# Patient Record
Sex: Female | Born: 1940 | Race: White | Hispanic: No | Marital: Married | Smoking: Never smoker
Health system: Southern US, Community
[De-identification: ages and names within clinical notes are randomized; demographics above are authoritative.]

## PROBLEM LIST (undated history)

## (undated) DIAGNOSIS — E079 Disorder of thyroid, unspecified: Secondary | ICD-10-CM

## (undated) DIAGNOSIS — R112 Nausea with vomiting, unspecified: Secondary | ICD-10-CM

## (undated) DIAGNOSIS — IMO0002 Reserved for concepts with insufficient information to code with codable children: Secondary | ICD-10-CM

## (undated) DIAGNOSIS — I10 Essential (primary) hypertension: Secondary | ICD-10-CM

## (undated) DIAGNOSIS — T884XXA Failed or difficult intubation, initial encounter: Secondary | ICD-10-CM

## (undated) DIAGNOSIS — I2510 Atherosclerotic heart disease of native coronary artery without angina pectoris: Secondary | ICD-10-CM

## (undated) DIAGNOSIS — M1990 Unspecified osteoarthritis, unspecified site: Secondary | ICD-10-CM

## (undated) DIAGNOSIS — N393 Stress incontinence (female) (male): Secondary | ICD-10-CM

## (undated) DIAGNOSIS — R7303 Prediabetes: Secondary | ICD-10-CM

## (undated) DIAGNOSIS — E039 Hypothyroidism, unspecified: Secondary | ICD-10-CM

## (undated) DIAGNOSIS — J449 Chronic obstructive pulmonary disease, unspecified: Secondary | ICD-10-CM

## (undated) DIAGNOSIS — K219 Gastro-esophageal reflux disease without esophagitis: Secondary | ICD-10-CM

## (undated) DIAGNOSIS — T8859XA Other complications of anesthesia, initial encounter: Secondary | ICD-10-CM

## (undated) HISTORY — PX: TUBAL LIGATION: SHX77

## (undated) HISTORY — PX: CHOLECYSTECTOMY: SHX55

## (undated) HISTORY — PX: ABDOMINAL HYSTERECTOMY: SHX81

## (undated) HISTORY — DX: Reserved for concepts with insufficient information to code with codable children: IMO0002

## (undated) HISTORY — PX: WRIST SURGERY: SHX841

## (undated) HISTORY — PX: HAND SURGERY: SHX662

## (undated) HISTORY — DX: Hypothyroidism, unspecified: E03.9

## (undated) HISTORY — PX: DILATION AND CURETTAGE OF UTERUS: SHX78

## (undated) HISTORY — PX: ARTHROSCOPY KNEE W/ DRILLING: SUR92

## (undated) HISTORY — DX: Stress incontinence (female) (male): N39.3

## (undated) HISTORY — PX: CATARACT EXTRACTION: SUR2

## (undated) HISTORY — DX: Prediabetes: R73.03

---

## 2001-10-27 ENCOUNTER — Encounter: Payer: Self-pay | Admitting: Family Medicine

## 2001-10-27 ENCOUNTER — Ambulatory Visit (HOSPITAL_COMMUNITY): Admission: RE | Admit: 2001-10-27 | Discharge: 2001-10-27 | Payer: Self-pay | Admitting: Family Medicine

## 2001-11-14 ENCOUNTER — Other Ambulatory Visit: Admission: RE | Admit: 2001-11-14 | Discharge: 2001-11-14 | Payer: Self-pay | Admitting: Obstetrics and Gynecology

## 2002-10-30 ENCOUNTER — Ambulatory Visit (HOSPITAL_COMMUNITY): Admission: RE | Admit: 2002-10-30 | Discharge: 2002-10-30 | Payer: Self-pay | Admitting: Obstetrics and Gynecology

## 2002-10-30 ENCOUNTER — Encounter: Payer: Self-pay | Admitting: Obstetrics and Gynecology

## 2003-06-06 ENCOUNTER — Encounter: Payer: Self-pay | Admitting: Family Medicine

## 2003-06-06 ENCOUNTER — Ambulatory Visit (HOSPITAL_COMMUNITY): Admission: RE | Admit: 2003-06-06 | Discharge: 2003-06-06 | Payer: Self-pay | Admitting: Family Medicine

## 2003-06-20 ENCOUNTER — Ambulatory Visit (HOSPITAL_COMMUNITY): Admission: RE | Admit: 2003-06-20 | Discharge: 2003-06-20 | Payer: Self-pay | Admitting: General Surgery

## 2003-09-14 ENCOUNTER — Ambulatory Visit (HOSPITAL_COMMUNITY): Admission: RE | Admit: 2003-09-14 | Discharge: 2003-09-14 | Payer: Self-pay | Admitting: Family Medicine

## 2003-11-02 ENCOUNTER — Ambulatory Visit (HOSPITAL_COMMUNITY): Admission: RE | Admit: 2003-11-02 | Discharge: 2003-11-02 | Payer: Self-pay | Admitting: Family Medicine

## 2004-12-17 ENCOUNTER — Ambulatory Visit (HOSPITAL_COMMUNITY): Admission: RE | Admit: 2004-12-17 | Discharge: 2004-12-17 | Payer: Self-pay | Admitting: Obstetrics and Gynecology

## 2005-07-06 ENCOUNTER — Ambulatory Visit (HOSPITAL_COMMUNITY): Admission: RE | Admit: 2005-07-06 | Discharge: 2005-07-06 | Payer: Self-pay | Admitting: Family Medicine

## 2005-12-22 ENCOUNTER — Ambulatory Visit (HOSPITAL_COMMUNITY): Admission: RE | Admit: 2005-12-22 | Discharge: 2005-12-22 | Payer: Self-pay | Admitting: Family Medicine

## 2006-05-03 ENCOUNTER — Ambulatory Visit (HOSPITAL_COMMUNITY): Admission: RE | Admit: 2006-05-03 | Discharge: 2006-05-03 | Payer: Self-pay | Admitting: Family Medicine

## 2006-12-27 ENCOUNTER — Ambulatory Visit (HOSPITAL_COMMUNITY): Admission: RE | Admit: 2006-12-27 | Discharge: 2006-12-27 | Payer: Self-pay | Admitting: Obstetrics and Gynecology

## 2007-11-15 ENCOUNTER — Ambulatory Visit (HOSPITAL_COMMUNITY): Admission: RE | Admit: 2007-11-15 | Discharge: 2007-11-15 | Payer: Self-pay | Admitting: Family Medicine

## 2007-12-28 ENCOUNTER — Ambulatory Visit (HOSPITAL_COMMUNITY): Admission: RE | Admit: 2007-12-28 | Discharge: 2007-12-28 | Payer: Self-pay | Admitting: Obstetrics and Gynecology

## 2008-12-28 ENCOUNTER — Ambulatory Visit (HOSPITAL_COMMUNITY): Admission: RE | Admit: 2008-12-28 | Discharge: 2008-12-28 | Payer: Self-pay | Admitting: Family Medicine

## 2009-12-30 ENCOUNTER — Ambulatory Visit (HOSPITAL_COMMUNITY): Admission: RE | Admit: 2009-12-30 | Discharge: 2009-12-30 | Payer: Self-pay | Admitting: Family Medicine

## 2010-04-04 ENCOUNTER — Ambulatory Visit (HOSPITAL_COMMUNITY): Admission: RE | Admit: 2010-04-04 | Discharge: 2010-04-04 | Payer: Self-pay | Admitting: Family Medicine

## 2010-09-29 ENCOUNTER — Ambulatory Visit (HOSPITAL_COMMUNITY)
Admission: RE | Admit: 2010-09-29 | Discharge: 2010-09-29 | Payer: Self-pay | Source: Home / Self Care | Attending: Family Medicine | Admitting: Family Medicine

## 2010-11-01 ENCOUNTER — Ambulatory Visit (HOSPITAL_COMMUNITY)
Admission: EM | Admit: 2010-11-01 | Discharge: 2010-11-01 | Disposition: A | Discharge status: Home / Self Care | Payer: Medicare Other | Source: Ambulatory Visit | Attending: Family Medicine | Admitting: Family Medicine

## 2010-11-01 ENCOUNTER — Other Ambulatory Visit (HOSPITAL_COMMUNITY): Payer: Self-pay | Admitting: Family Medicine

## 2010-11-01 ENCOUNTER — Ambulatory Visit (HOSPITAL_COMMUNITY)
Admission: RE | Admit: 2010-11-01 | Discharge: 2010-11-01 | Disposition: A | Discharge status: Home / Self Care | Payer: Medicare Other | Source: Ambulatory Visit | Admitting: Family Medicine

## 2010-11-01 DIAGNOSIS — W108XXA Fall (on) (from) other stairs and steps, initial encounter: Secondary | ICD-10-CM | POA: Insufficient documentation

## 2010-11-01 DIAGNOSIS — M8580 Other specified disorders of bone density and structure, unspecified site: Secondary | ICD-10-CM

## 2010-11-01 DIAGNOSIS — W19XXXA Unspecified fall, initial encounter: Secondary | ICD-10-CM

## 2010-11-01 DIAGNOSIS — S8990XA Unspecified injury of unspecified lower leg, initial encounter: Secondary | ICD-10-CM | POA: Insufficient documentation

## 2010-11-01 DIAGNOSIS — M25569 Pain in unspecified knee: Secondary | ICD-10-CM | POA: Insufficient documentation

## 2010-11-01 DIAGNOSIS — Z139 Encounter for screening, unspecified: Secondary | ICD-10-CM

## 2010-12-26 ENCOUNTER — Ambulatory Visit (HOSPITAL_COMMUNITY)
Admit: 2010-12-26 | Discharge: 2010-12-26 | Disposition: A | Discharge status: Home / Self Care | Payer: Medicare Other | Source: Ambulatory Visit | Attending: Obstetrics & Gynecology | Admitting: Obstetrics & Gynecology

## 2010-12-26 ENCOUNTER — Encounter (HOSPITAL_COMMUNITY): Payer: Self-pay

## 2010-12-26 DIAGNOSIS — M818 Other osteoporosis without current pathological fracture: Secondary | ICD-10-CM | POA: Insufficient documentation

## 2010-12-26 HISTORY — DX: Essential (primary) hypertension: I10

## 2011-01-02 ENCOUNTER — Ambulatory Visit (HOSPITAL_COMMUNITY)
Admit: 2011-01-02 | Discharge: 2011-01-02 | Disposition: A | Discharge status: Home / Self Care | Payer: Medicare Other | Source: Ambulatory Visit | Attending: Obstetrics and Gynecology | Admitting: Obstetrics and Gynecology

## 2011-01-02 DIAGNOSIS — Z1231 Encounter for screening mammogram for malignant neoplasm of breast: Secondary | ICD-10-CM | POA: Insufficient documentation

## 2011-01-23 NOTE — Procedures (Signed)
NAME:  April Lang, April Lang NO.:  [id number]   MEDICAL RECORD NO.:  [id number]          PATIENT TYPE:  OUT   LOCATION:  RAD                           FACILITY:  [facility]   PHYSICIAN:  Nicki Guadalajara, M.D.     DATE OF BIRTH:  June 03, 1941   DATE OF PROCEDURE:  05/03/2006  DATE OF DISCHARGE:  05/03/2006                                  ECHOCARDIOGRAM   INDICATION:  This study is performed in this 70 year old female to evaluate  systolic murmur.   1. Technically this was an adequate M-mode two-dimensional comprehensive      Doppler echocardiogram.  2. Left ventricular wall thickness was upper normal in size, measuring 1.1      cm.  Left ventricular end-diastolic and end-systolic dimensions were      normal at 4.6 and 2.9 cm, respectively.  Systolic and diastolic      function were normal.  Ejection fraction estimated is 55-60%.  There      were no segmental wall motion abnormalities.  3. Left atrium was upper normal at 3.9 cm.  Right atrium was normal.      Right ventricle had normal size and function.  4. Aortic root dimension was normal at 2.8 cm.  5. There was mild aortic valve thickening in a trileaflet valve with      minimal nodular sclerosis.  Peak instantaneous gradient was 8 mm.  Mean      gradient was 4 mm.  Estimated aortic valve are 2.3 sqcm.  This was felt      more compatible with aortic sclerosis rather than stenosis.  There was      no AR.  6. There was suggestion of minimal mitral annular calcification.  Mitral      valve closure was flat without frank prolapse.  There was no      significant MR.  7. Tricuspid valve appeared structurally normal.  8. Pulmonic valve appeared structurally normal.  9. There were no intramyocardial masses, thrombi or effusions seen.   IMPRESSION:  Technically this is an adequate echo Doppler study  demonstrating upper normal/borderline concentric left ventricular  hypertrophy with normal systolic and diastolic function.  There was  mild  focal nodular thickening of a trileaflet aortic valve without significant  stenosis or insufficiency.  There was minimal mitral annular calcification  with flat mitral valve closure without frank mitral valve prolapse or mitral  insufficiency.           ______________________________  Nicki Guadalajara, M.D.     TK/MEDQ  D:  05/04/2006  T:  05/05/2006  Job:  [id number]

## 2011-01-23 NOTE — Op Note (Signed)
NAME:  April Lang, April Lang                         ACCOUNT NO.:  [id number]   MEDICAL RECORD NO.:  [id number]                   PATIENT TYPE:  AMB   LOCATION:  DAY                                  FACILITY:  [facility]   PHYSICIAN:  Dalia Heading, M.D.               DATE OF BIRTH:  01/22/1941   DATE OF PROCEDURE:  06/20/2003  DATE OF DISCHARGE:                                 OPERATIVE REPORT   PREOPERATIVE DIAGNOSIS:  Cholecystitis and cholelithiasis.   POSTOPERATIVE DIAGNOSIS:  Cholecystitis and cholelithiasis.   PROCEDURE:  Laparoscopic cholecystectomy.   SURGEON:  Dalia Heading, M.D.   ASSISTANT:  Bernerd Limbo. Leona Carry, M.D.   ANESTHESIA:  General endotracheal.   INDICATIONS FOR PROCEDURE:  The patient is a 70 year old white female who  was referred for evaluation and treatment of biliary colic secondary to  cholelithiasis.  The risks and benefits of the procedure, including  bleeding, infection, hepatobiliary injury, and the possibility of an open  procedure were fully explained to the patient who gave informed consent.   DESCRIPTION OF PROCEDURE:  The patient was placed in the supine position.  After induction of general endotracheal anesthesia, the abdomen was prepped  and draped using the usual sterile technique with Betadine.  Surgical site  confirmation was performed.   A supraumbilical incision was made down to the fascia.  A Veress needle was  introduced into the abdominal cavity, and confirmation of placement was done  using the saline drop test.  The abdomen was then insufflated to 16 mmHg  pressure.  An 11-mm trocar was introduced into the abdominal cavity under  direct visualization without difficulty.  The patient was placed in the  reverse Trendelenburg position, and an additional 11-mm trocar was placed in  the epigastric region and 5-mm trocars were placed in the right upper  quadrant and right flank regions.  The liver was inspected and noted to be  within  normal limits.  The gallbladder was retracted superiorly and  laterally.  The dissection was begun around the infundibulum of the  gallbladder.  The cystic duct was first identified.  Its juncture to the  infundibulum was fully identified.  Endoclips were placed proximally and  distally on the cystic duct, and the cystic duct was divided.  This was  likewise done on the cystic artery.  The gallbladder was then freed away  from the gallbladder fossa using Bovie electrocautery.  The gallbladder was  delivered through the epigastric trocar site using an EndoCatch bag.  The  gallbladder fossa was inspected, and no abnormal bleeding or bile leakage  was noted.  Surgicel was placed in the gallbladder fossa.  The subhepatic  space as well as right hepatic gutter were evacuated of all fluid and air  prior to removal of the trocars.   All wounds were irrigated with normal saline.  All wounds were injected with  0.5% Sensorcaine.  The supraumbilical fascia was reapproximated using an 0  Vicryl interrupted suture.  All skin incisions were closed using staples.  Betadine ointment and dry sterile dressings were applied.   All tape and needle counts were correct at the end of the procedure.  The  patient was extubated in the operating room and went back to the recovery  room awake and in stable condition.   COMPLICATIONS:  None.   SPECIMENS:  Gallbladder.   ESTIMATED BLOOD LOSS:  Minimal.      ___________________________________________                                            Dalia Heading, M.D.   MAJ/MEDQ  D:  06/20/2003  T:  06/20/2003  Job:  [id number]   cc:   Patrica Duel, M.D.  [address]  [place]  [place] [address]  Fax: [phone]

## 2011-01-23 NOTE — H&P (Signed)
NAME:  April Lang, April Lang                         ACCOUNT NO.:  [id number]   MEDICAL RECORD NO.:  [id number]                   PATIENT TYPE:  OUT   LOCATION:  RAD                                  FACILITY:  [facility]   PHYSICIAN:  Dalia Heading, M.D.               DATE OF BIRTH:  [birth year]   DATE OF ADMISSION:  DATE OF DISCHARGE:                                HISTORY & PHYSICAL   CHIEF COMPLAINT:  Biliary colic secondary to cholelithiasis.   HISTORY OF PRESENT ILLNESS:  The patient is a 70 year old white female who  is referred for evaluation and treatment of biliary colic secondary to  cholecystitis.  She is known to have intermittent episodes of right flank  pain, postprandial discomfort after a fatty meal, and bloating for the past  two weeks.  She has had two episodes.  Some indigestion has been noted.  No  fever, chills, or jaundice have been noted.   PAST MEDICAL HISTORY:  1. Hypothyroidism.  2. Extrinsic allergies.   PAST SURGICAL HISTORY:  1. Hysterectomy.  2. Torn ligament in left arm.   CURRENT MEDICATIONS:  1. Premarin.  2. Synthroid.  3. Advair p.r.n.   ALLERGIES:  CODEINE.   REVIEW OF SYSTEMS:  The patient denies drinking or smoking.  She denies ever  experiencing chest pain, MI, leg swelling, CVA, diabetes mellitus.  She  denies any bleeding disorders.   PHYSICAL EXAMINATION:  GENERAL:  The patient is a well-developed, well-  nourished white female in no acute distress.  VITAL SIGNS:  She is afebrile, vital signs are stable.  HEENT:  No scleral icterus.  LUNGS:  Clear to auscultation with equal breath sounds bilaterally.  HEART:  Regular rate and rhythm without S3, S4, murmurs.  ABDOMEN:  Soft, nontender, nondistended, no hepatosplenomegaly, masses, or  hernias are identified.   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  The  common bile duct is in the upper limits of normal.  Liver enzyme tests are  within normal limits.   IMPRESSION:  1. Biliary colic.  2. Cholelithiasis.   PLAN:  The patient is scheduled for a laparoscopic cholecystectomy on  June 20, 2003.  The risks and benefits of the procedure including  bleeding, infection, hepatobiliary injury, the possible need for an open  procedure were fully explained to the patient, who gave informed consent.  She was placed on a Zithromax Dosepak due to a sore throat and minimal  cough.     ___________________________________________                                         Dalia Heading, M.D.   MAJ/MEDQ  D:  06/12/2003  T:  06/12/2003  Job:  [id number]   cc:   Patrica Duel, M.D.  [address]  [place]  [place] [address]  Fax: [phone]

## 2011-05-19 ENCOUNTER — Encounter (HOSPITAL_COMMUNITY): Payer: Self-pay | Admitting: Emergency Medicine

## 2011-05-19 ENCOUNTER — Emergency Department (HOSPITAL_COMMUNITY)
Admission: EM | Admit: 2011-05-19 | Discharge: 2011-05-19 | Disposition: A | Discharge status: Home / Self Care | Payer: Medicare Other | Attending: Emergency Medicine | Admitting: Emergency Medicine

## 2011-05-19 DIAGNOSIS — T6391XA Toxic effect of contact with unspecified venomous animal, accidental (unintentional), initial encounter: Secondary | ICD-10-CM | POA: Insufficient documentation

## 2011-05-19 DIAGNOSIS — I10 Essential (primary) hypertension: Secondary | ICD-10-CM | POA: Insufficient documentation

## 2011-05-19 DIAGNOSIS — J4489 Other specified chronic obstructive pulmonary disease: Secondary | ICD-10-CM | POA: Insufficient documentation

## 2011-05-19 DIAGNOSIS — T63441A Toxic effect of venom of bees, accidental (unintentional), initial encounter: Secondary | ICD-10-CM

## 2011-05-19 DIAGNOSIS — T63461A Toxic effect of venom of wasps, accidental (unintentional), initial encounter: Secondary | ICD-10-CM | POA: Insufficient documentation

## 2011-05-19 DIAGNOSIS — J449 Chronic obstructive pulmonary disease, unspecified: Secondary | ICD-10-CM | POA: Insufficient documentation

## 2011-05-19 HISTORY — DX: Unspecified osteoarthritis, unspecified site: M19.90

## 2011-05-19 HISTORY — DX: Chronic obstructive pulmonary disease, unspecified: J44.9

## 2011-05-19 HISTORY — DX: Disorder of thyroid, unspecified: E07.9

## 2011-05-19 MED ORDER — PREDNISONE 10 MG PO TABS: 20.0000 mg | ORAL_TABLET | Freq: Every day | ORAL | Status: AC

## 2011-05-19 MED ORDER — METHYLPREDNISOLONE SODIUM SUCC 125 MG IJ SOLR
125.0000 mg | Freq: Once | INTRAMUSCULAR | Status: AC
  Administered 2011-05-19: 125 mg via INTRAVENOUS

## 2011-05-19 MED ORDER — SODIUM CHLORIDE 0.9 % IV SOLN
Freq: Once | INTRAVENOUS | Status: AC
  Administered 2011-05-19: 20 mL via INTRAVENOUS

## 2011-05-19 NOTE — ED Provider Notes (Addendum)
History   Chart scribed for Hilario Quarry, MD by Enos Fling; the patient was seen in room [facility]; this patient's care was started at 2:10 PM.    CSN: [id number] Arrival date & time: 05/19/2011  1:54 PM  Chief Complaint  Patient presents with  . Insect Bite   HPI April Lang is a 70 y.o. female who presents to the Emergency Department complaining of bee sting. Pt reports bee sting to left forearm just pta; c/o redness and swelling to area. She took benadryl immediately with improvement of symptoms. Thought possible sob so took a dose of her advair (has for asthma) with complete relief. No sob currently. No throat, lip, or tongue swelling. No chest tightness. Pt reports h/o allergic reactions to insect bites resulting in large welt to bite area but no other reaction.    Past Medical History  Diagnosis Date  . Hypertension   . Arthritis   . Asthma   . COPD (chronic obstructive pulmonary disease)   . Thyroid disease     Past Surgical History  Procedure Date  . Cholecystectomy   . Abdominal hysterectomy     History reviewed. No pertinent family history.  History  Substance Use Topics  . Smoking status: Never Smoker   . Smokeless tobacco: Not on file  . Alcohol Use: No    OB History    Grav Para Term Preterm Abortions TAB SAB Ect Mult Living                 Previous Medications   No medications on file     Allergies as of 05/19/2011 - Review Complete 12/26/2010  Allergen Reaction Noted  . Codeine  12/26/2010      Review of Systems  Constitutional: Negative for diaphoresis.  HENT: Negative for facial swelling and trouble swallowing.   Eyes: Negative for itching.  Respiratory: Negative for chest tightness, shortness of breath and wheezing.   Cardiovascular: Negative for palpitations.  Skin: Positive for rash.    Physical Exam  BP 174/67  Pulse 66  Temp(Src) 97.7 F (36.5 C) (Oral)  Resp 20  Ht 5\' 5"  (1.651 m)  Wt 165 lb (74.844 kg)  BMI  27.46 kg/m2  SpO2 100%  Physical Exam  Constitutional: She is oriented to person, place, and time. She appears well-developed and well-nourished. No distress.  HENT:  Head: Normocephalic and atraumatic.  Mouth/Throat: Oropharynx is clear and moist.  Eyes: Conjunctivae are normal.  Neck: Normal range of motion. Neck supple.  Cardiovascular: Normal rate.   Pulmonary/Chest: Effort normal and breath sounds normal. No respiratory distress. She has no wheezes.  Musculoskeletal: Normal range of motion.  Neurological: She is alert and oriented to person, place, and time.  Skin: Skin is warm and dry. Rash (area of induration to left anterior forearm; ice pack resting on area) noted.  Psychiatric: She has a normal mood and affect.    ED Course  Procedures  OTHER DATA REVIEWED: Nursing notes and vital signs reviewed.   ED COURSE: 2:39 PM - swelling and redness improved after meds; pt feels much better   MDM: Patient has not had prior anaphylactic reaction. She has had large localized reactions in the past. She's not having any difficulty swallowing or breathing. She feels improved here after having Solu-Medrol and Benadryl. She will be discharged to home with instructions return or she has any difficulty swallowing or breathing.   IMPRESSION: No diagnosis found.   PLAN: Discharge All results reviewed and discussed  with pt, questions answered, pt agreeable with plan.   CONDITION ON DISCHARGE: Improved   MEDS GIVEN IN ED: Medications - No data to display    SCRIBE ATTESTATION: I personally performed the services described in this documentation, which was scribed in my presence. The recorded information has been reviewed and considered.   Hilario Quarry, MD 05/19/11 1459  Hilario Quarry, MD 05/20/11 [id number]

## 2011-05-19 NOTE — ED Notes (Signed)
Reddened edematous area along mid left forearm where pt. States she was stung by a yellow jacket in the past hour--also a second raised area (smaller) along left a/c where she states she sustained a 2nd sting--History for reaction to stings in the past requiring medical attention and injections.  C/o feeling SOA--Lungs CTA---no cyanosis--no air hunger, no nasal flaring

## 2011-05-19 NOTE — ED Notes (Signed)
Reports feeling much improved and no SOA.

## 2011-05-19 NOTE — ED Notes (Signed)
I.V. Removed right a/c---intact and no redness or swelling along site.  Sterile band-aid to site.

## 2011-05-19 NOTE — ED Notes (Signed)
ERROR---IV of N.S. And Solu Medrol 125 MG IVP were given at 13:55 as an emergency order

## 2011-05-19 NOTE — ED Notes (Signed)
C/o Yellow Jacket sting X's to left forearm about one hour prior to arrival.  C/o feeling SOA

## 2011-11-17 DIAGNOSIS — E039 Hypothyroidism, unspecified: Secondary | ICD-10-CM | POA: Diagnosis not present

## 2011-11-17 DIAGNOSIS — I10 Essential (primary) hypertension: Secondary | ICD-10-CM | POA: Diagnosis not present

## 2011-11-17 DIAGNOSIS — Z79899 Other long term (current) drug therapy: Secondary | ICD-10-CM | POA: Diagnosis not present

## 2011-11-17 DIAGNOSIS — Z6827 Body mass index (BMI) 27.0-27.9, adult: Secondary | ICD-10-CM | POA: Diagnosis not present

## 2011-11-17 DIAGNOSIS — R5383 Other fatigue: Secondary | ICD-10-CM | POA: Diagnosis not present

## 2011-11-17 DIAGNOSIS — M159 Polyosteoarthritis, unspecified: Secondary | ICD-10-CM | POA: Diagnosis not present

## 2011-11-23 ENCOUNTER — Other Ambulatory Visit (HOSPITAL_COMMUNITY): Payer: Self-pay | Admitting: Family Medicine

## 2011-11-23 DIAGNOSIS — Z139 Encounter for screening, unspecified: Secondary | ICD-10-CM

## 2011-12-30 DIAGNOSIS — M545 Low back pain: Secondary | ICD-10-CM | POA: Diagnosis not present

## 2012-01-04 ENCOUNTER — Ambulatory Visit (HOSPITAL_COMMUNITY)
Admission: RE | Admit: 2012-01-04 | Discharge: 2012-01-04 | Disposition: A | Discharge status: Home / Self Care | Payer: Medicare Other | Source: Ambulatory Visit | Attending: Family Medicine | Admitting: Family Medicine

## 2012-01-04 DIAGNOSIS — Z1231 Encounter for screening mammogram for malignant neoplasm of breast: Secondary | ICD-10-CM | POA: Diagnosis not present

## 2012-01-04 DIAGNOSIS — Z139 Encounter for screening, unspecified: Secondary | ICD-10-CM

## 2012-02-19 DIAGNOSIS — M7710 Lateral epicondylitis, unspecified elbow: Secondary | ICD-10-CM | POA: Diagnosis not present

## 2012-04-18 DIAGNOSIS — E039 Hypothyroidism, unspecified: Secondary | ICD-10-CM | POA: Diagnosis not present

## 2012-04-18 DIAGNOSIS — I10 Essential (primary) hypertension: Secondary | ICD-10-CM | POA: Diagnosis not present

## 2012-04-18 DIAGNOSIS — R5381 Other malaise: Secondary | ICD-10-CM | POA: Diagnosis not present

## 2012-04-18 DIAGNOSIS — Z6828 Body mass index (BMI) 28.0-28.9, adult: Secondary | ICD-10-CM | POA: Diagnosis not present

## 2012-04-21 DIAGNOSIS — R5381 Other malaise: Secondary | ICD-10-CM | POA: Diagnosis not present

## 2012-05-05 DIAGNOSIS — E782 Mixed hyperlipidemia: Secondary | ICD-10-CM | POA: Diagnosis not present

## 2012-05-05 DIAGNOSIS — R079 Chest pain, unspecified: Secondary | ICD-10-CM | POA: Diagnosis not present

## 2012-05-05 DIAGNOSIS — I10 Essential (primary) hypertension: Secondary | ICD-10-CM | POA: Diagnosis not present

## 2012-05-05 DIAGNOSIS — R0989 Other specified symptoms and signs involving the circulatory and respiratory systems: Secondary | ICD-10-CM | POA: Diagnosis not present

## 2012-05-20 DIAGNOSIS — L259 Unspecified contact dermatitis, unspecified cause: Secondary | ICD-10-CM | POA: Diagnosis not present

## 2012-05-20 DIAGNOSIS — Z6827 Body mass index (BMI) 27.0-27.9, adult: Secondary | ICD-10-CM | POA: Diagnosis not present

## 2012-05-23 DIAGNOSIS — R0602 Shortness of breath: Secondary | ICD-10-CM | POA: Diagnosis not present

## 2012-05-23 DIAGNOSIS — E782 Mixed hyperlipidemia: Secondary | ICD-10-CM | POA: Diagnosis not present

## 2012-05-23 DIAGNOSIS — Z79899 Other long term (current) drug therapy: Secondary | ICD-10-CM | POA: Diagnosis not present

## 2012-05-27 DIAGNOSIS — R0602 Shortness of breath: Secondary | ICD-10-CM | POA: Diagnosis not present

## 2012-05-27 DIAGNOSIS — I10 Essential (primary) hypertension: Secondary | ICD-10-CM | POA: Diagnosis not present

## 2012-05-27 DIAGNOSIS — E782 Mixed hyperlipidemia: Secondary | ICD-10-CM | POA: Diagnosis not present

## 2012-05-30 DIAGNOSIS — Z6827 Body mass index (BMI) 27.0-27.9, adult: Secondary | ICD-10-CM | POA: Diagnosis not present

## 2012-05-30 DIAGNOSIS — I10 Essential (primary) hypertension: Secondary | ICD-10-CM | POA: Diagnosis not present

## 2012-05-30 DIAGNOSIS — L259 Unspecified contact dermatitis, unspecified cause: Secondary | ICD-10-CM | POA: Diagnosis not present

## 2012-05-30 DIAGNOSIS — E785 Hyperlipidemia, unspecified: Secondary | ICD-10-CM | POA: Diagnosis not present

## 2012-05-30 DIAGNOSIS — Z23 Encounter for immunization: Secondary | ICD-10-CM | POA: Diagnosis not present

## 2012-06-27 DIAGNOSIS — J069 Acute upper respiratory infection, unspecified: Secondary | ICD-10-CM | POA: Diagnosis not present

## 2012-06-27 DIAGNOSIS — J209 Acute bronchitis, unspecified: Secondary | ICD-10-CM | POA: Diagnosis not present

## 2012-06-27 DIAGNOSIS — J45909 Unspecified asthma, uncomplicated: Secondary | ICD-10-CM | POA: Diagnosis not present

## 2012-07-06 DIAGNOSIS — E782 Mixed hyperlipidemia: Secondary | ICD-10-CM | POA: Diagnosis not present

## 2012-07-06 DIAGNOSIS — Z79899 Other long term (current) drug therapy: Secondary | ICD-10-CM | POA: Diagnosis not present

## 2012-09-16 DIAGNOSIS — E785 Hyperlipidemia, unspecified: Secondary | ICD-10-CM | POA: Diagnosis not present

## 2012-09-16 DIAGNOSIS — E039 Hypothyroidism, unspecified: Secondary | ICD-10-CM | POA: Diagnosis not present

## 2012-09-16 DIAGNOSIS — J0100 Acute maxillary sinusitis, unspecified: Secondary | ICD-10-CM | POA: Diagnosis not present

## 2012-09-16 DIAGNOSIS — J209 Acute bronchitis, unspecified: Secondary | ICD-10-CM | POA: Diagnosis not present

## 2012-09-19 DIAGNOSIS — E038 Other specified hypothyroidism: Secondary | ICD-10-CM | POA: Diagnosis not present

## 2012-09-30 DIAGNOSIS — I10 Essential (primary) hypertension: Secondary | ICD-10-CM | POA: Diagnosis not present

## 2012-09-30 DIAGNOSIS — J209 Acute bronchitis, unspecified: Secondary | ICD-10-CM | POA: Diagnosis not present

## 2012-09-30 DIAGNOSIS — Z6828 Body mass index (BMI) 28.0-28.9, adult: Secondary | ICD-10-CM | POA: Diagnosis not present

## 2012-10-17 DIAGNOSIS — R0602 Shortness of breath: Secondary | ICD-10-CM | POA: Diagnosis not present

## 2012-10-17 DIAGNOSIS — I10 Essential (primary) hypertension: Secondary | ICD-10-CM | POA: Diagnosis not present

## 2012-10-17 DIAGNOSIS — E782 Mixed hyperlipidemia: Secondary | ICD-10-CM | POA: Diagnosis not present

## 2012-11-30 ENCOUNTER — Other Ambulatory Visit (HOSPITAL_COMMUNITY): Payer: Self-pay | Admitting: Internal Medicine

## 2012-11-30 ENCOUNTER — Ambulatory Visit (HOSPITAL_COMMUNITY)
Admission: RE | Admit: 2012-11-30 | Discharge: 2012-11-30 | Disposition: A | Discharge status: Home / Self Care | Payer: Medicare Other | Source: Ambulatory Visit | Attending: Internal Medicine | Admitting: Internal Medicine

## 2012-11-30 DIAGNOSIS — R059 Cough, unspecified: Secondary | ICD-10-CM | POA: Insufficient documentation

## 2012-11-30 DIAGNOSIS — R05 Cough: Secondary | ICD-10-CM

## 2012-11-30 DIAGNOSIS — E039 Hypothyroidism, unspecified: Secondary | ICD-10-CM | POA: Diagnosis not present

## 2012-11-30 DIAGNOSIS — J209 Acute bronchitis, unspecified: Secondary | ICD-10-CM | POA: Diagnosis not present

## 2012-11-30 DIAGNOSIS — Z6829 Body mass index (BMI) 29.0-29.9, adult: Secondary | ICD-10-CM | POA: Diagnosis not present

## 2012-11-30 DIAGNOSIS — I10 Essential (primary) hypertension: Secondary | ICD-10-CM | POA: Diagnosis not present

## 2012-12-19 ENCOUNTER — Other Ambulatory Visit (HOSPITAL_COMMUNITY): Payer: Self-pay | Admitting: Internal Medicine

## 2012-12-19 DIAGNOSIS — Z139 Encounter for screening, unspecified: Secondary | ICD-10-CM

## 2013-01-05 ENCOUNTER — Ambulatory Visit (HOSPITAL_COMMUNITY)
Admission: RE | Admit: 2013-01-05 | Discharge: 2013-01-05 | Disposition: A | Discharge status: Home / Self Care | Payer: Medicare Other | Source: Ambulatory Visit | Attending: Internal Medicine | Admitting: Internal Medicine

## 2013-01-05 DIAGNOSIS — Z1231 Encounter for screening mammogram for malignant neoplasm of breast: Secondary | ICD-10-CM | POA: Insufficient documentation

## 2013-01-05 DIAGNOSIS — Z139 Encounter for screening, unspecified: Secondary | ICD-10-CM

## 2013-01-06 DIAGNOSIS — H04129 Dry eye syndrome of unspecified lacrimal gland: Secondary | ICD-10-CM | POA: Diagnosis not present

## 2013-01-06 DIAGNOSIS — Z961 Presence of intraocular lens: Secondary | ICD-10-CM | POA: Diagnosis not present

## 2013-01-06 DIAGNOSIS — H18419 Arcus senilis, unspecified eye: Secondary | ICD-10-CM | POA: Diagnosis not present

## 2013-01-18 ENCOUNTER — Encounter: Payer: Self-pay | Admitting: *Deleted

## 2013-01-18 DIAGNOSIS — I10 Essential (primary) hypertension: Secondary | ICD-10-CM | POA: Insufficient documentation

## 2013-01-18 DIAGNOSIS — J45909 Unspecified asthma, uncomplicated: Secondary | ICD-10-CM | POA: Insufficient documentation

## 2013-01-19 ENCOUNTER — Ambulatory Visit (INDEPENDENT_AMBULATORY_CARE_PROVIDER_SITE_OTHER): Payer: Medicare Other | Admitting: Adult Health

## 2013-01-19 ENCOUNTER — Encounter: Payer: Self-pay | Admitting: Adult Health

## 2013-01-19 ENCOUNTER — Other Ambulatory Visit: Payer: Self-pay | Admitting: *Deleted

## 2013-01-19 VITALS — BP 140/82 | HR 78 | Ht 64.0 in | Wt 164.0 lb

## 2013-01-19 DIAGNOSIS — I10 Essential (primary) hypertension: Secondary | ICD-10-CM

## 2013-01-19 DIAGNOSIS — Z01419 Encounter for gynecological examination (general) (routine) without abnormal findings: Secondary | ICD-10-CM | POA: Diagnosis not present

## 2013-01-19 DIAGNOSIS — IMO0002 Reserved for concepts with insufficient information to code with codable children: Secondary | ICD-10-CM

## 2013-01-19 DIAGNOSIS — E039 Hypothyroidism, unspecified: Secondary | ICD-10-CM | POA: Diagnosis not present

## 2013-01-19 DIAGNOSIS — N393 Stress incontinence (female) (male): Secondary | ICD-10-CM

## 2013-01-19 DIAGNOSIS — Z1212 Encounter for screening for malignant neoplasm of rectum: Secondary | ICD-10-CM

## 2013-01-19 HISTORY — DX: Reserved for concepts with insufficient information to code with codable children: IMO0002

## 2013-01-19 HISTORY — DX: Stress incontinence (female) (male): N39.3

## 2013-01-19 LAB — POCT URINALYSIS DIPSTICK
Blood, UA: NEGATIVE
Leukocytes, UA: NEGATIVE
Nitrite, UA: NEGATIVE
Protein, UA: NEGATIVE

## 2013-01-19 LAB — T3 UPTAKE: T3 Uptake: 37.5 % — ABNORMAL HIGH (ref 22.5–37.0)

## 2013-01-19 LAB — HEMOCCULT GUIAC POC 1CARD (OFFICE)

## 2013-01-19 LAB — T4, FREE: Free T4: 1.89 ng/dL — ABNORMAL HIGH (ref 0.80–1.80)

## 2013-01-19 MED ORDER — HYDROCHLOROTHIAZIDE 25 MG PO TABS: 25.0000 mg | ORAL_TABLET | Freq: Every day | ORAL | Status: DC

## 2013-01-19 NOTE — Progress Notes (Signed)
Patient ID: April Lang, female   DOB: 1940/11/04, 72 y.o.   MRN: [id number] History of Present Illness: April Lang is a 72 year old white female in for her physical. She is having SUI.  Current Medications, Allergies, Past Medical History, Past Surgical History, Family History and Social History were reviewed in [name] record.    Review of Systems: Patient denies any headaches, blurred vision, chest pain, abdominal pain, problems with bowel movements.She has had shortness of breath and saw Dr. Allyson Lang and has increased left atrial filling pressures. She is complaining of SUI and pressure, and feels bloated. Has some stress, looking after uncle in nursing home.  Physical Exam:Blood pressure 140/82, pulse 78, height 5\' 4"  (1.626 m), weight 164 lb (74.39 kg). Urine was negative. General:  Well developed, well nourished, no acute distress Skin:  Warm and dry Neck:  Midline trachea, normal thyroid, no carotid bruits heard Lungs; Clear to auscultation bilaterally Breast:  No dominant palpable mass, retraction, or nipple discharge Cardiovascular: Regular rate and rhythm Abdomen:  Soft, non tender, no hepatosplenomegaly Pelvic:  External genitalia is normal in appearance for age.  The vagina is atrophic. The cervix and uterus are absent, and she has a cystocele.  No adnexal masses or tenderness noted. Rectal: Good sphincter tone, no polyps, or hemorrhoids felt.  Hemoccult negative. Has some redness and a raw spot on left cheek from her pad rubbing and moisture changes Extremities:  No swelling or varicosities noted Psych: Alert and cooperative, seems in good mood  Impression: Yearly exam, no pap Cystocele SUI Hypothyroidism Hypertension Increased left atrial filling pressures  Plan: Check TSH and thyroid profile at her request Review handouts on cystocele and SUI Return in 1-2 weeks to see April Lang regarding SUI and cystocele Physical in 1 -2 years Use zinc oxide  for moisture changes on bottom

## 2013-01-19 NOTE — Patient Instructions (Addendum)
Review handouts  Physical in 1 -2 years Sign up for my chart Return in 1-2 weeks to see Dr Despina Hidden

## 2013-01-23 ENCOUNTER — Telehealth: Payer: Self-pay | Admitting: Adult Health

## 2013-01-23 NOTE — Telephone Encounter (Signed)
Wants labs sent to Dr. Sherwood Gambler and not Dr. Phillips Odor.

## 2013-01-31 ENCOUNTER — Ambulatory Visit (INDEPENDENT_AMBULATORY_CARE_PROVIDER_SITE_OTHER): Payer: Medicare Other | Admitting: Obstetrics & Gynecology

## 2013-01-31 ENCOUNTER — Encounter: Payer: Self-pay | Admitting: Obstetrics & Gynecology

## 2013-01-31 VITALS — BP 130/80 | Temp 98.1°F | Ht 64.0 in | Wt 165.0 lb

## 2013-01-31 DIAGNOSIS — N3946 Mixed incontinence: Secondary | ICD-10-CM | POA: Diagnosis not present

## 2013-01-31 DIAGNOSIS — R319 Hematuria, unspecified: Secondary | ICD-10-CM | POA: Diagnosis not present

## 2013-01-31 DIAGNOSIS — N898 Other specified noninflammatory disorders of vagina: Secondary | ICD-10-CM

## 2013-01-31 MED ORDER — MIRABEGRON ER 25 MG PO TB24: 25.0000 mg | ORAL_TABLET | Freq: Every day | ORAL | Status: DC

## 2013-01-31 NOTE — Addendum Note (Signed)
Addended by: Richardson Chiquito on: 01/31/2013 03:41 PM   Modules accepted: Orders

## 2013-01-31 NOTE — Addendum Note (Signed)
Addended by: Richardson Chiquito on: 01/31/2013 03:46 PM   Modules accepted: Orders

## 2013-01-31 NOTE — Progress Notes (Signed)
Patient ID: April Lang, female   DOB: August 24, 1941, 72 y.o.   MRN: [id number] Previous visit noted.  Patient has mixed incontinence much worse in last 3-4 months. 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

## 2013-02-02 ENCOUNTER — Telehealth: Payer: Self-pay | Admitting: Obstetrics & Gynecology

## 2013-02-02 LAB — URINE CULTURE: Colony Count: >=100000

## 2013-02-02 MED ORDER — CIPROFLOXACIN HCL 500 MG PO TABS: 500.0000 mg | ORAL_TABLET | Freq: Two times a day (BID) | ORAL | Status: DC

## 2013-02-02 NOTE — Telephone Encounter (Signed)
+  E coli   Cipro e prescribed

## 2013-02-28 ENCOUNTER — Ambulatory Visit (INDEPENDENT_AMBULATORY_CARE_PROVIDER_SITE_OTHER): Payer: Medicare Other | Admitting: Obstetrics & Gynecology

## 2013-02-28 ENCOUNTER — Encounter: Payer: Self-pay | Admitting: Obstetrics & Gynecology

## 2013-02-28 VITALS — BP 150/70 | Wt 166.0 lb

## 2013-02-28 DIAGNOSIS — N390 Urinary tract infection, site not specified: Secondary | ICD-10-CM | POA: Diagnosis not present

## 2013-02-28 DIAGNOSIS — N3946 Mixed incontinence: Secondary | ICD-10-CM | POA: Insufficient documentation

## 2013-02-28 DIAGNOSIS — N3941 Urge incontinence: Secondary | ICD-10-CM | POA: Diagnosis not present

## 2013-02-28 LAB — POCT URINALYSIS DIPSTICK
Ketones, UA: NEGATIVE
Protein, UA: NEGATIVE

## 2013-02-28 NOTE — Progress Notes (Signed)
Patient ID: April Lang, female   DOB: 14-Nov-1940, 72 y.o.   MRN: [id number] That is in for a followup for her appointment last month I treated her with Cipro for an Escherichia coli urinary tract infection Also placed her on myrbetriq 25 mg each bedtime for her urge incontinence She has mixed incontinence with significant  stress urinary component as well  She is responded favorably to the myrbetriq and will continue that medication She is not having any complications or untoward effects  We will see her back in 6 months for followup or prior to that should she have any problems

## 2013-02-28 NOTE — Patient Instructions (Addendum)
Urinary Incontinence Your doctor wants you to have this information about urinary incontinence. This is the inability to keep urine in your body until you decide to release it. CAUSES  Prostate gland enlargement is a common cause of urinary incontinence. But there are many different causes for losing urinary control. They include:  Medicines.  Infections.  Prostate problems.  Surgery.  Neurological diseases.  Emotional factors. DIAGNOSIS  Evaluating the cause of incontinence is important in choosing the best treatment. This may require:  An ultrasound exam.  Kidney and bladder X-rays.  Cystoscopy. This is an exam of the bladder using a narrow scope. TREATMENT  For incontinent patients, normal daily hygiene and using changing pads or adult diapers regularly will prevent offensive odors and skin damage from the moisture. Changing your medicines may help control incontinence. Your caregiver may prescribe some medicines to help you regain control. Avoid caffeine. It can over-stimulate the bladder. Use the bathroom regularly. Try about every 2 to 3 hours even if you do not feel the need. Take time to empty your bladder completely. After urinating, wait a minute. Then try to urinate again. External devices used to catch urine or an indwelling urine catheter (Foley catheter) may be needed as well. Some prostate gland problems require surgery to correct. Call your caregiver for more information. Document Released: 10/01/2004 Document Revised: 11/16/2011 Document Reviewed: 09/26/2008 [employer] Patient Information 2014 [employer].  

## 2013-03-03 ENCOUNTER — Encounter: Payer: Self-pay | Admitting: Adult Health

## 2013-03-28 ENCOUNTER — Telehealth: Payer: Self-pay

## 2013-03-28 NOTE — Telephone Encounter (Signed)
CALL PT TO MAKE SURE ,PICK UP MEDICATION. PT DID PICK UP MEDICATION.

## 2013-04-12 ENCOUNTER — Other Ambulatory Visit: Payer: Self-pay

## 2013-04-21 ENCOUNTER — Encounter: Payer: Self-pay | Admitting: Cardiovascular Disease

## 2013-04-24 ENCOUNTER — Ambulatory Visit (INDEPENDENT_AMBULATORY_CARE_PROVIDER_SITE_OTHER): Payer: Medicare Other | Admitting: Cardiovascular Disease

## 2013-04-24 ENCOUNTER — Encounter: Payer: Self-pay | Admitting: Cardiovascular Disease

## 2013-04-24 VITALS — BP 146/60 | HR 63 | Ht 64.0 in | Wt 163.1 lb

## 2013-04-24 DIAGNOSIS — I10 Essential (primary) hypertension: Secondary | ICD-10-CM | POA: Diagnosis not present

## 2013-04-24 MED ORDER — LOSARTAN POTASSIUM 50 MG PO TABS: 50.0000 mg | ORAL_TABLET | Freq: Every day | ORAL | Status: DC

## 2013-04-24 NOTE — Assessment & Plan Note (Signed)
Well-controlled on current medications 

## 2013-04-24 NOTE — Progress Notes (Signed)
   04/24/2013 April Lang   [birth year]  [id number]  Primary Physician Cassell Smiles., MD Primary Cardiologist: Runell Gess MD April Lang   HPI:  The patient returns today for followup. I last saw her 6 months ago for dyspnea. She had a MET test that was negative for ischemia, an echo that showed increased filling pressures. I began her on a diuretic to which she has had a variable response. She does have an ACE cough. A recent lipid profile showed a total cholesterol of 210 with LDL 111, HDL of 71.since I saw her back in the office 10/17/12 she denies chest pain or shortness of breath.      Current Outpatient Prescriptions  Medication Sig Dispense Refill  . acetaminophen (TYLENOL) 500 MG tablet Take 500 mg by mouth as needed for pain.      . hydrochlorothiazide (HYDRODIURIL) 25 MG tablet Take 1 tablet (25 mg total) by mouth daily.  90 tablet  1  . levothyroxine (SYNTHROID, LEVOTHROID) 100 MCG tablet Take 100 mcg by mouth daily before breakfast.      . losartan (COZAAR) 50 MG tablet Take 25-50 mg by mouth daily.       No current facility-administered medications for this visit.    Allergies  Allergen Reactions  . Codeine   . Lisinopril     Cough    History   Social History  . Marital Status: Married    Spouse Name: N/A    Number of Children: N/A  . Years of Education: N/A   Occupational History  . Not on file.   Social History Main Topics  . Smoking status: Never Smoker   . Smokeless tobacco: Never Used  . Alcohol Use: No  . Drug Use: No  . Sexual Activity: Not Currently    Birth Control/ Protection: Surgical   Other Topics Concern  . Not on file   Social History Narrative  . No narrative on file     Review of Systems: General: negative for chills, fever, night sweats or weight changes.  Cardiovascular: negative for chest pain, dyspnea on exertion, edema, orthopnea, palpitations, paroxysmal nocturnal dyspnea or shortness of  breath Dermatological: negative for rash Respiratory: negative for cough or wheezing Urologic: negative for hematuria Abdominal: negative for nausea, vomiting, diarrhea, bright red blood per rectum, melena, or hematemesis Neurologic: negative for visual changes, syncope, or dizziness All other systems reviewed and are otherwise negative except as noted above.    Blood pressure 146/60, pulse 63, height 5\' 4"  (1.626 m), weight 163 lb 1.6 oz (73.982 kg).  General appearance: alert and no distress Neck: no adenopathy, no carotid bruit, no JVD, supple, symmetrical, trachea midline and thyroid not enlarged, symmetric, no tenderness/mass/nodules Lungs: clear to auscultation bilaterally Heart: regular rate and rhythm, S1, S2 normal, no murmur, click, rub or gallop Extremities: extremities normal, atraumatic, no cyanosis or edema  EKG normal sinus rhythm at 63 without ST or T wave changes  ASSESSMENT AND PLAN:   HTN (hypertension) Well-controlled on current medications      Runell Gess MD [facility], [facility] 04/24/2013 12:57 PM

## 2013-04-24 NOTE — Patient Instructions (Addendum)
Your physician wants you to follow-up in: 6 months with an extender and 12 months with Dr Berry. You will receive a reminder letter in the mail two months in advance. If you don't receive a letter, please call our office to schedule the follow-up appointment.  

## 2013-05-13 DIAGNOSIS — Z23 Encounter for immunization: Secondary | ICD-10-CM | POA: Diagnosis not present

## 2013-05-19 DIAGNOSIS — L259 Unspecified contact dermatitis, unspecified cause: Secondary | ICD-10-CM | POA: Diagnosis not present

## 2013-06-13 DIAGNOSIS — J984 Other disorders of lung: Secondary | ICD-10-CM | POA: Diagnosis not present

## 2013-06-13 DIAGNOSIS — Z6827 Body mass index (BMI) 27.0-27.9, adult: Secondary | ICD-10-CM | POA: Diagnosis not present

## 2013-06-13 DIAGNOSIS — J45909 Unspecified asthma, uncomplicated: Secondary | ICD-10-CM | POA: Diagnosis not present

## 2013-07-13 ENCOUNTER — Other Ambulatory Visit: Payer: Self-pay

## 2013-07-17 ENCOUNTER — Other Ambulatory Visit: Payer: Self-pay | Admitting: *Deleted

## 2013-07-17 MED ORDER — HYDROCHLOROTHIAZIDE 25 MG PO TABS: 25.0000 mg | ORAL_TABLET | Freq: Every day | ORAL | Status: DC

## 2013-07-17 NOTE — Telephone Encounter (Signed)
#  90 w/1 refill on HCTZ electronically.

## 2013-07-18 ENCOUNTER — Ambulatory Visit (HOSPITAL_COMMUNITY)
Admission: RE | Admit: 2013-07-18 | Discharge: 2013-07-18 | Disposition: A | Discharge status: Home / Self Care | Payer: Medicare Other | Source: Ambulatory Visit | Attending: Internal Medicine | Admitting: Internal Medicine

## 2013-07-18 ENCOUNTER — Other Ambulatory Visit (HOSPITAL_COMMUNITY): Payer: Self-pay | Admitting: Internal Medicine

## 2013-07-18 DIAGNOSIS — R0602 Shortness of breath: Secondary | ICD-10-CM | POA: Insufficient documentation

## 2013-07-18 DIAGNOSIS — R05 Cough: Secondary | ICD-10-CM

## 2013-07-18 DIAGNOSIS — Z6827 Body mass index (BMI) 27.0-27.9, adult: Secondary | ICD-10-CM | POA: Diagnosis not present

## 2013-07-18 DIAGNOSIS — Z79899 Other long term (current) drug therapy: Secondary | ICD-10-CM | POA: Diagnosis not present

## 2013-07-18 DIAGNOSIS — I10 Essential (primary) hypertension: Secondary | ICD-10-CM | POA: Diagnosis not present

## 2013-07-18 DIAGNOSIS — J45909 Unspecified asthma, uncomplicated: Secondary | ICD-10-CM | POA: Insufficient documentation

## 2013-07-18 DIAGNOSIS — E785 Hyperlipidemia, unspecified: Secondary | ICD-10-CM | POA: Diagnosis not present

## 2013-08-21 ENCOUNTER — Other Ambulatory Visit (HOSPITAL_COMMUNITY): Payer: Self-pay | Admitting: Internal Medicine

## 2013-08-21 DIAGNOSIS — R7309 Other abnormal glucose: Secondary | ICD-10-CM | POA: Diagnosis not present

## 2013-08-21 DIAGNOSIS — R079 Chest pain, unspecified: Secondary | ICD-10-CM | POA: Diagnosis not present

## 2013-08-21 DIAGNOSIS — R222 Localized swelling, mass and lump, trunk: Secondary | ICD-10-CM

## 2013-08-21 DIAGNOSIS — E871 Hypo-osmolality and hyponatremia: Secondary | ICD-10-CM | POA: Diagnosis not present

## 2013-08-21 DIAGNOSIS — Z6828 Body mass index (BMI) 28.0-28.9, adult: Secondary | ICD-10-CM | POA: Diagnosis not present

## 2013-08-21 DIAGNOSIS — I10 Essential (primary) hypertension: Secondary | ICD-10-CM | POA: Diagnosis not present

## 2013-08-23 ENCOUNTER — Ambulatory Visit (HOSPITAL_COMMUNITY)
Admission: RE | Admit: 2013-08-23 | Discharge: 2013-08-23 | Disposition: A | Discharge status: Home / Self Care | Payer: Medicare Other | Source: Ambulatory Visit | Attending: Internal Medicine | Admitting: Internal Medicine

## 2013-08-23 DIAGNOSIS — R222 Localized swelling, mass and lump, trunk: Secondary | ICD-10-CM | POA: Insufficient documentation

## 2013-08-23 DIAGNOSIS — R220 Localized swelling, mass and lump, head: Secondary | ICD-10-CM | POA: Insufficient documentation

## 2013-08-23 DIAGNOSIS — E049 Nontoxic goiter, unspecified: Secondary | ICD-10-CM | POA: Insufficient documentation

## 2013-08-23 DIAGNOSIS — R911 Solitary pulmonary nodule: Secondary | ICD-10-CM | POA: Diagnosis not present

## 2013-10-16 ENCOUNTER — Encounter (INDEPENDENT_AMBULATORY_CARE_PROVIDER_SITE_OTHER): Payer: Self-pay | Admitting: *Deleted

## 2013-10-23 ENCOUNTER — Ambulatory Visit: Payer: Medicare Other | Admitting: Cardiology

## 2013-10-25 ENCOUNTER — Ambulatory Visit (INDEPENDENT_AMBULATORY_CARE_PROVIDER_SITE_OTHER): Payer: Medicare Other | Admitting: Cardiology

## 2013-10-25 ENCOUNTER — Encounter: Payer: Self-pay | Admitting: Cardiology

## 2013-10-25 VITALS — BP 140/90 | HR 59 | Ht 64.0 in | Wt 166.0 lb

## 2013-10-25 DIAGNOSIS — R911 Solitary pulmonary nodule: Secondary | ICD-10-CM

## 2013-10-25 DIAGNOSIS — I10 Essential (primary) hypertension: Secondary | ICD-10-CM | POA: Diagnosis not present

## 2013-10-25 DIAGNOSIS — E039 Hypothyroidism, unspecified: Secondary | ICD-10-CM | POA: Diagnosis not present

## 2013-10-25 MED ORDER — LOSARTAN POTASSIUM 50 MG PO TABS
25.0000 mg | ORAL_TABLET | Freq: Every day | ORAL | Status: DC
Start: 2013-10-25 — End: 2015-02-28

## 2013-10-25 NOTE — Patient Instructions (Signed)
I decreased your losartan to half a tab-  25 mg daily.   If your BP begins to stay above 140/90 then call us and we will increase the losartan back.  Follow up with Dr. [name] in 4 months.

## 2013-10-25 NOTE — Progress Notes (Signed)
10/26/2013   PCP: Glo Herring., MD   Chief Complaint  Patient presents with  . Follow-up    6 month visit    Primary Cardiologist: Dr. [name]  HPI:  73 year old female presents for follow up for HTN.   Her history includes a MET test that was negative for ischemia, an echo that showed increased filling pressures.  Dr. [name] began her on a diuretic to which she has had a variable response. She does have an ACE cough. A recent lipid profile showed a total cholesterol of 210 with LDL 111, HDL of 71.  Today no complaints of chest pain or SOB.  Her BP has been low and she decreased losartan.  She recently had a CT of chest for neck swelling and was found to have a lung nodule.  This is followed by PCP.        Allergies  Allergen Reactions  . Codeine   . Lisinopril     Cough    Current Outpatient Prescriptions  Medication Sig Dispense Refill  . acetaminophen (TYLENOL) 500 MG tablet Take 500 mg by mouth as needed for pain.      . hydrochlorothiazide (HYDRODIURIL) 25 MG tablet Take 1 tablet (25 mg total) by mouth daily.  90 tablet  1  . levothyroxine (SYNTHROID, LEVOTHROID) 100 MCG tablet Take 100 mcg by mouth daily before breakfast.      . losartan (COZAAR) 50 MG tablet Take 0.5 tablets (25 mg total) by mouth daily.  90 tablet  3   No current facility-administered medications for this visit.    Past Medical History  Diagnosis Date  . Hypertension   . Arthritis   . Asthma   . COPD (chronic obstructive pulmonary disease)   . Thyroid disease   . Hypothyroidism   . Cystocele 01/19/2013  . Stress incontinence 01/19/2013    Past Surgical History  Procedure Laterality Date  . Cholecystectomy    . Abdominal hysterectomy    . Tubal ligation    . Dilation and curettage of uterus    . Wrist surgery Left   . Cataract extraction      VHQ:IONGEXB:+ flu this winter, no weight changes Skin:no rashes or ulcers HEENT:no blurred vision, no congestion CV:see  HPI PUL:see HPI GI:no diarrhea constipation or melena, no indigestion GU:no hematuria, no dysuria MS:+ knee pain, no claudication Neuro:no syncope, no lightheadedness Endo:no diabetes, no thyroid disease  PHYSICAL EXAM BP 140/90  Pulse 59  Ht 5' 4"  (1.626 m)  Wt 166 lb (75.297 kg)  BMI 28.48 kg/m2 General:Pleasant affect, NAD Skin:Warm and dry, brisk capillary refill HEENT:normocephalic, sclera clear, mucus membranes moist Neck:supple, no JVD, no bruits  Heart:S1S2 RRR without murmur, gallup, rub or click Lungs:clear without rales, rhonchi, or wheezes MWU:XLKG, non tender, + BS, do not palpate liver spleen or masses Ext:no lower ext edema, 2+ pedal pulses, 2+ radial pulses Neuro:alert and oriented, MAE, follows commands, + facial symmetry  EKG: s brady at 59 LAD no acute changes  ASSESSMENT AND PLAN HTN (hypertension) Her BP has been lower and she has weak and fatigued.  She decreased her losartan to 25 mg and today her BP is stable.  Will continue at 25 mg daily but if BP is > 140/85 she will call and we will increase losartan to 50 mg daily.  Hypothyroid Followed by PCP.  Lung nodule Followed by PCP, repeat CT in several months.  She will follow up  with Dr. [name] in 4 months.

## 2013-10-26 DIAGNOSIS — R911 Solitary pulmonary nodule: Secondary | ICD-10-CM | POA: Insufficient documentation

## 2013-10-26 NOTE — Assessment & Plan Note (Signed)
Her BP has been lower and she has weak and fatigued.  She decreased her losartan to 25 mg and today her BP is stable.  Will continue at 25 mg daily but if BP is > 140/85 she will call and we will increase losartan to 50 mg daily.

## 2013-10-26 NOTE — Assessment & Plan Note (Signed)
Followed by PCP

## 2013-10-26 NOTE — Assessment & Plan Note (Signed)
Followed by PCP, repeat CT in several months.

## 2013-11-15 ENCOUNTER — Ambulatory Visit (INDEPENDENT_AMBULATORY_CARE_PROVIDER_SITE_OTHER): Payer: Medicare Other | Admitting: Internal Medicine

## 2013-11-15 ENCOUNTER — Encounter (INDEPENDENT_AMBULATORY_CARE_PROVIDER_SITE_OTHER): Payer: Self-pay | Admitting: Internal Medicine

## 2013-11-15 VITALS — BP 138/60 | HR 64 | Temp 98.1°F | Ht 64.0 in | Wt 165.9 lb

## 2013-11-15 DIAGNOSIS — K838 Other specified diseases of biliary tract: Secondary | ICD-10-CM | POA: Diagnosis not present

## 2013-11-15 DIAGNOSIS — K219 Gastro-esophageal reflux disease without esophagitis: Secondary | ICD-10-CM | POA: Diagnosis not present

## 2013-11-15 LAB — COMPREHENSIVE METABOLIC PANEL
ALT: 19 U/L (ref 0–35)
AST: 20 U/L (ref 0–37)
Albumin: 4.5 g/dL (ref 3.5–5.2)
Alkaline Phosphatase: 59 U/L (ref 39–117)
BILIRUBIN TOTAL: 0.6 mg/dL (ref 0.2–1.2)
BUN: 11 mg/dL (ref 6–23)
CO2: 31 mEq/L (ref 19–32)
Calcium: 10.1 mg/dL (ref 8.4–10.5)
Chloride: 97 mEq/L (ref 96–112)
Creat: 0.87 mg/dL (ref 0.50–1.10)
GLUCOSE: 105 mg/dL — AB (ref 70–99)
Potassium: 4.6 mEq/L (ref 3.5–5.3)
Sodium: 140 mEq/L (ref 135–145)
Total Protein: 6.9 g/dL (ref 6.0–8.3)

## 2013-11-15 NOTE — Patient Instructions (Signed)
Nexium 40mg  daily. If bloating does not resolve, will schedule an EGD with DR. Rehman. OV 6 weeks.

## 2013-11-15 NOTE — Progress Notes (Signed)
Subjective:     Patient ID: April Lang, female   DOB: 09-Sep-1940, 73 y.o.   MRN: [id number]  HPI Referred to our office for dilated CBD and bloating.  She underwent a CT chest for neck swelling. No explanation for neck swelling.  She tells me she feels bloated at times. She has frequent burping. She takes Nexium on a prn basis.  She has acid reflux daily. If she drinks a carbonated drink, she will burp. Appetite is good. No weight loss. No abdominal pain.  Her BMs are normal.   08/21/2013 NA 140, K 4.6, Chloride 101, Glucose 101, BUN 10, Creatinine 0.8, Calcium 9.6.    08/23/2013 CT chest with/without CM: neck swelling: Incidental imaging through the upper abdomen shows changes of prior  cholecystectomy. Increased caliber of the common bile duct measures  9 mm, image 35/series 4.  Fiducial marker placed over the left lower neck is identified on  image number 10/series 2. No underlying mass or fluid collection  identified. No adenopathy noted.  IMPRESSION:  1. No explanation for neck swelling.  2. Noncalcified nodule in the left upper lobe measures 5 mm.   Review of Systems Past Medical History  Diagnosis Date  . Hypertension   . Arthritis   . Asthma   . COPD (chronic obstructive pulmonary disease)   . Thyroid disease   . Hypothyroidism   . Cystocele 01/19/2013  . Stress incontinence 01/19/2013    Past Surgical History  Procedure Laterality Date  . Cholecystectomy    . Abdominal hysterectomy    . Tubal ligation    . Dilation and curettage of uterus    . Wrist surgery Left   . Cataract extraction      Allergies  Allergen Reactions  . Codeine   . Lisinopril     Cough    Current Outpatient Prescriptions on File Prior to Visit  Medication Sig Dispense Refill  . acetaminophen (TYLENOL) 500 MG tablet Take 500 mg by mouth as needed for pain.      . hydrochlorothiazide (HYDRODIURIL) 25 MG tablet Take 1 tablet (25 mg total) by mouth daily.  90 tablet  1  .  levothyroxine (SYNTHROID, LEVOTHROID) 100 MCG tablet Take 100 mcg by mouth daily before breakfast.      . losartan (COZAAR) 50 MG tablet Take 0.5 tablets (25 mg total) by mouth daily.  90 tablet  3   No current facility-administered medications on file prior to visit.        Objective:   Physical Exam  Filed Vitals:   11/15/13 1114  BP: 138/60  Pulse: 64  Temp: 98.1 F (36.7 C)  Height: 5\' 4"  (1.626 m)  Weight: 165 lb 14.4 oz (75.252 kg)   Alert and oriented. Skin warm and dry. Oral mucosa is moist.   . Sclera anicteric, conjunctivae is pink. Thyroid not enlarged. No cervical lymphadenopathy. Lungs clear. Heart regular rate and rhythm.  Abdomen is soft. Bowel sounds are positive. No hepatomegaly. No abdominal masses felt. No tenderness.  No edema to lower extremities.       Assessment:    Dilated CBD 38mm. Less than 10. No symptoms. Hx of cholecystectomy.  GERD. Take Nexium on a prn basis.  Patient declined an EGD today.     Plan:    CMET to be sure liver enzymes are not elevated.  Nexium 40mg  daily OV 6 weeks. If bloating, acid reflux not better, will schedule an EGD.

## 2013-11-18 ENCOUNTER — Other Ambulatory Visit: Payer: Self-pay | Admitting: Obstetrics and Gynecology

## 2013-11-18 DIAGNOSIS — Z139 Encounter for screening, unspecified: Secondary | ICD-10-CM

## 2013-11-18 DIAGNOSIS — Z1231 Encounter for screening mammogram for malignant neoplasm of breast: Secondary | ICD-10-CM

## 2013-11-18 DIAGNOSIS — Z0000 Encounter for general adult medical examination without abnormal findings: Secondary | ICD-10-CM

## 2013-12-26 ENCOUNTER — Ambulatory Visit (INDEPENDENT_AMBULATORY_CARE_PROVIDER_SITE_OTHER): Payer: Medicare Other | Admitting: Internal Medicine

## 2013-12-26 ENCOUNTER — Encounter (INDEPENDENT_AMBULATORY_CARE_PROVIDER_SITE_OTHER): Payer: Self-pay | Admitting: Internal Medicine

## 2013-12-26 VITALS — BP 170/68 | HR 76 | Temp 97.9°F | Ht 64.0 in | Wt 165.0 lb

## 2013-12-26 DIAGNOSIS — K838 Other specified diseases of biliary tract: Secondary | ICD-10-CM

## 2013-12-26 LAB — HEPATIC FUNCTION PANEL
ALT: 16 U/L (ref 0–35)
AST: 20 U/L (ref 0–37)
Albumin: 4.4 g/dL (ref 3.5–5.2)
Alkaline Phosphatase: 52 U/L (ref 39–117)
BILIRUBIN DIRECT: 0.1 mg/dL (ref 0.0–0.3)
BILIRUBIN INDIRECT: 0.4 mg/dL (ref 0.2–1.2)
Total Bilirubin: 0.5 mg/dL (ref 0.2–1.2)
Total Protein: 6.9 g/dL (ref 6.0–8.3)

## 2013-12-26 NOTE — Patient Instructions (Signed)
Nexium daily. OV prn

## 2013-12-26 NOTE — Progress Notes (Signed)
Subjective:     Patient ID: April Lang, female   DOB: 1941-02-13, 73 y.o.   MRN: [id number]  HPI Here today for f/u. Last seen about 6 weeks ago. Referred for a dilated CBD with normal liver enzymes and bloating. Hx of prior cholecystectomy in 2004 by Dr. Arnoldo Morale for cholecystitis/cholelithiasis.  She declined an EGD last visit. She says she continues to bloating. She takes Nexium on a prn basis.  She denies having any acid reflux.  Appetite is good. No weight loss.  BMs are normal. No melena or bright red rectal bleeding. She does tell me that she ate a banana and she had some bloating.  She underwent a CT of the chest for neck swelling in December of 2014. Incidental imaging through the upper abdomen shows changes of prior  cholecystectomy. Increased caliber of the common bile duct measures  9 mm, image 35/series 4.  Fiducial marker placed over the left lower neck is identified on  image number 10/series 2. No underlying mass or fluid collection  identified. No adenopathy noted.  IMPRESSION:  1. No explanation for neck swelling.  2. Noncalcified nodule in the left upper lobe measures 5 mm. CMP     Component Value Date/Time   NA 140 11/15/2013 1153   K 4.6 11/15/2013 1153   CL 97 11/15/2013 1153   CO2 31 11/15/2013 1153   GLUCOSE 105* 11/15/2013 1153   BUN 11 11/15/2013 1153   CREATININE 0.87 11/15/2013 1153   CALCIUM 10.1 11/15/2013 1153   PROT 6.9 11/15/2013 1153   ALBUMIN 4.5 11/15/2013 1153   AST 20 11/15/2013 1153   ALT 19 11/15/2013 1153   ALKPHOS 59 11/15/2013 1153   BILITOT 0.6 11/15/2013 1153        Review of Systems Past Medical History  Diagnosis Date  . Hypertension   . Arthritis   . Asthma   . COPD (chronic obstructive pulmonary disease)   . Thyroid disease   . Hypothyroidism   . Cystocele 01/19/2013  . Stress incontinence 01/19/2013    Past Surgical History  Procedure Laterality Date  . Cholecystectomy    . Abdominal hysterectomy    . Tubal ligation    .  Dilation and curettage of uterus    . Wrist surgery Left   . Cataract extraction      Allergies  Allergen Reactions  . Codeine   . Lisinopril     Cough    Current Outpatient Prescriptions on File Prior to Visit  Medication Sig Dispense Refill  . acetaminophen (TYLENOL) 500 MG tablet Take 500 mg by mouth as needed for pain.      Marland Kitchen esomeprazole (NEXIUM) 40 MG capsule Take 40 mg by mouth as needed.      . hydrochlorothiazide (HYDRODIURIL) 25 MG tablet Take 1 tablet (25 mg total) by mouth daily.  90 tablet  1  . levothyroxine (SYNTHROID, LEVOTHROID) 100 MCG tablet Take 100 mcg by mouth daily before breakfast.      . losartan (COZAAR) 50 MG tablet Take 0.5 tablets (25 mg total) by mouth daily.  90 tablet  3   No current facility-administered medications on file prior to visit.        Objective:   Physical Exam  Filed Vitals:   12/26/13 0957  BP: 170/68  Pulse: 76  Temp: 97.9 F (36.6 C)  Height: 5\' 4"  (1.626 m)  Weight: 165 lb (74.844 kg)   Alert and oriented. Skin warm and dry. Oral mucosa is  moist.   . Sclera anicteric, conjunctivae is pink. Thyroid not enlarged. No cervical lymphadenopathy. Lungs clear. Heart regular rate and rhythm.  Abdomen is soft. Bowel sounds are positive. No hepatomegaly. No abdominal masses felt. No tenderness.  No edema to lower extremities.       Assessment:     Dilated CBD at 47mm. This is very common after cholecystectomy. Liver enzymes are normal. Patient is asymptomatic. Bloating after eating.    Plan:     Nexium 40mg  daily. Hepatic function. OV in 1 yr.  Needs to exercise daily.

## 2014-01-08 ENCOUNTER — Ambulatory Visit (HOSPITAL_COMMUNITY)
Admission: RE | Admit: 2014-01-08 | Discharge: 2014-01-08 | Disposition: A | Discharge status: Home / Self Care | Payer: Medicare Other | Source: Ambulatory Visit | Attending: Obstetrics and Gynecology | Admitting: Obstetrics and Gynecology

## 2014-01-08 DIAGNOSIS — Z1231 Encounter for screening mammogram for malignant neoplasm of breast: Secondary | ICD-10-CM

## 2014-01-19 DIAGNOSIS — M19049 Primary osteoarthritis, unspecified hand: Secondary | ICD-10-CM | POA: Diagnosis not present

## 2014-01-24 ENCOUNTER — Telehealth: Payer: Self-pay | Admitting: Cardiovascular Disease

## 2014-01-25 NOTE — Telephone Encounter (Signed)
Closed encounter °

## 2014-01-30 ENCOUNTER — Telehealth: Payer: Self-pay | Admitting: Cardiovascular Disease

## 2014-02-09 DIAGNOSIS — Z961 Presence of intraocular lens: Secondary | ICD-10-CM | POA: Diagnosis not present

## 2014-02-09 DIAGNOSIS — H02839 Dermatochalasis of unspecified eye, unspecified eyelid: Secondary | ICD-10-CM | POA: Diagnosis not present

## 2014-02-09 DIAGNOSIS — H18419 Arcus senilis, unspecified eye: Secondary | ICD-10-CM | POA: Diagnosis not present

## 2014-02-09 NOTE — Telephone Encounter (Signed)
Closed encounter °

## 2014-02-13 DIAGNOSIS — M19049 Primary osteoarthritis, unspecified hand: Secondary | ICD-10-CM | POA: Diagnosis not present

## 2014-02-22 ENCOUNTER — Ambulatory Visit: Payer: Medicare Other | Admitting: Cardiovascular Disease

## 2014-02-23 ENCOUNTER — Encounter: Payer: Self-pay | Admitting: Cardiovascular Disease

## 2014-02-23 ENCOUNTER — Ambulatory Visit (INDEPENDENT_AMBULATORY_CARE_PROVIDER_SITE_OTHER): Payer: Medicare Other | Admitting: Cardiovascular Disease

## 2014-02-23 VITALS — BP 150/72 | HR 65 | Ht 64.0 in | Wt 158.0 lb

## 2014-02-23 DIAGNOSIS — I10 Essential (primary) hypertension: Secondary | ICD-10-CM | POA: Diagnosis not present

## 2014-02-23 NOTE — Assessment & Plan Note (Signed)
Well-controlled on current medications 

## 2014-02-23 NOTE — Patient Instructions (Signed)
Your physician wants you to follow-up in: 1 year with Dr Berry. You will receive a reminder letter in the mail two months in advance. If you don't receive a letter, please call our office to schedule the follow-up appointment.  

## 2014-02-23 NOTE — Progress Notes (Signed)
     02/23/2014 April Lang   04-26-1941  [id number]  Primary Physician Glo Herring., MD Primary Cardiologist: Lorretta Harp MD Renae Gloss   HPI:  The patient returns today for followup. I last saw her 12 months ago for dyspnea. She had a MET test in the past  that was negative for ischemia, an echo that showed increased filling pressures. I began her on a diuretic to which she has had a variable response. She does have an ACE cough. A recent lipid profile showed a total cholesterol of 210 with LDL 111, HDL of 71.since I saw her back in the office 04/24/13 she denies chest pain or shortness of breath.     Current Outpatient Prescriptions  Medication Sig Dispense Refill  . acetaminophen (TYLENOL) 500 MG tablet Take 500 mg by mouth as needed for pain.      Marland Kitchen esomeprazole (NEXIUM) 40 MG capsule Take 40 mg by mouth as needed.      . hydrochlorothiazide (HYDRODIURIL) 25 MG tablet Take 1 tablet (25 mg total) by mouth daily.  90 tablet  1  . levothyroxine (SYNTHROID, LEVOTHROID) 100 MCG tablet Take 100 mcg by mouth daily before breakfast.      . losartan (COZAAR) 50 MG tablet Take 0.5 tablets (25 mg total) by mouth daily.  90 tablet  3  . oxyCODONE (OXY IR/ROXICODONE) 5 MG immediate release tablet Take by mouth every 6 (six) hours as needed.        No current facility-administered medications for this visit.    Allergies  Allergen Reactions  . Codeine   . Lisinopril     Cough    History   Social History  . Marital Status: Married    Spouse Name: N/A    Number of Children: N/A  . Years of Education: N/A   Occupational History  . Not on file.   Social History Main Topics  . Smoking status: Never Smoker   . Smokeless tobacco: Never Used  . Alcohol Use: No  . Drug Use: No  . Sexual Activity: Not Currently    Birth Control/ Protection: Surgical   Other Topics Concern  . Not on file   Social History Narrative  . No narrative on file     Review of  Systems: General: negative for chills, fever, night sweats or weight changes.  Cardiovascular: negative for chest pain, dyspnea on exertion, edema, orthopnea, palpitations, paroxysmal nocturnal dyspnea or shortness of breath Dermatological: negative for rash Respiratory: negative for cough or wheezing Urologic: negative for hematuria Abdominal: negative for nausea, vomiting, diarrhea, bright red blood per rectum, melena, or hematemesis Neurologic: negative for visual changes, syncope, or dizziness All other systems reviewed and are otherwise negative except as noted above.    Blood pressure 150/72, pulse 65, height $RemoveBe'5\' 4"'MRAviptlS$  (1.626 m), weight 158 lb (71.668 kg).  General appearance: alert and no distress Neck: no adenopathy, no carotid bruit, no JVD, supple, symmetrical, trachea midline and thyroid not enlarged, symmetric, no tenderness/mass/nodules Lungs: clear to auscultation bilaterally Heart: regular rate and rhythm, S1, S2 normal, no murmur, click, rub or gallop Extremities: extremities normal, atraumatic, no cyanosis or edema  EKG normal sinus rhythm at 65 without ST or T wave changes. There was left axis deviation and minimal changes of LVH.  ASSESSMENT AND PLAN:   HTN (hypertension) Well-controlled on current medications      Lorretta Harp MD [facility], [facility] 02/23/2014 3:46 PM

## 2014-02-28 DIAGNOSIS — M19249 Secondary osteoarthritis, unspecified hand: Secondary | ICD-10-CM | POA: Diagnosis not present

## 2014-02-28 DIAGNOSIS — Z4789 Encounter for other orthopedic aftercare: Secondary | ICD-10-CM | POA: Diagnosis not present

## 2014-02-28 DIAGNOSIS — Z9889 Other specified postprocedural states: Secondary | ICD-10-CM | POA: Diagnosis not present

## 2014-03-01 ENCOUNTER — Encounter (INDEPENDENT_AMBULATORY_CARE_PROVIDER_SITE_OTHER): Payer: Self-pay

## 2014-03-05 ENCOUNTER — Other Ambulatory Visit: Payer: Self-pay | Admitting: *Deleted

## 2014-03-05 MED ORDER — HYDROCHLOROTHIAZIDE 25 MG PO TABS: 25.0000 mg | ORAL_TABLET | Freq: Every day | ORAL | Status: DC

## 2014-03-07 DIAGNOSIS — M25649 Stiffness of unspecified hand, not elsewhere classified: Secondary | ICD-10-CM | POA: Diagnosis not present

## 2014-03-16 DIAGNOSIS — Z4789 Encounter for other orthopedic aftercare: Secondary | ICD-10-CM | POA: Diagnosis not present

## 2014-03-16 DIAGNOSIS — M19249 Secondary osteoarthritis, unspecified hand: Secondary | ICD-10-CM | POA: Diagnosis not present

## 2014-03-28 DIAGNOSIS — M19249 Secondary osteoarthritis, unspecified hand: Secondary | ICD-10-CM | POA: Diagnosis not present

## 2014-03-28 DIAGNOSIS — Z4789 Encounter for other orthopedic aftercare: Secondary | ICD-10-CM | POA: Diagnosis not present

## 2014-03-28 DIAGNOSIS — M25649 Stiffness of unspecified hand, not elsewhere classified: Secondary | ICD-10-CM | POA: Diagnosis not present

## 2014-04-03 DIAGNOSIS — M25649 Stiffness of unspecified hand, not elsewhere classified: Secondary | ICD-10-CM | POA: Diagnosis not present

## 2014-04-18 DIAGNOSIS — M25649 Stiffness of unspecified hand, not elsewhere classified: Secondary | ICD-10-CM | POA: Diagnosis not present

## 2014-04-25 DIAGNOSIS — Z9889 Other specified postprocedural states: Secondary | ICD-10-CM | POA: Diagnosis not present

## 2014-04-25 DIAGNOSIS — Z4789 Encounter for other orthopedic aftercare: Secondary | ICD-10-CM | POA: Diagnosis not present

## 2014-04-25 DIAGNOSIS — M25649 Stiffness of unspecified hand, not elsewhere classified: Secondary | ICD-10-CM | POA: Diagnosis not present

## 2014-05-13 IMAGING — MG MM DIGITAL SCREENING
4 series · 4 of 4 positions shown · non-contrast
Comparison: Previous exam(s).

CLINICAL DATA: Screening.

EXAM:
DIGITAL SCREENING BILATERAL MAMMOGRAM WITH CAD

[L CC]
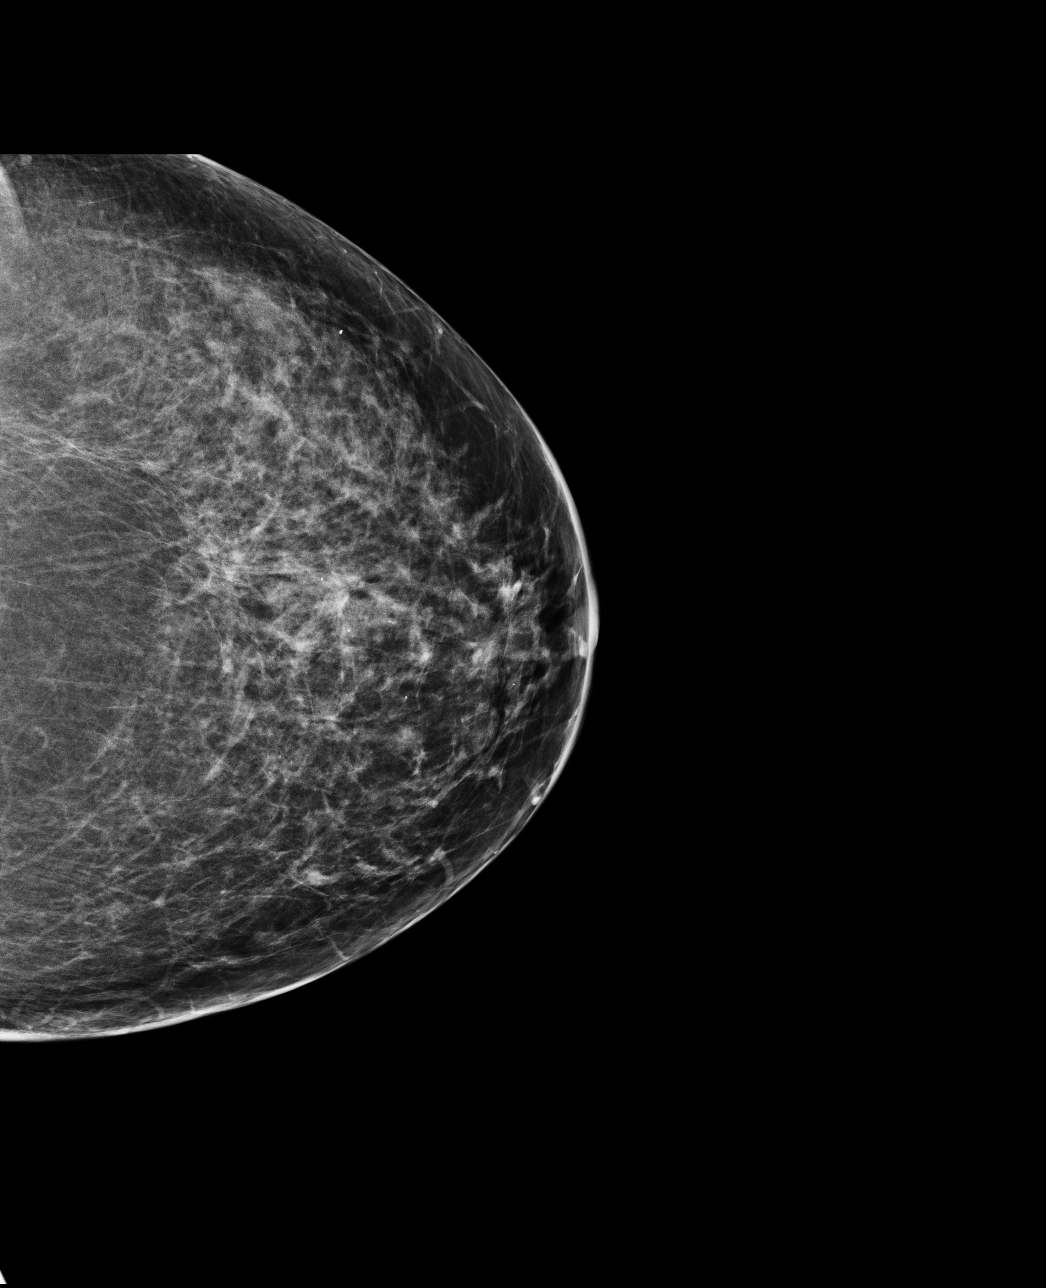

[L MLO]
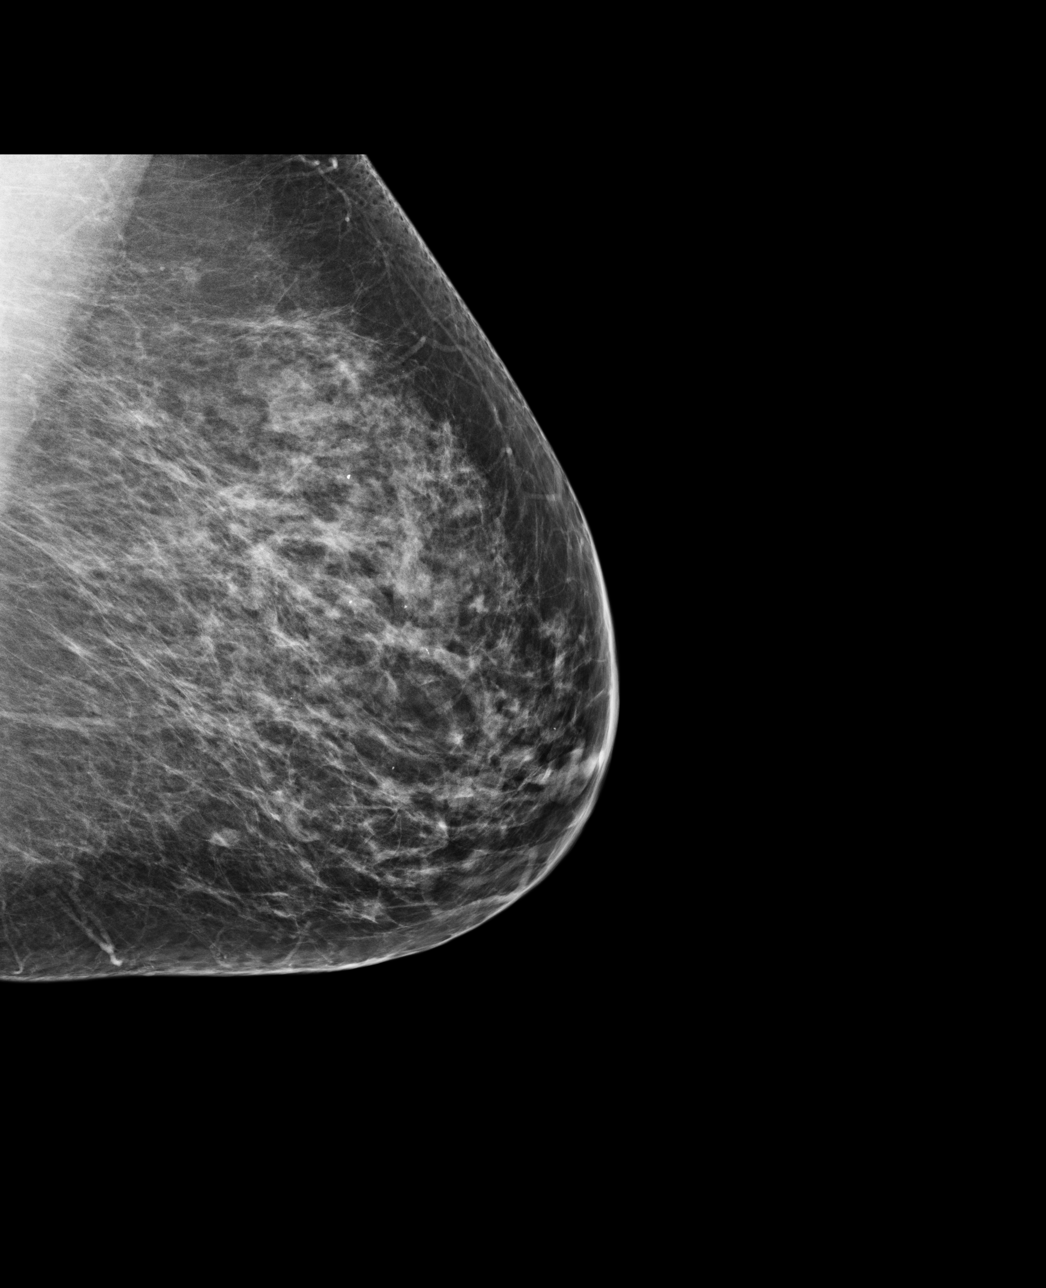

[R CC]
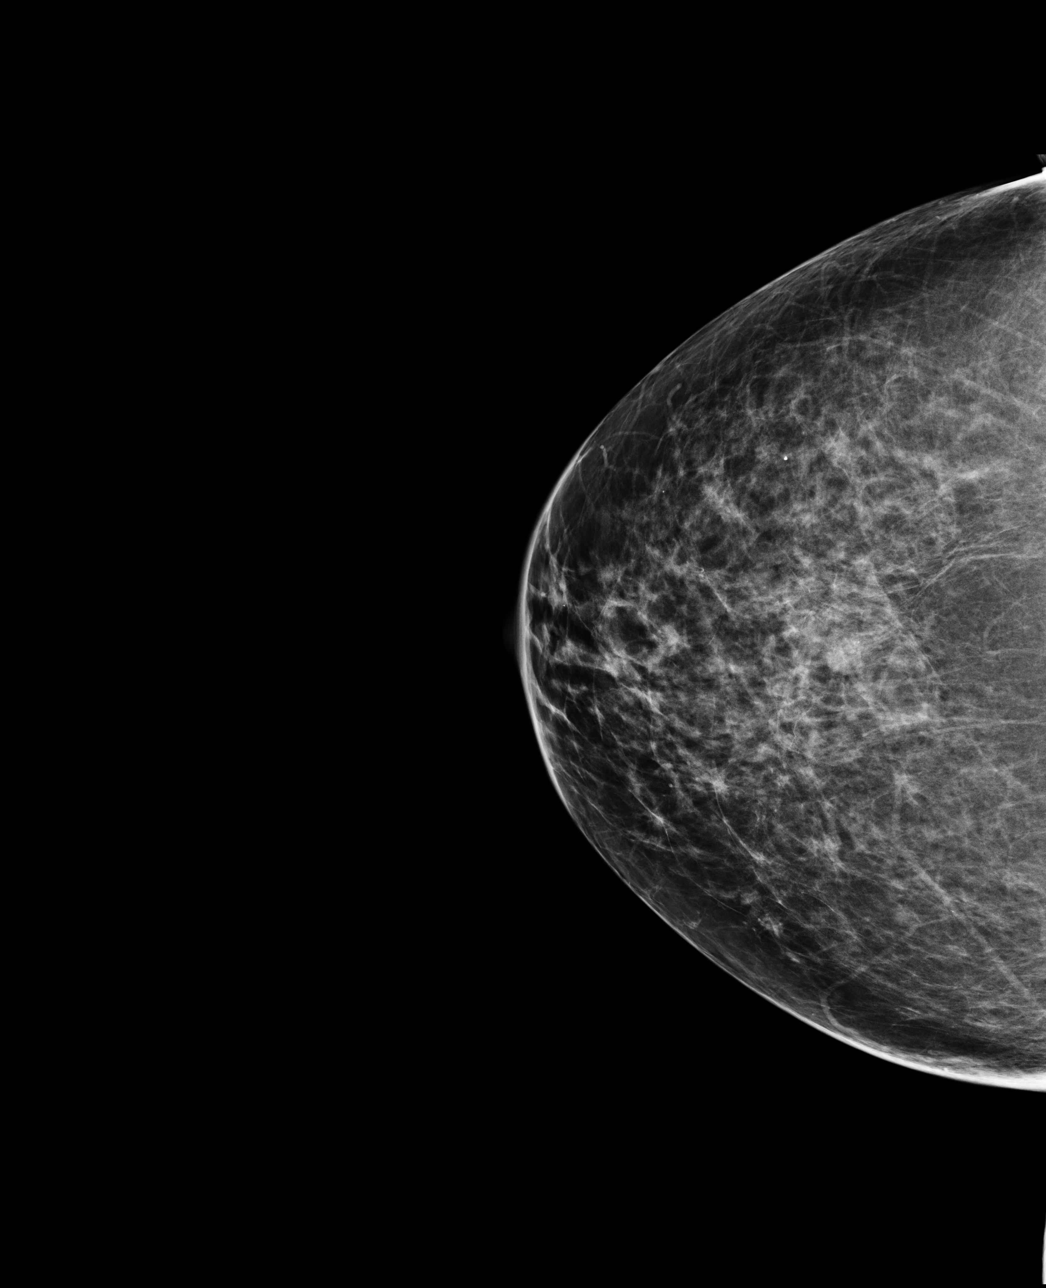

[R MLO]
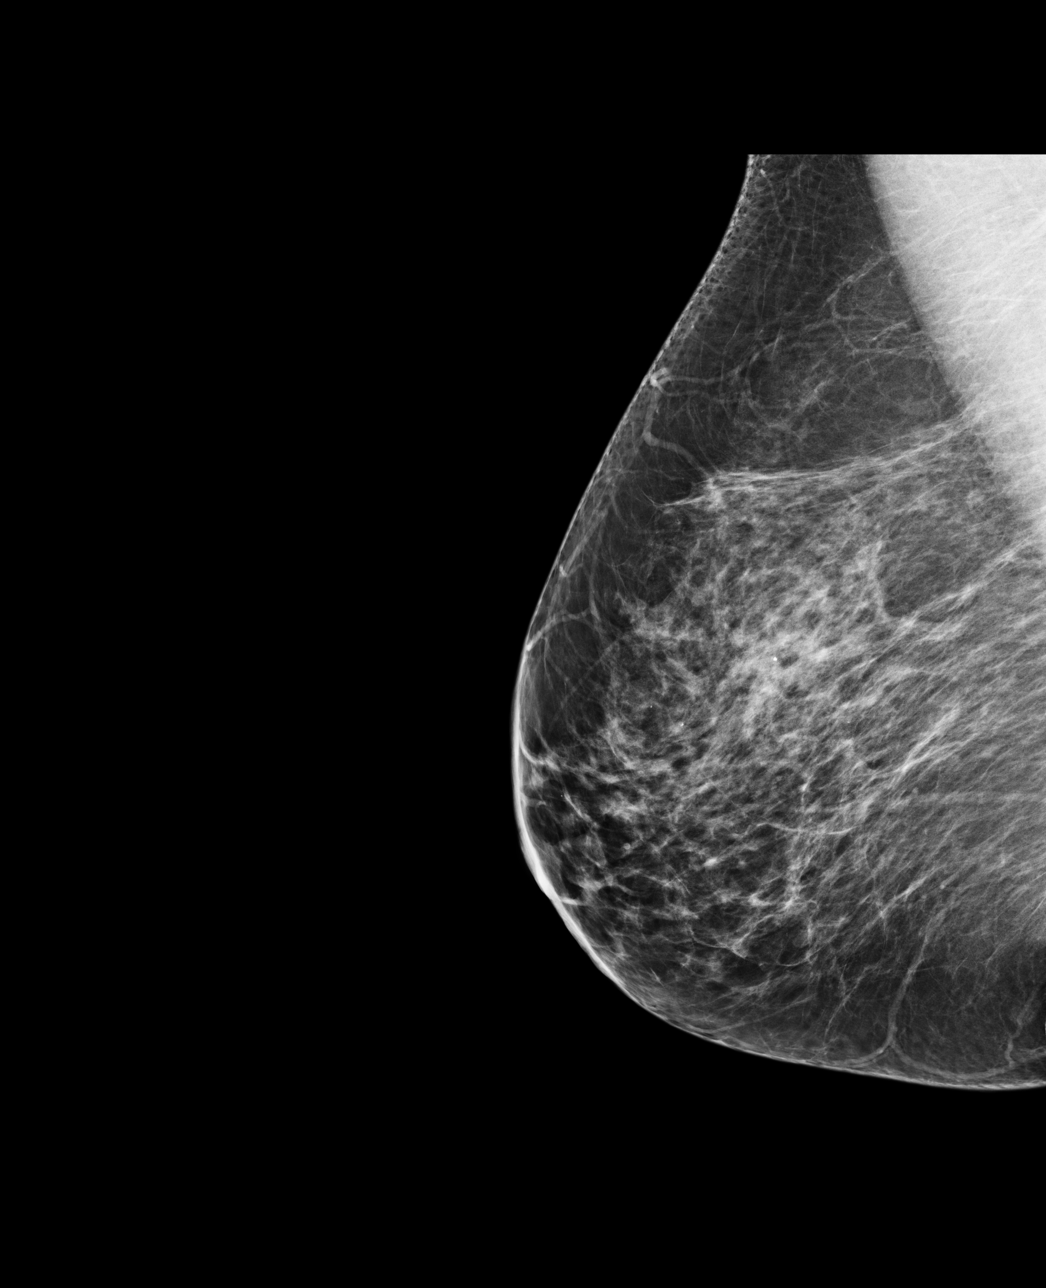

[4 of 4 positions shown; findings below may reference images not displayed]

ACR Breast Density Category c: The breast tissue is heterogeneously
dense, which may obscure small masses.
FINDINGS: There are no findings suspicious for malignancy. Images were
processed with CAD.
IMPRESSION: No mammographic evidence of malignancy. A result letter of this
screening mammogram will be mailed directly to the patient.

RECOMMENDATION:
Screening mammogram in one year. (Code:[id number])

BI-RADS CATEGORY  1: Negative.

## 2014-05-19 DIAGNOSIS — Z23 Encounter for immunization: Secondary | ICD-10-CM | POA: Diagnosis not present

## 2014-05-23 DIAGNOSIS — Z9889 Other specified postprocedural states: Secondary | ICD-10-CM | POA: Diagnosis not present

## 2014-05-23 DIAGNOSIS — Z4789 Encounter for other orthopedic aftercare: Secondary | ICD-10-CM | POA: Diagnosis not present

## 2014-06-15 DIAGNOSIS — Z6828 Body mass index (BMI) 28.0-28.9, adult: Secondary | ICD-10-CM | POA: Diagnosis not present

## 2014-06-15 DIAGNOSIS — I10 Essential (primary) hypertension: Secondary | ICD-10-CM | POA: Diagnosis not present

## 2014-06-15 DIAGNOSIS — E782 Mixed hyperlipidemia: Secondary | ICD-10-CM | POA: Diagnosis not present

## 2014-06-15 DIAGNOSIS — E039 Hypothyroidism, unspecified: Secondary | ICD-10-CM | POA: Diagnosis not present

## 2014-06-15 DIAGNOSIS — E663 Overweight: Secondary | ICD-10-CM | POA: Diagnosis not present

## 2014-06-15 DIAGNOSIS — M1991 Primary osteoarthritis, unspecified site: Secondary | ICD-10-CM | POA: Diagnosis not present

## 2014-06-20 DIAGNOSIS — E781 Pure hyperglyceridemia: Secondary | ICD-10-CM | POA: Diagnosis not present

## 2014-06-20 DIAGNOSIS — R7309 Other abnormal glucose: Secondary | ICD-10-CM | POA: Diagnosis not present

## 2014-07-09 ENCOUNTER — Encounter: Payer: Self-pay | Admitting: Cardiovascular Disease

## 2014-07-30 DIAGNOSIS — Z472 Encounter for removal of internal fixation device: Secondary | ICD-10-CM | POA: Diagnosis not present

## 2014-08-09 DIAGNOSIS — Z472 Encounter for removal of internal fixation device: Secondary | ICD-10-CM | POA: Diagnosis not present

## 2014-10-03 ENCOUNTER — Other Ambulatory Visit (HOSPITAL_COMMUNITY): Payer: Self-pay | Admitting: Internal Medicine

## 2014-10-03 DIAGNOSIS — R222 Localized swelling, mass and lump, trunk: Secondary | ICD-10-CM

## 2014-10-05 ENCOUNTER — Ambulatory Visit (HOSPITAL_COMMUNITY)
Admission: RE | Admit: 2014-10-05 | Discharge: 2014-10-05 | Disposition: A | Discharge status: Home / Self Care | Payer: Medicare Other | Source: Ambulatory Visit | Attending: Internal Medicine | Admitting: Internal Medicine

## 2014-10-05 DIAGNOSIS — R222 Localized swelling, mass and lump, trunk: Secondary | ICD-10-CM | POA: Diagnosis present

## 2014-10-18 ENCOUNTER — Encounter (INDEPENDENT_AMBULATORY_CARE_PROVIDER_SITE_OTHER): Payer: Self-pay | Admitting: *Deleted

## 2014-11-21 DIAGNOSIS — M1711 Unilateral primary osteoarthritis, right knee: Secondary | ICD-10-CM | POA: Diagnosis not present

## 2014-12-03 ENCOUNTER — Other Ambulatory Visit (HOSPITAL_COMMUNITY): Payer: Self-pay | Admitting: Internal Medicine

## 2014-12-03 DIAGNOSIS — Z1231 Encounter for screening mammogram for malignant neoplasm of breast: Secondary | ICD-10-CM

## 2014-12-12 DIAGNOSIS — J301 Allergic rhinitis due to pollen: Secondary | ICD-10-CM | POA: Diagnosis not present

## 2014-12-12 DIAGNOSIS — E663 Overweight: Secondary | ICD-10-CM | POA: Diagnosis not present

## 2014-12-12 DIAGNOSIS — Z6827 Body mass index (BMI) 27.0-27.9, adult: Secondary | ICD-10-CM | POA: Diagnosis not present

## 2014-12-12 DIAGNOSIS — J209 Acute bronchitis, unspecified: Secondary | ICD-10-CM | POA: Diagnosis not present

## 2014-12-27 ENCOUNTER — Encounter (INDEPENDENT_AMBULATORY_CARE_PROVIDER_SITE_OTHER): Payer: Self-pay | Admitting: Internal Medicine

## 2014-12-27 ENCOUNTER — Ambulatory Visit (INDEPENDENT_AMBULATORY_CARE_PROVIDER_SITE_OTHER): Payer: Medicare Other | Admitting: Internal Medicine

## 2014-12-27 VITALS — BP 146/52 | HR 64 | Temp 98.0°F | Ht 65.0 in | Wt 161.2 lb

## 2014-12-27 DIAGNOSIS — K838 Other specified diseases of biliary tract: Secondary | ICD-10-CM

## 2014-12-27 LAB — HEPATIC FUNCTION PANEL
ALT: 14 U/L (ref 0–35)
AST: 14 U/L (ref 0–37)
Albumin: 3.9 g/dL (ref 3.5–5.2)
Alkaline Phosphatase: 71 U/L (ref 39–117)
BILIRUBIN TOTAL: 0.8 mg/dL (ref 0.2–1.2)
Bilirubin, Direct: 0.1 mg/dL (ref 0.0–0.3)
Indirect Bilirubin: 0.7 mg/dL (ref 0.2–1.2)
Total Protein: 6.4 g/dL (ref 6.0–8.3)

## 2014-12-27 NOTE — Patient Instructions (Signed)
Hepatic function.  OV in 1 year. 

## 2014-12-27 NOTE — Progress Notes (Signed)
   Subjective:    Patient ID: April Lang, female    DOB: 28-May-1941, 74 y.o.   MRN: [id number]  HPI Here today for f/u. She was last seen in April of last year. She had been referred to a CBD of 65mm. Hx of cholecystectomy in 2004 by Dr. Arnoldo Morale. She has normal liver enzymes. Rt hand surgery 02/2014 for pain . She tells me she is doing well. She does not have any abdominal pain. She denies acid reflux. She watches what she eats. She eats out less. She denies any abdominal pain.  Her appetite is good. No weight loss. She usually has BM 1-2 times a day. No melena or BRRB. Has had bronchitis for over 4 weeks.  Hepatic Function Panel     Component Value Date/Time   PROT 6.9 12/26/2013 1029   ALBUMIN 4.4 12/26/2013 1029   AST 20 12/26/2013 1029   ALT 16 12/26/2013 1029   ALKPHOS 52 12/26/2013 1029   BILITOT 0.5 12/26/2013 1029   BILIDIR 0.1 12/26/2013 1029   IBILI 0.4 12/26/2013 1029      Review of Systems Past Medical History  Diagnosis Date  . Hypertension   . Arthritis   . Asthma   . COPD (chronic obstructive pulmonary disease)   . Thyroid disease   . Hypothyroidism   . Cystocele 01/19/2013  . Stress incontinence 01/19/2013    Past Surgical History  Procedure Laterality Date  . Cholecystectomy    . Abdominal hysterectomy    . Tubal ligation    . Dilation and curettage of uterus    . Wrist surgery Left   . Cataract extraction      Allergies  Allergen Reactions  . Codeine   . Lisinopril     Cough    Current Outpatient Prescriptions on File Prior to Visit  Medication Sig Dispense Refill  . acetaminophen (TYLENOL) 500 MG tablet Take 500 mg by mouth as needed for pain.    . hydrochlorothiazide (HYDRODIURIL) 25 MG tablet Take 1 tablet (25 mg total) by mouth daily. 90 tablet 3  . levothyroxine (SYNTHROID, LEVOTHROID) 100 MCG tablet Take 100 mcg by mouth daily before breakfast.    . losartan (COZAAR) 50 MG tablet Take 0.5 tablets (25 mg total) by mouth daily. 90  tablet 3   No current facility-administered medications on file prior to visit.        Objective:   Physical Exam Blood pressure 146/52, pulse 64, temperature 98 F (36.7 C), height 5\' 5"  (1.651 m), weight 161 lb 3.2 oz (73.12 kg).  Alert and oriented. Skin warm and dry. Oral mucosa is moist.   . Sclera anicteric, conjunctivae is pink. Thyroid not enlarged. No cervical lymphadenopathy. Lungs clear. Heart regular rate and rhythm.  Abdomen is soft. Bowel sounds are positive. No hepatomegaly. No abdominal masses felt. No tenderness.  No edema to lower extremities.         Assessment & Plan:  Dilated CBD which is normal after cholecystectomy. Hepatic function today. OV in1 year.

## 2014-12-28 ENCOUNTER — Encounter (INDEPENDENT_AMBULATORY_CARE_PROVIDER_SITE_OTHER): Payer: Self-pay

## 2015-01-14 ENCOUNTER — Ambulatory Visit (HOSPITAL_COMMUNITY)
Admission: RE | Admit: 2015-01-14 | Discharge: 2015-01-14 | Disposition: A | Discharge status: Home / Self Care | Payer: Medicare Other | Source: Ambulatory Visit | Attending: Internal Medicine | Admitting: Internal Medicine

## 2015-01-14 DIAGNOSIS — Z1231 Encounter for screening mammogram for malignant neoplasm of breast: Secondary | ICD-10-CM | POA: Insufficient documentation

## 2015-01-21 ENCOUNTER — Ambulatory Visit (INDEPENDENT_AMBULATORY_CARE_PROVIDER_SITE_OTHER): Payer: Medicare Other | Admitting: Adult Health

## 2015-01-21 ENCOUNTER — Encounter: Payer: Self-pay | Admitting: Adult Health

## 2015-01-21 VITALS — BP 152/70 | HR 80 | Ht 64.0 in | Wt 163.0 lb

## 2015-01-21 DIAGNOSIS — N3946 Mixed incontinence: Secondary | ICD-10-CM

## 2015-01-21 DIAGNOSIS — Z1212 Encounter for screening for malignant neoplasm of rectum: Secondary | ICD-10-CM

## 2015-01-21 DIAGNOSIS — Z01419 Encounter for gynecological examination (general) (routine) without abnormal findings: Secondary | ICD-10-CM

## 2015-01-21 LAB — HEMOCCULT GUIAC POC 1CARD (OFFICE): FECAL OCCULT BLD: NEGATIVE

## 2015-01-21 NOTE — Progress Notes (Signed)
Patient ID: April Lang, female   DOB: 02-07-1941, 74 y.o.   MRN: [id number] History of Present Illness: April Lang is a 74 year old white female,married in for well woman gyn exam,she is sp hysterectomy. PCP is Dr [name]. Dr [name] is cardiologist and Dr Laural Golden is GI.  Current Medications, Allergies, Past Medical History, Past Surgical History, Family History and Social History were reviewed in [employer] record.     Review of Systems: Patient denies any headaches, hearing loss, fatigue, blurred vision, shortness of breath, chest pain, abdominal pain, problems with bowel movements, or intercourse(not having sex). No joint pain or mood swings.Has urinary incontinence at times.    Physical Exam:BP 152/70 mmHg  Pulse 80  Ht 5\' 4"  (1.626 m)  Wt 163 lb (73.936 kg)  BMI 27.97 kg/m2 General:  Well developed, well nourished, no acute distress Skin:  Warm and dry Neck:  Midline trachea, normal thyroid, good ROM, no lymphadenopathy, no carotid bruits heard, has some puffiness in left neck area above clavicle ?fat pad Lungs; Clear to auscultation bilaterally Breast:  No dominant palpable mass, retraction, or nipple discharge Cardiovascular: Regular rate and rhythm Abdomen:  Soft, non tender, no hepatosplenomegaly Pelvic:  External genitalia is normal in appearance, no lesions.  The vagina is atrophic. Urethra has no lesions or masses. The cervix and uterus are absent.  No adnexal masses or tenderness noted.Bladder is non tender, no masses felt. Rectal: Good sphincter tone, no polyps, or hemorrhoids felt.  Hemoccult negative. Extremities/musculoskeletal:  No swelling or varicosities noted, no clubbing or cyanosis Psych:  No mood changes, alert and cooperative,seems happy She wears a pantie liner and is good with that, she is active and mows her lawn and gardens, her husband has stage 3 kidney disease.  Impression: Well woman gyn exam no pap Mixed UI    Plan: Physical in 2  year  Mammogram yearly Labs with PCP Colonoscopy per Dr Laural Golden

## 2015-01-21 NOTE — Patient Instructions (Signed)
Physical in 2 year Mammogram yearly Labs with PCP  Colonoscopy per Dr Laural Golden

## 2015-02-07 DIAGNOSIS — Z6827 Body mass index (BMI) 27.0-27.9, adult: Secondary | ICD-10-CM | POA: Diagnosis not present

## 2015-02-07 DIAGNOSIS — I10 Essential (primary) hypertension: Secondary | ICD-10-CM | POA: Diagnosis not present

## 2015-02-07 DIAGNOSIS — E063 Autoimmune thyroiditis: Secondary | ICD-10-CM | POA: Diagnosis not present

## 2015-02-07 DIAGNOSIS — E663 Overweight: Secondary | ICD-10-CM | POA: Diagnosis not present

## 2015-02-07 DIAGNOSIS — L0100 Impetigo, unspecified: Secondary | ICD-10-CM | POA: Diagnosis not present

## 2015-02-15 DIAGNOSIS — H02839 Dermatochalasis of unspecified eye, unspecified eyelid: Secondary | ICD-10-CM | POA: Diagnosis not present

## 2015-02-15 DIAGNOSIS — Z961 Presence of intraocular lens: Secondary | ICD-10-CM | POA: Diagnosis not present

## 2015-02-15 DIAGNOSIS — H04123 Dry eye syndrome of bilateral lacrimal glands: Secondary | ICD-10-CM | POA: Diagnosis not present

## 2015-02-15 DIAGNOSIS — H18413 Arcus senilis, bilateral: Secondary | ICD-10-CM | POA: Diagnosis not present

## 2015-02-22 DIAGNOSIS — Z6829 Body mass index (BMI) 29.0-29.9, adult: Secondary | ICD-10-CM | POA: Diagnosis not present

## 2015-02-22 DIAGNOSIS — Z0000 Encounter for general adult medical examination without abnormal findings: Secondary | ICD-10-CM | POA: Diagnosis not present

## 2015-02-22 DIAGNOSIS — I10 Essential (primary) hypertension: Secondary | ICD-10-CM | POA: Diagnosis not present

## 2015-02-22 DIAGNOSIS — H0263 Xanthelasma of right eye, unspecified eyelid: Secondary | ICD-10-CM | POA: Diagnosis not present

## 2015-02-22 DIAGNOSIS — E063 Autoimmune thyroiditis: Secondary | ICD-10-CM | POA: Diagnosis not present

## 2015-02-27 ENCOUNTER — Encounter: Payer: Self-pay | Admitting: Cardiovascular Disease

## 2015-02-27 ENCOUNTER — Ambulatory Visit (INDEPENDENT_AMBULATORY_CARE_PROVIDER_SITE_OTHER): Payer: Medicare Other | Admitting: Cardiovascular Disease

## 2015-02-27 VITALS — BP 148/80 | HR 61 | Ht 64.0 in | Wt 162.0 lb

## 2015-02-27 DIAGNOSIS — E785 Hyperlipidemia, unspecified: Secondary | ICD-10-CM | POA: Diagnosis not present

## 2015-02-27 DIAGNOSIS — I10 Essential (primary) hypertension: Secondary | ICD-10-CM | POA: Diagnosis not present

## 2015-02-27 DIAGNOSIS — E7800 Pure hypercholesterolemia, unspecified: Secondary | ICD-10-CM | POA: Insufficient documentation

## 2015-02-27 NOTE — Progress Notes (Signed)
     02/27/2015 April Lang   04/18/1941  [id number]  Primary Physician Glo Herring., MD Primary Cardiologist: Lorretta Harp MD Renae Gloss   HPI:   The patient returns today for followup. I last saw her 12 months ago for dyspnea. She had a MET test in the past that was negative for ischemia, an echo that showed increased filling pressures. I began her on a diuretic to which she has had a variable response. She does have an ACE cough and was started on losartan instead. She denies chest pain or shortness of breath. Her primary care physician follows her lipid profile closely. She apparently has been intolerant to Crestor in the past.   Current Outpatient Prescriptions  Medication Sig Dispense Refill  . acetaminophen (TYLENOL) 500 MG tablet Take 500 mg by mouth as needed for pain.    . hydrochlorothiazide (HYDRODIURIL) 25 MG tablet Take 1 tablet (25 mg total) by mouth daily. 90 tablet 3  . levothyroxine (SYNTHROID, LEVOTHROID) 100 MCG tablet Take 100 mcg by mouth daily before breakfast.    . losartan (COZAAR) 50 MG tablet Take 0.5 tablets (25 mg total) by mouth daily. 90 tablet 3   No current facility-administered medications for this visit.    Allergies  Allergen Reactions  . Codeine   . Lisinopril     Cough    History   Social History  . Marital Status: Married    Spouse Name: N/A  . Number of Children: N/A  . Years of Education: N/A   Occupational History  . Not on file.   Social History Main Topics  . Smoking status: Never Smoker   . Smokeless tobacco: Never Used  . Alcohol Use: No  . Drug Use: No  . Sexual Activity: Not Currently    Birth Control/ Protection: Surgical     Comment: hyst   Other Topics Concern  . Not on file   Social History Narrative     Review of Systems: General: negative for chills, fever, night sweats or weight changes.  Cardiovascular: negative for chest pain, dyspnea on exertion, edema, orthopnea, palpitations,  paroxysmal nocturnal dyspnea or shortness of breath Dermatological: negative for rash Respiratory: negative for cough or wheezing Urologic: negative for hematuria Abdominal: negative for nausea, vomiting, diarrhea, bright red blood per rectum, melena, or hematemesis Neurologic: negative for visual changes, syncope, or dizziness All other systems reviewed and are otherwise negative except as noted above.    Blood pressure 148/80, pulse 61, height 5' 4" (1.626 m), weight 162 lb (73.483 kg).  General appearance: alert and no distress Neck: no adenopathy, no carotid bruit, no JVD, supple, symmetrical, trachea midline and thyroid not enlarged, symmetric, no tenderness/mass/nodules Lungs: clear to auscultation bilaterally Heart: regular rate and rhythm, S1, S2 normal, no murmur, click, rub or gallop Extremities: extremities normal, atraumatic, no cyanosis or edema  EKG normal sinus rhythm at 61 without ST or T-wave changes. 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 I personally reviewed this EKG  ASSESSMENT AND PLAN:   HTN (hypertension) Potential blood pressure measured at 140/80. She is on hydrochlorothiazide and losartan. Continue current meds at current dosing  Hyperlipidemia History of hyperlipidemia intolerant to Crestor. We may want to try another statin drug.      Lorretta Harp MD FACP,FACC,FAHA, [facility] 02/27/2015 9:31 AM

## 2015-02-27 NOTE — Assessment & Plan Note (Addendum)
Potential blood pressure measured at 140/80. She is on hydrochlorothiazide and losartan. Continue current meds at current dosing

## 2015-02-27 NOTE — Patient Instructions (Signed)
We request that you follow-up in: 1 year with an extender and in 2 year with Dr Andria Rhein will receive a reminder letter in the mail two months in advance. If you don't receive a letter, please call our office to schedule the follow-up appointment.

## 2015-02-27 NOTE — Assessment & Plan Note (Signed)
History of hyperlipidemia intolerant to Crestor. We may want to try another statin drug.

## 2015-02-28 ENCOUNTER — Other Ambulatory Visit: Payer: Self-pay | Admitting: Cardiovascular Disease

## 2015-02-28 NOTE — Telephone Encounter (Signed)
Rx(s) sent to pharmacy electronically.  Patient reports she takes 50mg  losartan most days (althougth Rx on file states 25mg  - and was reviewed yesterday during office visit - unsure if patient disclosed this information to MD).   She will take 1/2 to 1 tab of losartan 50mg  depending on how she feels/her blood pressure.

## 2015-03-04 ENCOUNTER — Other Ambulatory Visit: Payer: Self-pay

## 2015-03-05 DIAGNOSIS — L929 Granulomatous disorder of the skin and subcutaneous tissue, unspecified: Secondary | ICD-10-CM | POA: Diagnosis not present

## 2015-03-05 DIAGNOSIS — D239 Other benign neoplasm of skin, unspecified: Secondary | ICD-10-CM | POA: Diagnosis not present

## 2015-03-25 DIAGNOSIS — Z23 Encounter for immunization: Secondary | ICD-10-CM | POA: Diagnosis not present

## 2015-04-25 DIAGNOSIS — M1711 Unilateral primary osteoarthritis, right knee: Secondary | ICD-10-CM | POA: Diagnosis not present

## 2015-04-25 DIAGNOSIS — M170 Bilateral primary osteoarthritis of knee: Secondary | ICD-10-CM | POA: Diagnosis not present

## 2015-05-23 DIAGNOSIS — Z23 Encounter for immunization: Secondary | ICD-10-CM | POA: Diagnosis not present

## 2015-06-14 DIAGNOSIS — E038 Other specified hypothyroidism: Secondary | ICD-10-CM | POA: Diagnosis not present

## 2015-06-20 ENCOUNTER — Other Ambulatory Visit: Payer: Self-pay | Admitting: Cardiovascular Disease

## 2015-06-20 DIAGNOSIS — E038 Other specified hypothyroidism: Secondary | ICD-10-CM | POA: Diagnosis not present

## 2015-06-20 NOTE — Telephone Encounter (Signed)
,  Rx(s) sent to pharmacy electronically.  

## 2015-09-24 ENCOUNTER — Encounter (INDEPENDENT_AMBULATORY_CARE_PROVIDER_SITE_OTHER): Payer: Self-pay | Admitting: Internal Medicine

## 2015-10-22 DIAGNOSIS — M1711 Unilateral primary osteoarthritis, right knee: Secondary | ICD-10-CM | POA: Diagnosis not present

## 2015-11-26 DIAGNOSIS — Z6827 Body mass index (BMI) 27.0-27.9, adult: Secondary | ICD-10-CM | POA: Diagnosis not present

## 2015-11-26 DIAGNOSIS — G4700 Insomnia, unspecified: Secondary | ICD-10-CM | POA: Diagnosis not present

## 2015-11-26 DIAGNOSIS — E782 Mixed hyperlipidemia: Secondary | ICD-10-CM | POA: Diagnosis not present

## 2015-11-26 DIAGNOSIS — I10 Essential (primary) hypertension: Secondary | ICD-10-CM | POA: Diagnosis not present

## 2015-11-26 DIAGNOSIS — E663 Overweight: Secondary | ICD-10-CM | POA: Diagnosis not present

## 2015-11-26 DIAGNOSIS — Z1389 Encounter for screening for other disorder: Secondary | ICD-10-CM | POA: Diagnosis not present

## 2015-11-26 DIAGNOSIS — J45909 Unspecified asthma, uncomplicated: Secondary | ICD-10-CM | POA: Diagnosis not present

## 2015-11-26 DIAGNOSIS — E063 Autoimmune thyroiditis: Secondary | ICD-10-CM | POA: Diagnosis not present

## 2015-11-26 DIAGNOSIS — M1991 Primary osteoarthritis, unspecified site: Secondary | ICD-10-CM | POA: Diagnosis not present

## 2015-11-27 DIAGNOSIS — E781 Pure hyperglyceridemia: Secondary | ICD-10-CM | POA: Diagnosis not present

## 2015-11-27 DIAGNOSIS — R7309 Other abnormal glucose: Secondary | ICD-10-CM | POA: Diagnosis not present

## 2015-11-27 DIAGNOSIS — Z1389 Encounter for screening for other disorder: Secondary | ICD-10-CM | POA: Diagnosis not present

## 2015-11-27 DIAGNOSIS — E785 Hyperlipidemia, unspecified: Secondary | ICD-10-CM | POA: Diagnosis not present

## 2015-11-27 DIAGNOSIS — I10 Essential (primary) hypertension: Secondary | ICD-10-CM | POA: Diagnosis not present

## 2015-12-04 DIAGNOSIS — M1711 Unilateral primary osteoarthritis, right knee: Secondary | ICD-10-CM | POA: Diagnosis not present

## 2015-12-05 ENCOUNTER — Encounter (INDEPENDENT_AMBULATORY_CARE_PROVIDER_SITE_OTHER): Payer: Self-pay | Admitting: *Deleted

## 2015-12-17 DIAGNOSIS — M1711 Unilateral primary osteoarthritis, right knee: Secondary | ICD-10-CM | POA: Diagnosis not present

## 2015-12-19 ENCOUNTER — Other Ambulatory Visit (HOSPITAL_COMMUNITY): Payer: Self-pay | Admitting: Internal Medicine

## 2015-12-19 DIAGNOSIS — Z1231 Encounter for screening mammogram for malignant neoplasm of breast: Secondary | ICD-10-CM

## 2015-12-24 DIAGNOSIS — M1711 Unilateral primary osteoarthritis, right knee: Secondary | ICD-10-CM | POA: Diagnosis not present

## 2015-12-27 ENCOUNTER — Other Ambulatory Visit (HOSPITAL_COMMUNITY): Payer: Self-pay | Admitting: Internal Medicine

## 2015-12-27 DIAGNOSIS — M8580 Other specified disorders of bone density and structure, unspecified site: Secondary | ICD-10-CM

## 2015-12-30 ENCOUNTER — Ambulatory Visit (INDEPENDENT_AMBULATORY_CARE_PROVIDER_SITE_OTHER): Payer: Medicare Other | Admitting: Internal Medicine

## 2015-12-31 DIAGNOSIS — M1711 Unilateral primary osteoarthritis, right knee: Secondary | ICD-10-CM | POA: Diagnosis not present

## 2016-01-01 ENCOUNTER — Encounter (INDEPENDENT_AMBULATORY_CARE_PROVIDER_SITE_OTHER): Payer: Self-pay | Admitting: Internal Medicine

## 2016-01-01 ENCOUNTER — Ambulatory Visit (INDEPENDENT_AMBULATORY_CARE_PROVIDER_SITE_OTHER): Payer: Medicare Other | Admitting: Internal Medicine

## 2016-01-01 ENCOUNTER — Encounter (INDEPENDENT_AMBULATORY_CARE_PROVIDER_SITE_OTHER): Payer: Self-pay

## 2016-01-01 ENCOUNTER — Encounter (INDEPENDENT_AMBULATORY_CARE_PROVIDER_SITE_OTHER): Payer: Self-pay | Admitting: *Deleted

## 2016-01-01 VITALS — BP 152/60 | HR 80 | Temp 97.5°F | Ht 65.0 in | Wt 161.4 lb

## 2016-01-01 DIAGNOSIS — K838 Other specified diseases of biliary tract: Secondary | ICD-10-CM | POA: Diagnosis not present

## 2016-01-01 NOTE — Progress Notes (Signed)
Subjective:    Patient ID: April Lang, female    DOB: 12-16-1940, 75 y.o.   MRN: [id number]  HPI  Here today for f/u. Referred for dilated CBC of 19m. She was last seen in April of 2016.  Hx of cholecystectomy in 2004 by Dr. JArnoldo Morale She had normal liver enzymes.  She tells me she has osteoarthritis in her rt knee. Has taken of Euflexxa yesterday.  She tells me she is doing good except for her rt knee. Appetite is good. No weight loss. She has a BM daily. No abdominal pain.   11/25/2015 ALP 74, ALT 17, AST 17, total bili 0.5, BUn 15, Glucose, 103, HA1C 5.7.  H and H 13.4 and 40.6, Platelet ct 353, K 4.6.   Hepatic Function Latest Ref Rng 12/27/2014 12/26/2013 11/15/2013  Total Protein 6.0 - 8.3 g/dL 6.4 6.9 6.9  Albumin 3.5 - 5.2 g/dL 3.9 4.4 4.5  AST 0 - 37 U/L 14 20 20   ALT 0 - 35 U/L 14 16 19   Alk Phosphatase 39 - 117 U/L 71 52 59  Total Bilirubin 0.2 - 1.2 mg/dL 0.8 0.5 0.6  Bilirubin, Direct 0.0 - 0.3 mg/dL 0.1 0.1 -        08/23/2013 CT chest with/without CM: neck swelling: Incidental imaging through the upper abdomen shows changes of prior  cholecystectomy. Increased caliber of the common bile duct measures  9 mm, image 35/series 4.  Fiducial marker placed over the left lower neck is identified on  image number 10/series 2. No underlying mass or fluid collection  identified. No adenopathy noted.  IMPRESSION:  1. No explanation for neck swelling.  2. Noncalcified nodule in the left upper lobe measures 5 mm.   01/23/2010 Colonoscopy: average risk screening Dr. RLaural Golden Normal.   Review of Systems Past Medical History  Diagnosis Date  . Hypertension   . Arthritis   . Asthma   . COPD (chronic obstructive pulmonary disease) [place]   . Thyroid disease   . Hypothyroidism   . Cystocele 01/19/2013  . Stress incontinence 01/19/2013    Past Surgical History  Procedure Laterality Date  . Cholecystectomy    . Abdominal hysterectomy    . Tubal ligation    .  Dilation and curettage of uterus    . Wrist surgery Left   . Cataract extraction    . Hand surgery Right     screws put in fingers    Allergies  Allergen Reactions  . Codeine   . Lisinopril     Cough    Current Outpatient Prescriptions on File Prior to Visit  Medication Sig Dispense Refill  . acetaminophen (TYLENOL) 500 MG tablet Take 500 mg by mouth as needed for pain.    . hydrochlorothiazide (HYDRODIURIL) 25 MG tablet TAKE ONE TABLET BY MOUTH DAILY. 90 tablet 2  . levothyroxine (SYNTHROID, LEVOTHROID) 100 MCG tablet Take 100 mcg by mouth daily before breakfast.    . losartan (COZAAR) 50 MG tablet TAKE ONE TABLET BY MOUTH DAILY. 90 tablet 3   No current facility-administered medications on file prior to visit.        Objective:   Physical ExamBlood pressure 152/60, pulse 80, temperature 97.5 F (36.4 C), height 5' 5"  (1.651 m), weight 161 lb 6.4 oz (73.211 kg). Alert and oriented. Skin warm and dry. Oral mucosa is moist.   . Sclera anicteric, conjunctivae is pink. Thyroid not enlarged. No cervical lymphadenopathy. Lungs clear. Heart regular rate and rhythm.  Abdomen is  soft. Bowel sounds are positive. No hepatomegaly. No abdominal masses felt. No tenderness.  No edema to lower extremities.          Assessment & Plan:  Dilated CBD which is common after cholecystectomy. Will repeat US x 1 and then no further work up for CBD. OV one year.

## 2016-01-01 NOTE — Patient Instructions (Signed)
[place] RUQ. OV as needed.

## 2016-01-06 ENCOUNTER — Ambulatory Visit (HOSPITAL_COMMUNITY)
Admission: RE | Admit: 2016-01-06 | Discharge: 2016-01-06 | Disposition: A | Discharge status: Home / Self Care | Payer: Medicare Other | Source: Ambulatory Visit | Attending: Internal Medicine | Admitting: Internal Medicine

## 2016-01-06 DIAGNOSIS — K838 Other specified diseases of biliary tract: Secondary | ICD-10-CM | POA: Insufficient documentation

## 2016-01-06 DIAGNOSIS — Z9049 Acquired absence of other specified parts of digestive tract: Secondary | ICD-10-CM | POA: Diagnosis not present

## 2016-01-15 ENCOUNTER — Ambulatory Visit (HOSPITAL_COMMUNITY)
Admission: RE | Admit: 2016-01-15 | Discharge: 2016-01-15 | Disposition: A | Discharge status: Home / Self Care | Payer: Medicare Other | Source: Ambulatory Visit | Attending: Internal Medicine | Admitting: Internal Medicine

## 2016-01-15 DIAGNOSIS — Z13820 Encounter for screening for osteoporosis: Secondary | ICD-10-CM | POA: Diagnosis not present

## 2016-01-15 DIAGNOSIS — M8580 Other specified disorders of bone density and structure, unspecified site: Secondary | ICD-10-CM

## 2016-01-15 DIAGNOSIS — Z1231 Encounter for screening mammogram for malignant neoplasm of breast: Secondary | ICD-10-CM | POA: Insufficient documentation

## 2016-01-15 DIAGNOSIS — M8588 Other specified disorders of bone density and structure, other site: Secondary | ICD-10-CM | POA: Diagnosis not present

## 2016-02-11 DIAGNOSIS — M25561 Pain in right knee: Secondary | ICD-10-CM | POA: Diagnosis not present

## 2016-02-11 DIAGNOSIS — M1611 Unilateral primary osteoarthritis, right hip: Secondary | ICD-10-CM | POA: Diagnosis not present

## 2016-02-11 DIAGNOSIS — M1711 Unilateral primary osteoarthritis, right knee: Secondary | ICD-10-CM | POA: Diagnosis not present

## 2016-02-18 DIAGNOSIS — H18411 Arcus senilis, right eye: Secondary | ICD-10-CM | POA: Diagnosis not present

## 2016-02-18 DIAGNOSIS — H16222 Keratoconjunctivitis sicca, not specified as Sjogren's, left eye: Secondary | ICD-10-CM | POA: Diagnosis not present

## 2016-02-18 DIAGNOSIS — H16221 Keratoconjunctivitis sicca, not specified as Sjogren's, right eye: Secondary | ICD-10-CM | POA: Diagnosis not present

## 2016-02-18 DIAGNOSIS — H02839 Dermatochalasis of unspecified eye, unspecified eyelid: Secondary | ICD-10-CM | POA: Diagnosis not present

## 2016-02-22 DIAGNOSIS — M25561 Pain in right knee: Secondary | ICD-10-CM | POA: Diagnosis not present

## 2016-02-28 DIAGNOSIS — M23361 Other meniscus derangements, other lateral meniscus, right knee: Secondary | ICD-10-CM | POA: Diagnosis not present

## 2016-02-28 DIAGNOSIS — M1711 Unilateral primary osteoarthritis, right knee: Secondary | ICD-10-CM | POA: Diagnosis not present

## 2016-03-03 ENCOUNTER — Encounter: Payer: Self-pay | Admitting: Cardiology

## 2016-03-03 ENCOUNTER — Ambulatory Visit (INDEPENDENT_AMBULATORY_CARE_PROVIDER_SITE_OTHER): Payer: Medicare Other | Admitting: Cardiology

## 2016-03-03 VITALS — BP 146/64 | HR 63 | Ht 65.0 in | Wt 163.2 lb

## 2016-03-03 DIAGNOSIS — F419 Anxiety disorder, unspecified: Secondary | ICD-10-CM | POA: Diagnosis not present

## 2016-03-03 DIAGNOSIS — I10 Essential (primary) hypertension: Secondary | ICD-10-CM

## 2016-03-03 NOTE — Patient Instructions (Addendum)
Your physician recommends that you schedule a follow-up appointment in: 1 year with Dr. [name].  *Follow up with Dr. [name] in 2 weeks for a Blood Pressure check.*  Your physician has recommended you make the following change in your medication: Take Losartan 50 mg daily.   If you need a refill on your cardiac medications before your next appointment, please call your pharmacy.

## 2016-03-03 NOTE — Progress Notes (Signed)
    03/03/2016 April Lang   21-Oct-1940  [id number]  Primary Physician Glo Herring., MD Primary Cardiologist: Dr [name]  HPI:  75 y/o female with a history of dyspnea and HTN. She had an echo in 2013. She has been treated with HCTZ and Cozaar but admits she doesn't take the Cozaar daily. "I don't like to take medications". She denies any dyspnea or chest pain. She admits her main complaint is being unable to sleep, Dr [name] has tried her on multiple medications for this which she did not tolerate.    Current Outpatient Prescriptions  Medication Sig Dispense Refill  . acetaminophen (TYLENOL) 500 MG tablet Take 500 mg by mouth as needed for pain.    . hydrochlorothiazide (HYDRODIURIL) 25 MG tablet TAKE ONE TABLET BY MOUTH DAILY. 90 tablet 2  . levothyroxine (SYNTHROID, LEVOTHROID) 100 MCG tablet Take 100 mcg by mouth daily before breakfast.    . losartan (COZAAR) 50 MG tablet TAKE ONE TABLET BY MOUTH DAILY. 90 tablet 3   No current facility-administered medications for this visit.    Allergies  Allergen Reactions  . Codeine   . Lisinopril     Cough    Social History   Social History  . Marital Status: Married    Spouse Name: N/A  . Number of Children: N/A  . Years of Education: N/A   Occupational History  . Not on file.   Social History Main Topics  . Smoking status: Never Smoker   . Smokeless tobacco: Never Used  . Alcohol Use: No  . Drug Use: No  . Sexual Activity: Not Currently    Birth Control/ Protection: Surgical     Comment: hyst   Other Topics Concern  . Not on file   Social History Narrative     Review of Systems: General: negative for chills, fever, night sweats or weight changes.  Cardiovascular: negative for chest pain, dyspnea on exertion, edema, orthopnea, palpitations, paroxysmal nocturnal dyspnea or shortness of breath Dermatological: negative for rash Respiratory: negative for cough or wheezing Urologic: negative for  hematuria Abdominal: negative for nausea, vomiting, diarrhea, bright red blood per rectum, melena, or hematemesis Neurologic: negative for visual changes, syncope, or dizziness All other systems reviewed and are otherwise negative except as noted above.    Blood pressure 146/64, pulse 63, height 5\' 5"  (1.651 m), weight 163 lb 3.2 oz (74.027 kg).  General appearance: alert, cooperative and no distress Neck: no carotid bruit and no JVD Lungs: clear to auscultation bilaterally Heart: regular rate and rhythm Extremities: extremities normal, atraumatic, no cyanosis or edema Skin: Skin color, texture, turgor normal. No rashes or lesions Neurologic: Grossly normal  EKG NSR, LVH, LAD  ASSESSMENT AND PLAN:   Essential hypertension She has only been taking her Cozaar twice a week-  Anxiety She admits to stress and anxiety since her parent died (in they're 43's) 4 years ago.    PLAN  I suggested we change her to Hyzaar 25/15 and follow her B/P but she didn't want to do this. She says she'll take the Cozaar 50 mg daily and follow up with Dr [name]- she has an appointment tomorrow with him. I encouraged her to have a follow up B/P after two weeks of daily Cozaar and HCTZ. She can see Dr [name] in a year.   Kerin Ransom PA-C 03/03/2016 2:25 PM

## 2016-03-03 NOTE — Assessment & Plan Note (Signed)
She admits to stress and anxiety since her parent died (in they're 28's) 4 years ago.

## 2016-03-03 NOTE — Assessment & Plan Note (Signed)
She has only been taking her Cozaar twice a week-

## 2016-03-04 DIAGNOSIS — E663 Overweight: Secondary | ICD-10-CM | POA: Diagnosis not present

## 2016-03-04 DIAGNOSIS — Z0000 Encounter for general adult medical examination without abnormal findings: Secondary | ICD-10-CM | POA: Diagnosis not present

## 2016-03-04 DIAGNOSIS — Z6827 Body mass index (BMI) 27.0-27.9, adult: Secondary | ICD-10-CM | POA: Diagnosis not present

## 2016-03-09 ENCOUNTER — Other Ambulatory Visit: Payer: Self-pay | Admitting: Cardiovascular Disease

## 2016-05-14 DIAGNOSIS — Z23 Encounter for immunization: Secondary | ICD-10-CM | POA: Diagnosis not present

## 2016-05-26 ENCOUNTER — Other Ambulatory Visit (HOSPITAL_COMMUNITY): Payer: Self-pay | Admitting: Internal Medicine

## 2016-05-26 DIAGNOSIS — E782 Mixed hyperlipidemia: Secondary | ICD-10-CM | POA: Diagnosis not present

## 2016-05-26 DIAGNOSIS — M1991 Primary osteoarthritis, unspecified site: Secondary | ICD-10-CM | POA: Diagnosis not present

## 2016-05-26 DIAGNOSIS — E063 Autoimmune thyroiditis: Secondary | ICD-10-CM | POA: Diagnosis not present

## 2016-05-26 DIAGNOSIS — R202 Paresthesia of skin: Secondary | ICD-10-CM | POA: Diagnosis not present

## 2016-05-26 DIAGNOSIS — E785 Hyperlipidemia, unspecified: Secondary | ICD-10-CM | POA: Diagnosis not present

## 2016-05-26 DIAGNOSIS — K219 Gastro-esophageal reflux disease without esophagitis: Secondary | ICD-10-CM | POA: Diagnosis not present

## 2016-05-26 DIAGNOSIS — E663 Overweight: Secondary | ICD-10-CM | POA: Diagnosis not present

## 2016-05-26 DIAGNOSIS — I10 Essential (primary) hypertension: Secondary | ICD-10-CM | POA: Diagnosis not present

## 2016-05-26 DIAGNOSIS — Z6828 Body mass index (BMI) 28.0-28.9, adult: Secondary | ICD-10-CM | POA: Diagnosis not present

## 2016-05-26 DIAGNOSIS — Z1389 Encounter for screening for other disorder: Secondary | ICD-10-CM | POA: Diagnosis not present

## 2016-06-01 ENCOUNTER — Ambulatory Visit (HOSPITAL_COMMUNITY)
Admission: RE | Admit: 2016-06-01 | Discharge: 2016-06-01 | Disposition: A | Discharge status: Home / Self Care | Payer: Medicare Other | Source: Ambulatory Visit | Attending: Internal Medicine | Admitting: Internal Medicine

## 2016-06-01 DIAGNOSIS — I6522 Occlusion and stenosis of left carotid artery: Secondary | ICD-10-CM | POA: Insufficient documentation

## 2016-06-01 DIAGNOSIS — R202 Paresthesia of skin: Secondary | ICD-10-CM | POA: Insufficient documentation

## 2016-06-01 DIAGNOSIS — I6523 Occlusion and stenosis of bilateral carotid arteries: Secondary | ICD-10-CM | POA: Diagnosis not present

## 2016-06-04 ENCOUNTER — Other Ambulatory Visit: Payer: Self-pay | Admitting: Cardiovascular Disease

## 2016-06-04 NOTE — Telephone Encounter (Signed)
Rx request sent to pharmacy.  

## 2016-10-22 ENCOUNTER — Other Ambulatory Visit (HOSPITAL_COMMUNITY): Payer: Self-pay | Admitting: Internal Medicine

## 2016-10-22 DIAGNOSIS — E063 Autoimmune thyroiditis: Secondary | ICD-10-CM | POA: Diagnosis not present

## 2016-10-22 DIAGNOSIS — M1991 Primary osteoarthritis, unspecified site: Secondary | ICD-10-CM | POA: Diagnosis not present

## 2016-10-22 DIAGNOSIS — E663 Overweight: Secondary | ICD-10-CM | POA: Diagnosis not present

## 2016-10-22 DIAGNOSIS — M47812 Spondylosis without myelopathy or radiculopathy, cervical region: Secondary | ICD-10-CM | POA: Diagnosis not present

## 2016-10-22 DIAGNOSIS — Z6829 Body mass index (BMI) 29.0-29.9, adult: Secondary | ICD-10-CM | POA: Diagnosis not present

## 2016-10-22 DIAGNOSIS — Z1389 Encounter for screening for other disorder: Secondary | ICD-10-CM | POA: Diagnosis not present

## 2016-10-22 DIAGNOSIS — M5412 Radiculopathy, cervical region: Secondary | ICD-10-CM | POA: Diagnosis not present

## 2016-10-22 DIAGNOSIS — E039 Hypothyroidism, unspecified: Secondary | ICD-10-CM | POA: Diagnosis not present

## 2016-10-22 DIAGNOSIS — J385 Laryngeal spasm: Secondary | ICD-10-CM | POA: Diagnosis not present

## 2016-10-22 DIAGNOSIS — R1310 Dysphagia, unspecified: Secondary | ICD-10-CM

## 2016-10-22 DIAGNOSIS — R0600 Dyspnea, unspecified: Secondary | ICD-10-CM | POA: Diagnosis not present

## 2016-10-27 ENCOUNTER — Ambulatory Visit (HOSPITAL_COMMUNITY)
Admission: RE | Admit: 2016-10-27 | Discharge: 2016-10-27 | Disposition: A | Discharge status: Home / Self Care | Payer: Medicare Other | Source: Ambulatory Visit | Attending: Internal Medicine | Admitting: Internal Medicine

## 2016-10-27 DIAGNOSIS — R1310 Dysphagia, unspecified: Secondary | ICD-10-CM

## 2016-10-27 DIAGNOSIS — K224 Dyskinesia of esophagus: Secondary | ICD-10-CM | POA: Diagnosis not present

## 2016-11-06 ENCOUNTER — Encounter (INDEPENDENT_AMBULATORY_CARE_PROVIDER_SITE_OTHER): Payer: Self-pay | Admitting: Internal Medicine

## 2016-11-16 ENCOUNTER — Other Ambulatory Visit: Payer: Self-pay | Admitting: Cardiovascular Disease

## 2016-11-17 ENCOUNTER — Ambulatory Visit (INDEPENDENT_AMBULATORY_CARE_PROVIDER_SITE_OTHER): Payer: Medicare Other | Admitting: Internal Medicine

## 2016-11-17 ENCOUNTER — Encounter (INDEPENDENT_AMBULATORY_CARE_PROVIDER_SITE_OTHER): Payer: Self-pay | Admitting: Internal Medicine

## 2016-11-17 VITALS — BP 150/66 | HR 56 | Temp 97.5°F | Ht 65.0 in | Wt 161.3 lb

## 2016-11-17 DIAGNOSIS — R1319 Other dysphagia: Secondary | ICD-10-CM

## 2016-11-17 DIAGNOSIS — R1310 Dysphagia, unspecified: Secondary | ICD-10-CM

## 2016-11-17 NOTE — Progress Notes (Signed)
   Subjective:    Patient ID: April Lang, female    DOB: Aug 11, 1941, 76 y.o.   MRN: [id number]  HPI Referred by Dr. [name] for GERD.  Saw Dr. [name] for GERD and dysphagia. She says one night she woke up and she felt like she was choking. She was gasping for air. Her husband started hitting on her back. She says her air was completely cut off.  She was started on Protonix. She says the Protonix has helped. Her acid reflux is controlled.  She occasionally strangles on water.  Appetite is good. No weight loss.  Usually has a BM daily. No melena or BRRB.  10/27/2016 DG esophagram;   IMPRESSION: 1. Nonspecific esophageal motility disorder with occasional mild tertiary contractions. 2. Otherwise, normal esophagram. esophageal ring. No hiatal hernia. Water siphon test demonstrated no gastroesophageal reflux. A barium tablet was administered, which passed readily into the stomach.   Review of Systems Past Medical History:  Diagnosis Date  . Arthritis   . Asthma   . COPD (chronic obstructive pulmonary disease) ([place])   . Cystocele 01/19/2013  . Hypertension   . Hypothyroidism   . Stress incontinence 01/19/2013  . Thyroid disease     Past Surgical History:  Procedure Laterality Date  . ABDOMINAL HYSTERECTOMY    . CATARACT EXTRACTION    . CHOLECYSTECTOMY    . DILATION AND CURETTAGE OF UTERUS    . HAND SURGERY Right    screws put in fingers  . TUBAL LIGATION    . WRIST SURGERY Left     Allergies  Allergen Reactions  . Codeine   . Lisinopril     Cough    Current Outpatient Prescriptions on File Prior to Visit  Medication Sig Dispense Refill  . acetaminophen (TYLENOL) 500 MG tablet Take 500 mg by mouth as needed for pain.    . hydrochlorothiazide (HYDRODIURIL) 25 MG tablet TAKE ONE TABLET BY MOUTH DAILY. 90 tablet 3  . levothyroxine (SYNTHROID, LEVOTHROID) 100 MCG tablet Take 100 mcg by mouth daily before breakfast.    . losartan (COZAAR) 50 MG tablet TAKE ONE TABLET BY  MOUTH DAILY. 90 tablet 0   No current facility-administered medications on file prior to visit.        Objective:   Physical Exam Blood pressure (!) 150/66, pulse (!) 56, temperature 97.5 F (36.4 C), height 5\' 5"  (1.651 m), weight 161 lb 4.8 oz (73.2 kg). Alert and oriented. Skin warm and dry. Oral mucosa is moist.   . Sclera anicteric, conjunctivae is pink. Thyroid not enlarged. No cervical lymphadenopathy. Lungs clear. Heart regular rate and rhythm.  Abdomen is soft. Bowel sounds are positive. No hepatomegaly. No abdominal masses felt. No tenderness.  No edema to lower extremities.         Assessment & Plan:  Dysphagia. Patient has a esophageal  disorder. She is chewing her foods well. She states she is not having trouble swallowing solid foods. OV in 1 year. No further work up for dysphagia at this time.

## 2016-11-17 NOTE — Patient Instructions (Signed)
OV in 1 year.  

## 2016-11-30 ENCOUNTER — Other Ambulatory Visit (HOSPITAL_COMMUNITY): Payer: Self-pay | Admitting: Internal Medicine

## 2016-11-30 DIAGNOSIS — Z1231 Encounter for screening mammogram for malignant neoplasm of breast: Secondary | ICD-10-CM

## 2016-12-28 DIAGNOSIS — Z6827 Body mass index (BMI) 27.0-27.9, adult: Secondary | ICD-10-CM | POA: Diagnosis not present

## 2016-12-28 DIAGNOSIS — E663 Overweight: Secondary | ICD-10-CM | POA: Diagnosis not present

## 2016-12-28 DIAGNOSIS — M542 Cervicalgia: Secondary | ICD-10-CM | POA: Diagnosis not present

## 2017-01-20 ENCOUNTER — Ambulatory Visit (HOSPITAL_COMMUNITY)
Admission: RE | Admit: 2017-01-20 | Discharge: 2017-01-20 | Disposition: A | Discharge status: Home / Self Care | Payer: Medicare Other | Source: Ambulatory Visit | Attending: Internal Medicine | Admitting: Internal Medicine

## 2017-01-20 DIAGNOSIS — Z1231 Encounter for screening mammogram for malignant neoplasm of breast: Secondary | ICD-10-CM | POA: Insufficient documentation

## 2017-01-26 ENCOUNTER — Encounter: Payer: Self-pay | Admitting: Adult Health

## 2017-01-26 ENCOUNTER — Ambulatory Visit (INDEPENDENT_AMBULATORY_CARE_PROVIDER_SITE_OTHER): Payer: Medicare Other | Admitting: Adult Health

## 2017-01-26 VITALS — BP 160/68 | HR 66 | Ht 64.0 in | Wt 157.5 lb

## 2017-01-26 DIAGNOSIS — E7800 Pure hypercholesterolemia, unspecified: Secondary | ICD-10-CM | POA: Diagnosis not present

## 2017-01-26 DIAGNOSIS — E039 Hypothyroidism, unspecified: Secondary | ICD-10-CM | POA: Diagnosis not present

## 2017-01-26 DIAGNOSIS — Z131 Encounter for screening for diabetes mellitus: Secondary | ICD-10-CM

## 2017-01-26 DIAGNOSIS — Z01419 Encounter for gynecological examination (general) (routine) without abnormal findings: Secondary | ICD-10-CM | POA: Diagnosis not present

## 2017-01-26 DIAGNOSIS — R7309 Other abnormal glucose: Secondary | ICD-10-CM | POA: Diagnosis not present

## 2017-01-26 DIAGNOSIS — Z1212 Encounter for screening for malignant neoplasm of rectum: Secondary | ICD-10-CM

## 2017-01-26 DIAGNOSIS — F419 Anxiety disorder, unspecified: Secondary | ICD-10-CM

## 2017-01-26 DIAGNOSIS — I10 Essential (primary) hypertension: Secondary | ICD-10-CM

## 2017-01-26 DIAGNOSIS — Z1211 Encounter for screening for malignant neoplasm of colon: Secondary | ICD-10-CM

## 2017-01-26 LAB — HEMOCCULT GUIAC POC 1CARD (OFFICE): Fecal Occult Blood, POC: NEGATIVE

## 2017-01-26 MED ORDER — LORAZEPAM 0.5 MG PO TABS: ORAL_TABLET | ORAL | 0 refills | Status: DC

## 2017-01-26 NOTE — Progress Notes (Signed)
Patient ID: April Lang, female   DOB: Mar 28, 1941, 76 y.o.   MRN: [id number] History of Present Illness: April Lang is a 76 year old white female, married in for a well woman gyn exam, she is sp hysterectomy.She requests labs.  PCP is Dr [name].    Current Medications, Allergies, Past Medical History, Past Surgical History, Family History and Social History were reviewed in [facility] record.     Review of Systems: Patient denies any headaches, hearing loss, fatigue, blurred vision, shortness of breath, chest pain, abdominal pain, problems with bowel movements, urination, or intercourse(not having sex). No joint pain(knees bother her at times) or mood swings. +night sweats, and stressed over family health issues   Physical Exam:BP (!) 160/68 (BP Location: Left Arm, Cuff Size: Normal)   Pulse 66   Ht 5\' 4"  (1.626 m)   Wt 157 lb 8 oz (71.4 kg)   BMI 27.03 kg/m  General:  Well developed, well nourished, no acute distress Skin:  Warm and dry Neck:  Midline trachea, normal thyroid, good ROM, no lymphadenopathy, no carotid bruits heard, has fatty pad above left clavicle. Lungs; Clear to auscultation bilaterally Breast:  No dominant palpable mass, retraction, or nipple discharge Cardiovascular: Regular rate and rhythm Abdomen:  Soft, non tender, no hepatosplenomegaly Pelvic:  External genitalia is normal in appearance, no lesions.  The vagina is normal in appearance. Urethra has no lesions or masses. The cervix and uterus are absent.  No adnexal masses or tenderness noted.Bladder is non tender, no masses felt. Rectal: Good sphincter tone, no polyps, or hemorrhoids felt.  Hemoccult negative. Extremities/musculoskeletal:  No swelling or varicosities noted, no clubbing or cyanosis Psych:  No mood changes, alert and cooperative,seems happy PHQ 2 score 1, she requests meds for "nerves" to use prn.   Impression:  Well woman exam with routine gynecological exam - Plan: CBC  With Differential  Screening for colorectal cancer - Plan: POCT occult blood stool  Screening for diabetes mellitus - Plan: Hemoglobin A1c  Elevated cholesterol - Plan: Lipid panel  Hypothyroidism, unspecified type - Plan: TSH, T4, free  Essential hypertension - Plan: Comprehensive metabolic panel, CBC With Differential  Elevated hemoglobin A1c - Plan: Hemoglobin A1c  Anxiety     Plan: Check CBC,CMP,TSH and lipids,A1c and free T4 Rx ativan 0.5 mg #30 take 1 bid prn anxiety  Physical in 2 years Mammogram yearly Try evening primrose oil

## 2017-01-27 LAB — CBC WITH DIFFERENTIAL
BASOS: 1 %
Basophils Absolute: 0.1 10*3/uL (ref 0.0–0.2)
EOS (ABSOLUTE): 0.3 10*3/uL (ref 0.0–0.4)
EOS: 3 %
HEMOGLOBIN: 13.7 g/dL (ref 11.1–15.9)
Hematocrit: 41.3 % (ref 34.0–46.6)
IMMATURE GRANS (ABS): 0 10*3/uL (ref 0.0–0.1)
Immature Granulocytes: 0 %
LYMPHS: 22 %
Lymphocytes Absolute: 2 10*3/uL (ref 0.7–3.1)
MCH: 32.9 pg (ref 26.6–33.0)
MCHC: 33.2 g/dL (ref 31.5–35.7)
MCV: 99 fL — ABNORMAL HIGH (ref 79–97)
MONOCYTES: 6 %
Monocytes Absolute: 0.5 10*3/uL (ref 0.1–0.9)
NEUTROS ABS: 6.2 10*3/uL (ref 1.4–7.0)
NEUTROS PCT: 68 %
RBC: 4.17 x10E6/uL (ref 3.77–5.28)
RDW: 13.7 % (ref 12.3–15.4)
WBC: 9.1 10*3/uL (ref 3.4–10.8)

## 2017-01-27 LAB — COMPREHENSIVE METABOLIC PANEL
ALBUMIN: 4.5 g/dL (ref 3.5–4.8)
ALK PHOS: 72 IU/L (ref 39–117)
ALT: 19 IU/L (ref 0–32)
AST: 20 IU/L (ref 0–40)
Albumin/Globulin Ratio: 1.8 (ref 1.2–2.2)
BILIRUBIN TOTAL: 0.7 mg/dL (ref 0.0–1.2)
BUN/Creatinine Ratio: 14 (ref 12–28)
BUN: 14 mg/dL (ref 8–27)
CALCIUM: 9.9 mg/dL (ref 8.7–10.3)
CHLORIDE: 94 mmol/L — AB (ref 96–106)
CO2: 30 mmol/L — AB (ref 18–29)
Creatinine, Ser: 1.03 mg/dL — ABNORMAL HIGH (ref 0.57–1.00)
GFR calc Af Amer: 61 mL/min/{1.73_m2} (ref >59)
GFR calc non Af Amer: 53 mL/min/{1.73_m2} — ABNORMAL LOW (ref >59)
GLOBULIN, TOTAL: 2.5 g/dL (ref 1.5–4.5)
GLUCOSE: 101 mg/dL — AB (ref 65–99)
POTASSIUM: 5 mmol/L (ref 3.5–5.2)
Sodium: 139 mmol/L (ref 134–144)
Total Protein: 7 g/dL (ref 6.0–8.5)

## 2017-01-27 LAB — LIPID PANEL
CHOL/HDL RATIO: 3.3 ratio (ref 0.0–4.4)
CHOLESTEROL TOTAL: 202 mg/dL — AB (ref 100–199)
HDL: 61 mg/dL (ref >39)
LDL Calculated: 108 mg/dL — ABNORMAL HIGH (ref 0–99)
TRIGLYCERIDES: 166 mg/dL — AB (ref 0–149)
VLDL Cholesterol Cal: 33 mg/dL (ref 5–40)

## 2017-01-27 LAB — TSH: TSH: 0.587 u[IU]/mL (ref 0.450–4.500)

## 2017-01-27 LAB — HEMOGLOBIN A1C
Est. average glucose Bld gHb Est-mCnc: 117 mg/dL
Hgb A1c MFr Bld: 5.7 % — ABNORMAL HIGH (ref 4.8–5.6)

## 2017-01-27 LAB — T4, FREE: Free T4: 2.13 ng/dL — ABNORMAL HIGH (ref 0.82–1.77)

## 2017-01-28 ENCOUNTER — Telehealth: Payer: Self-pay | Admitting: Adult Health

## 2017-01-28 NOTE — Telephone Encounter (Signed)
Pt aware of labs  

## 2017-02-16 ENCOUNTER — Ambulatory Visit (INDEPENDENT_AMBULATORY_CARE_PROVIDER_SITE_OTHER): Payer: Medicare Other | Admitting: Cardiovascular Disease

## 2017-02-16 ENCOUNTER — Encounter: Payer: Self-pay | Admitting: Cardiovascular Disease

## 2017-02-16 VITALS — BP 142/70 | HR 65 | Ht 64.0 in | Wt 160.0 lb

## 2017-02-16 DIAGNOSIS — E7800 Pure hypercholesterolemia, unspecified: Secondary | ICD-10-CM

## 2017-02-16 DIAGNOSIS — I779 Disorder of arteries and arterioles, unspecified: Secondary | ICD-10-CM | POA: Diagnosis not present

## 2017-02-16 DIAGNOSIS — I10 Essential (primary) hypertension: Secondary | ICD-10-CM | POA: Diagnosis not present

## 2017-02-16 DIAGNOSIS — I739 Peripheral vascular disease, unspecified: Secondary | ICD-10-CM

## 2017-02-16 NOTE — Progress Notes (Signed)
02/16/2017 April Lang   [birth year]  [id number]  Primary Physician Redmond School, MD Primary Cardiologist: Lorretta Harp MD Renae Gloss  HPI:   The patient returns today for followup. I last saw her in the office 02/27/15 She had a MET test in the past that was negative for ischemia, an echo that showed increased filling pressures. I began her on a diuretic to which she has had a variable response. She does have an ACE cough and was started on losartan instead. She denies chest pain or shortness of breath. Her primary care physician follows her lipid profile closely which was most recently performed 01/26/17. Total cholesterol 202, LDL 108 and HDL of 61.  Current Outpatient Prescriptions  Medication Sig Dispense Refill  . acetaminophen (TYLENOL) 500 MG tablet Take 500 mg by mouth as needed for pain.    Marland Kitchen aspirin 81 MG chewable tablet Chew by mouth daily.    . Calcium Carb-Cholecalciferol (CALCIUM 600+D3 PO) Take by mouth. Takes 2 daily    . hydrochlorothiazide (HYDRODIURIL) 25 MG tablet TAKE ONE TABLET BY MOUTH DAILY. 90 tablet 3  . levothyroxine (SYNTHROID, LEVOTHROID) 100 MCG tablet Take 100 mcg by mouth daily before breakfast.    . losartan (COZAAR) 50 MG tablet TAKE ONE TABLET BY MOUTH DAILY. (Patient taking differently: TAKE ONE TABLET BY MOUTH DAILY; SOMEDAYS TAKES HALF A TAB) 90 tablet 0   No current facility-administered medications for this visit.     Allergies  Allergen Reactions  . Codeine   . Lisinopril     Cough    Social History   Social History  . Marital status: Married    Spouse name: N/A  . Number of children: N/A  . Years of education: N/A   Occupational History  . Not on file.   Social History Main Topics  . Smoking status: Never Smoker  . Smokeless tobacco: Never Used  . Alcohol use No  . Drug use: No  . Sexual activity: Not Currently    Birth control/ protection: Surgical     Comment: hyst   Other Topics Concern  . Not  on file   Social History Narrative  . No narrative on file     Review of Systems: General: negative for chills, fever, night sweats or weight changes.  Cardiovascular: negative for chest pain, dyspnea on exertion, edema, orthopnea, palpitations, paroxysmal nocturnal dyspnea or shortness of breath Dermatological: negative for rash Respiratory: negative for cough or wheezing Urologic: negative for hematuria Abdominal: negative for nausea, vomiting, diarrhea, bright red blood per rectum, melena, or hematemesis Neurologic: negative for visual changes, syncope, or dizziness All other systems reviewed and are otherwise negative except as noted above.    Blood pressure (!) 142/70, pulse 65, height _0  (1.626 m), weight 160 lb (72.6 kg).  General appearance: alert and no distress Neck: no adenopathy, no carotid bruit, no JVD, supple, symmetrical, trachea midline and thyroid not enlarged, symmetric, no tenderness/mass/nodules Lungs: clear to auscultation bilaterally Heart: regular rate and rhythm, S1, S2 normal, no murmur, click, rub or gallop Extremities: extremities normal, atraumatic, no cyanosis or edema  EKG sinus rhythm at 65 with left axis deviation. I personally reviewed this EKG.  ASSESSMENT AND PLAN:   Essential hypertension History of essential hypertension blood pressure today 142/70. She is on hydrochlorothiazide and losartan. Continue current meds at current dosing.  Elevated cholesterol History of hyperlipidemia morning not on statin therapy followed by her PCP. Most recent lab work  performed 01/26/17 revealed a total cholesterol 202, LDL 108 and HDL of 61.  Carotid artery disease ([place]) History of bilateral ICA stenosis by duplex ultrasound performed 06/01/16. We will recheck carotid Doppler studies.      Lorretta Harp MD FACP,FACC,FAHA, [facility] 02/16/2017 10:05 AM

## 2017-02-16 NOTE — Patient Instructions (Signed)
Medication Instructions: Your physician recommends that you continue on your current medications as directed. Please refer to the Current Medication list given to you today.  Testing/Procedures: Your physician has requested that you have a carotid duplex. This test is an ultrasound of the carotid arteries in your neck. It looks at blood flow through these arteries that supply the brain with blood. Allow one hour for this exam. There are no restrictions or special instructions.  Follow-Up: Your physician wants you to follow-up in: 1 year with Dr. Berry. You will receive a reminder letter in the mail two months in advance. If you don't receive a letter, please call our office to schedule the follow-up appointment.  If you need a refill on your cardiac medications before your next appointment, please call your pharmacy.  

## 2017-02-16 NOTE — Assessment & Plan Note (Signed)
History of essential hypertension blood pressure today 142/70. She is on hydrochlorothiazide and losartan. Continue current meds at current dosing.

## 2017-02-16 NOTE — Assessment & Plan Note (Signed)
History of hyperlipidemia morning not on statin therapy followed by her PCP. Most recent lab work performed 01/26/17 revealed a total cholesterol 202, LDL 108 and HDL of 61.

## 2017-02-16 NOTE — Assessment & Plan Note (Signed)
History of bilateral ICA stenosis by duplex ultrasound performed 06/01/16. We will recheck carotid Doppler studies.

## 2017-02-23 DIAGNOSIS — I10 Essential (primary) hypertension: Secondary | ICD-10-CM | POA: Diagnosis not present

## 2017-02-23 DIAGNOSIS — E119 Type 2 diabetes mellitus without complications: Secondary | ICD-10-CM | POA: Diagnosis not present

## 2017-02-23 DIAGNOSIS — Z961 Presence of intraocular lens: Secondary | ICD-10-CM | POA: Diagnosis not present

## 2017-02-23 DIAGNOSIS — H02839 Dermatochalasis of unspecified eye, unspecified eyelid: Secondary | ICD-10-CM | POA: Diagnosis not present

## 2017-03-09 ENCOUNTER — Ambulatory Visit (HOSPITAL_COMMUNITY)
Admission: RE | Admit: 2017-03-09 | Discharge: 2017-03-09 | Disposition: A | Discharge status: Home / Self Care | Payer: Medicare Other | Source: Ambulatory Visit | Attending: Cardiology | Admitting: Cardiology

## 2017-03-09 DIAGNOSIS — I6523 Occlusion and stenosis of bilateral carotid arteries: Secondary | ICD-10-CM | POA: Diagnosis not present

## 2017-03-09 DIAGNOSIS — I739 Peripheral vascular disease, unspecified: Secondary | ICD-10-CM

## 2017-03-09 DIAGNOSIS — I779 Disorder of arteries and arterioles, unspecified: Secondary | ICD-10-CM | POA: Diagnosis not present

## 2017-03-11 ENCOUNTER — Other Ambulatory Visit: Payer: Self-pay | Admitting: Cardiovascular Disease

## 2017-03-11 DIAGNOSIS — I779 Disorder of arteries and arterioles, unspecified: Secondary | ICD-10-CM

## 2017-03-11 DIAGNOSIS — I739 Peripheral vascular disease, unspecified: Principal | ICD-10-CM

## 2017-03-15 ENCOUNTER — Telehealth: Payer: Self-pay | Admitting: Adult Health

## 2017-03-15 NOTE — Telephone Encounter (Signed)
Pt got bill, about labs to talk with Tammy, she had copy of bill.

## 2017-03-15 NOTE — Telephone Encounter (Signed)
Left message I called back

## 2017-04-05 DIAGNOSIS — Z6826 Body mass index (BMI) 26.0-26.9, adult: Secondary | ICD-10-CM | POA: Diagnosis not present

## 2017-04-05 DIAGNOSIS — E063 Autoimmune thyroiditis: Secondary | ICD-10-CM | POA: Diagnosis not present

## 2017-04-05 DIAGNOSIS — K219 Gastro-esophageal reflux disease without esophagitis: Secondary | ICD-10-CM | POA: Diagnosis not present

## 2017-04-05 DIAGNOSIS — M542 Cervicalgia: Secondary | ICD-10-CM | POA: Diagnosis not present

## 2017-04-06 ENCOUNTER — Other Ambulatory Visit (HOSPITAL_COMMUNITY): Payer: Self-pay | Admitting: Internal Medicine

## 2017-04-06 ENCOUNTER — Ambulatory Visit (HOSPITAL_COMMUNITY)
Admission: RE | Admit: 2017-04-06 | Discharge: 2017-04-06 | Disposition: A | Discharge status: Home / Self Care | Payer: Medicare Other | Source: Ambulatory Visit | Attending: Internal Medicine | Admitting: Internal Medicine

## 2017-04-06 DIAGNOSIS — M1288 Other specific arthropathies, not elsewhere classified, other specified site: Secondary | ICD-10-CM | POA: Diagnosis not present

## 2017-04-06 DIAGNOSIS — M47813 Spondylosis without myelopathy or radiculopathy, cervicothoracic region: Secondary | ICD-10-CM | POA: Diagnosis not present

## 2017-04-06 DIAGNOSIS — M47812 Spondylosis without myelopathy or radiculopathy, cervical region: Secondary | ICD-10-CM | POA: Insufficient documentation

## 2017-04-06 DIAGNOSIS — M542 Cervicalgia: Secondary | ICD-10-CM | POA: Diagnosis present

## 2017-04-06 DIAGNOSIS — M50221 Other cervical disc displacement at C4-C5 level: Secondary | ICD-10-CM | POA: Insufficient documentation

## 2017-05-06 ENCOUNTER — Other Ambulatory Visit: Payer: Self-pay | Admitting: Neurosurgery

## 2017-05-06 DIAGNOSIS — M5412 Radiculopathy, cervical region: Secondary | ICD-10-CM | POA: Diagnosis not present

## 2017-05-10 ENCOUNTER — Other Ambulatory Visit: Payer: Self-pay | Admitting: Cardiovascular Disease

## 2017-05-11 ENCOUNTER — Ambulatory Visit
Admission: RE | Admit: 2017-05-11 | Discharge: 2017-05-11 | Disposition: A | Discharge status: Home / Self Care | Payer: Medicare Other | Source: Ambulatory Visit | Attending: Neurosurgery | Admitting: Neurosurgery

## 2017-05-11 DIAGNOSIS — M4802 Spinal stenosis, cervical region: Secondary | ICD-10-CM | POA: Diagnosis not present

## 2017-05-11 DIAGNOSIS — M5412 Radiculopathy, cervical region: Secondary | ICD-10-CM

## 2017-05-11 NOTE — Telephone Encounter (Signed)
REFILL 

## 2017-05-12 DIAGNOSIS — Z6826 Body mass index (BMI) 26.0-26.9, adult: Secondary | ICD-10-CM | POA: Diagnosis not present

## 2017-05-12 DIAGNOSIS — I10 Essential (primary) hypertension: Secondary | ICD-10-CM | POA: Diagnosis not present

## 2017-05-12 DIAGNOSIS — M5412 Radiculopathy, cervical region: Secondary | ICD-10-CM | POA: Diagnosis not present

## 2017-05-17 DIAGNOSIS — M2560 Stiffness of unspecified joint, not elsewhere classified: Secondary | ICD-10-CM | POA: Diagnosis not present

## 2017-05-17 DIAGNOSIS — M791 Myalgia: Secondary | ICD-10-CM | POA: Diagnosis not present

## 2017-05-17 DIAGNOSIS — M542 Cervicalgia: Secondary | ICD-10-CM | POA: Diagnosis not present

## 2017-05-17 DIAGNOSIS — M546 Pain in thoracic spine: Secondary | ICD-10-CM | POA: Diagnosis not present

## 2017-05-20 DIAGNOSIS — M791 Myalgia: Secondary | ICD-10-CM | POA: Diagnosis not present

## 2017-05-20 DIAGNOSIS — M2560 Stiffness of unspecified joint, not elsewhere classified: Secondary | ICD-10-CM | POA: Diagnosis not present

## 2017-05-20 DIAGNOSIS — M542 Cervicalgia: Secondary | ICD-10-CM | POA: Diagnosis not present

## 2017-05-20 DIAGNOSIS — M546 Pain in thoracic spine: Secondary | ICD-10-CM | POA: Diagnosis not present

## 2017-05-22 ENCOUNTER — Encounter (HOSPITAL_COMMUNITY): Payer: Self-pay | Admitting: Emergency Medicine

## 2017-05-22 ENCOUNTER — Emergency Department (HOSPITAL_COMMUNITY): Payer: Medicare Other

## 2017-05-22 ENCOUNTER — Emergency Department (HOSPITAL_COMMUNITY)
Admission: EM | Admit: 2017-05-22 | Discharge: 2017-05-22 | Disposition: A | Discharge status: Home / Self Care | Payer: Medicare Other | Attending: Emergency Medicine | Admitting: Emergency Medicine

## 2017-05-22 DIAGNOSIS — R509 Fever, unspecified: Secondary | ICD-10-CM | POA: Diagnosis not present

## 2017-05-22 DIAGNOSIS — Z7982 Long term (current) use of aspirin: Secondary | ICD-10-CM | POA: Insufficient documentation

## 2017-05-22 DIAGNOSIS — Z79899 Other long term (current) drug therapy: Secondary | ICD-10-CM | POA: Diagnosis not present

## 2017-05-22 DIAGNOSIS — J45909 Unspecified asthma, uncomplicated: Secondary | ICD-10-CM | POA: Insufficient documentation

## 2017-05-22 DIAGNOSIS — I10 Essential (primary) hypertension: Secondary | ICD-10-CM | POA: Diagnosis not present

## 2017-05-22 DIAGNOSIS — E039 Hypothyroidism, unspecified: Secondary | ICD-10-CM | POA: Insufficient documentation

## 2017-05-22 DIAGNOSIS — R05 Cough: Secondary | ICD-10-CM | POA: Diagnosis not present

## 2017-05-22 DIAGNOSIS — I2510 Atherosclerotic heart disease of native coronary artery without angina pectoris: Secondary | ICD-10-CM | POA: Diagnosis not present

## 2017-05-22 DIAGNOSIS — J449 Chronic obstructive pulmonary disease, unspecified: Secondary | ICD-10-CM | POA: Diagnosis not present

## 2017-05-22 LAB — URINALYSIS, ROUTINE W REFLEX MICROSCOPIC
BILIRUBIN URINE: NEGATIVE
GLUCOSE, UA: NEGATIVE mg/dL
HGB URINE DIPSTICK: NEGATIVE
Ketones, ur: NEGATIVE mg/dL
LEUKOCYTES UA: NEGATIVE
Nitrite: NEGATIVE
Protein, ur: NEGATIVE mg/dL
Specific Gravity, Urine: 1.006 (ref 1.005–1.030)
pH: 7 (ref 5.0–8.0)

## 2017-05-22 LAB — CBC WITH DIFFERENTIAL/PLATELET
BASOS ABS: 0 10*3/uL (ref 0.0–0.1)
Basophils Relative: 0 %
Eosinophils Absolute: 0.1 10*3/uL (ref 0.0–0.7)
Eosinophils Relative: 1 %
HCT: 35.7 % — ABNORMAL LOW (ref 36.0–46.0)
Hemoglobin: 12.2 g/dL (ref 12.0–15.0)
LYMPHS PCT: 7 %
Lymphs Abs: 0.8 10*3/uL (ref 0.7–4.0)
MCH: 33.2 pg (ref 26.0–34.0)
MCHC: 34.2 g/dL (ref 30.0–36.0)
MCV: 97.3 fL (ref 78.0–100.0)
Monocytes Absolute: 1.5 10*3/uL — ABNORMAL HIGH (ref 0.1–1.0)
Monocytes Relative: 14 %
Neutro Abs: 8.3 10*3/uL — ABNORMAL HIGH (ref 1.7–7.7)
Neutrophils Relative %: 78 %
Platelets: 281 10*3/uL (ref 150–400)
RBC: 3.67 MIL/uL — ABNORMAL LOW (ref 3.87–5.11)
RDW: 12.6 % (ref 11.5–15.5)
WBC: 10.7 10*3/uL — ABNORMAL HIGH (ref 4.0–10.5)

## 2017-05-22 LAB — BASIC METABOLIC PANEL
ANION GAP: 10 (ref 5–15)
BUN: 9 mg/dL (ref 6–20)
CO2: 24 mmol/L (ref 22–32)
Calcium: 8.8 mg/dL — ABNORMAL LOW (ref 8.9–10.3)
Chloride: 95 mmol/L — ABNORMAL LOW (ref 101–111)
Creatinine, Ser: 0.8 mg/dL (ref 0.44–1.00)
GFR calc Af Amer: >60 mL/min (ref >60)
GLUCOSE: 115 mg/dL — AB (ref 65–99)
POTASSIUM: 3.9 mmol/L (ref 3.5–5.1)
Sodium: 129 mmol/L — ABNORMAL LOW (ref 135–145)

## 2017-05-22 MED ORDER — ACETAMINOPHEN 325 MG PO TABS
650.0000 mg | ORAL_TABLET | Freq: Once | ORAL | Status: AC
  Administered 2017-05-22: 650 mg via ORAL
  Filled 2017-05-22: qty 2

## 2017-05-22 MED ORDER — LEVOFLOXACIN 500 MG PO TABS: 500.0000 mg | ORAL_TABLET | Freq: Every day | ORAL | 0 refills | Status: DC

## 2017-05-22 NOTE — ED Triage Notes (Signed)
Fever on and off for last couple of days.  Reports body aches and cough.  States temp at home was 103 earlier today

## 2017-05-22 NOTE — ED Provider Notes (Signed)
[facility] DEPT Provider Note   CSN: [id number] Arrival date & time: 05/22/17  2035     History   Chief Complaint Chief Complaint  Patient presents with  . Fever    HPI April Lang is a 76 y.o. female.  Pt has had a fever and cough for a couple days   The history is provided by the patient.  Fever   This is a new problem. The current episode started more than 2 days ago. The problem occurs constantly. The problem has not changed since onset.Her temperature was unmeasured prior to arrival. Associated symptoms include cough. Pertinent negatives include no chest pain, no diarrhea, no congestion and no headaches. She has tried nothing for the symptoms.    Past Medical History:  Diagnosis Date  . Arthritis   . Asthma   . COPD (chronic obstructive pulmonary disease) ([place])   . Cystocele 01/19/2013  . Hypertension   . Hypothyroidism   . Pre-diabetes   . Stress incontinence 01/19/2013  . Thyroid disease     Patient Active Problem List   Diagnosis Date Noted  . Carotid artery disease ([place]) 02/16/2017  . Elevated hemoglobin A1c 01/26/2017  . Well woman exam with routine gynecological exam 01/26/2017  . Anxiety 03/03/2016  . Elevated cholesterol 02/27/2015  . Dilated cbd, acquired 11/15/2013  . GERD (gastroesophageal reflux disease) 11/15/2013  . Lung nodule 10/26/2013  . Mixed incontinence urge and stress (female)(female) 02/28/2013  . Cystocele 01/19/2013  . Hypothyroid 01/19/2013  . Asthma 01/18/2013  . Essential hypertension 01/18/2013    Past Surgical History:  Procedure Laterality Date  . ABDOMINAL HYSTERECTOMY    . CATARACT EXTRACTION    . CHOLECYSTECTOMY    . DILATION AND CURETTAGE OF UTERUS    . HAND SURGERY Right    screws put in fingers  . TUBAL LIGATION    . WRIST SURGERY Left     OB History    Gravida Para Term Preterm AB Living   1 1 1     1    SAB TAB Ectopic Multiple Live Births           1       Home Medications    Prior to  Admission medications   Medication Sig Start Date End Date Taking? Authorizing Provider  acetaminophen (TYLENOL) 500 MG tablet Take 500 mg by mouth as needed for pain.   Yes [provider]  ALPRAZolam (XANAX) 0.25 MG tablet Take 0.25 mg by mouth daily as needed.  04/07/17  Yes [provider]  aspirin 81 MG chewable tablet Chew by mouth daily.   Yes [provider]  dextromethorphan-guaiFENesin (MUCINEX DM) 30-600 MG 12hr tablet Take 1 tablet by mouth daily as needed for cough.   Yes [provider]  diphenhydrAMINE (BENADRYL) 25 MG tablet Take 25-50 mg by mouth daily as needed for itching or allergies.   Yes [provider]  hydrochlorothiazide (HYDRODIURIL) 25 MG tablet TAKE ONE TABLET BY MOUTH DAILY. 05/11/17  Yes Lorretta Harp, MD  levothyroxine (SYNTHROID, LEVOTHROID) 100 MCG tablet Take 100 mcg by mouth daily before breakfast.   Yes [provider]  losartan (COZAAR) 50 MG tablet TAKE ONE TABLET BY MOUTH DAILY. 05/11/17  Yes Lorretta Harp, MD  levofloxacin (LEVAQUIN) 500 MG tablet Take 1 tablet (500 mg total) by mouth daily. 05/22/17   Milton Ferguson, MD    Family History Family History  Problem Relation Age of Onset  . Cancer Mother  breast  . Dementia Mother   . Coronary artery disease Father   . Cancer Maternal Aunt        breast  . Cancer Maternal Grandmother        breast  . Kidney disease Paternal Grandmother     Social History Social History  Substance Use Topics  . Smoking status: Never Smoker  . Smokeless tobacco: Never Used  . Alcohol use No     Allergies   Codeine and Lisinopril   Review of Systems Review of Systems  Constitutional: Positive for fever. Negative for appetite change and fatigue.  HENT: Negative for congestion, ear discharge and sinus pressure.   Eyes: Negative for discharge.  Respiratory: Positive for cough.   Cardiovascular: Negative for chest pain.  Gastrointestinal: Negative  for abdominal pain and diarrhea.  Genitourinary: Negative for frequency and hematuria.  Musculoskeletal: Negative for back pain.  Skin: Negative for rash.  Neurological: Negative for seizures and headaches.  Psychiatric/Behavioral: Negative for hallucinations.     Physical Exam Updated Vital Signs BP (!) 166/66 (BP Location: Right Arm)   Pulse 91   Temp 100 F (37.8 C) (Oral)   Resp 20   Ht 5\' 4"  (1.626 m)   Wt 69.9 kg (154 lb)   SpO2 98%   BMI 26.43 kg/m   Physical Exam  Constitutional: She is oriented to person, place, and time. She appears well-developed.  HENT:  Head: Normocephalic.  Eyes: Conjunctivae and EOM are normal. No scleral icterus.  Neck: Neck supple. No thyromegaly present.  Cardiovascular: Normal rate and regular rhythm.  Exam reveals no gallop and no friction rub.   No murmur heard. Pulmonary/Chest: No stridor. She has no wheezes. She has no rales. She exhibits no tenderness.  Abdominal: She exhibits no distension. There is no tenderness. There is no rebound.  Musculoskeletal: Normal range of motion. She exhibits no edema.  Lymphadenopathy:    She has no cervical adenopathy.  Neurological: She is oriented to person, place, and time. She exhibits normal muscle tone. Coordination normal.  Skin: No rash noted. No erythema.  Psychiatric: She has a normal mood and affect. Her behavior is normal.     ED Treatments / Results  Labs (all labs ordered are listed, but only abnormal results are displayed) Labs Reviewed  CBC WITH DIFFERENTIAL/PLATELET - Abnormal; Notable for the following:       Result Value   WBC 10.7 (*)    RBC 3.67 (*)    HCT 35.7 (*)    Neutro Abs 8.3 (*)    Monocytes Absolute 1.5 (*)    All other components within normal limits  BASIC METABOLIC PANEL - Abnormal; Notable for the following:    Sodium 129 (*)    Chloride 95 (*)    Glucose, Bld 115 (*)    Calcium 8.8 (*)    All other components within normal limits  URINALYSIS, ROUTINE  W REFLEX MICROSCOPIC - Abnormal; Notable for the following:    Color, Urine STRAW (*)    All other components within normal limits    EKG  EKG Interpretation None       Radiology Dg Chest 2 View  Result Date: 05/22/2017 CLINICAL DATA:  Fevers EXAM: CHEST  2 VIEW COMPARISON:  07/18/2013 FINDINGS: The heart size and mediastinal contours are within normal limits. Aortic atherosclerosis. Both lungs are clear. The visualized skeletal structures are unremarkable. IMPRESSION: No active cardiopulmonary disease. Aortic Atherosclerosis (ICD10-I70.0). Electronically Signed   By: Queen Slough.D.  On: 05/22/2017 21:56    Procedures Procedures (including critical care time)  Medications Ordered in ED Medications  acetaminophen (TYLENOL) tablet 650 mg (650 mg Oral Given 05/22/17 2116)     Initial Impression / Assessment and Plan / ED Course  I have reviewed the triage vital signs and the nursing notes.  Pertinent labs & imaging results that were available during my care of the patient were reviewed by me and considered in my medical decision making (see chart for details).   patient with fever and cough. Labs unremarkable remarkable. She will be covered with Levaquin and follow-up with her PCP   Final Clinical Impressions(s) / ED Diagnoses   Final diagnoses:  Febrile illness    New Prescriptions New Prescriptions   LEVOFLOXACIN (LEVAQUIN) 500 MG TABLET    Take 1 tablet (500 mg total) by mouth daily.     Milton Ferguson, MD 05/22/17 [phone]

## 2017-05-22 NOTE — Discharge Instructions (Signed)
Follow up with your md this week for recheck  °

## 2017-05-24 ENCOUNTER — Telehealth: Payer: Self-pay | Admitting: Adult Health

## 2017-05-24 NOTE — Telephone Encounter (Signed)
Patient called stating that she would like a call back from [facility] as soon as possible, pt states she has a big problem and would like to speak with Anderson Malta.

## 2017-05-24 NOTE — Telephone Encounter (Signed)
Pt has URI, on Levaquin ,try delsym and mucinex D

## 2017-05-25 DIAGNOSIS — J329 Chronic sinusitis, unspecified: Secondary | ICD-10-CM | POA: Diagnosis not present

## 2017-05-25 DIAGNOSIS — E871 Hypo-osmolality and hyponatremia: Secondary | ICD-10-CM | POA: Diagnosis not present

## 2017-05-25 DIAGNOSIS — K219 Gastro-esophageal reflux disease without esophagitis: Secondary | ICD-10-CM | POA: Diagnosis not present

## 2017-05-25 DIAGNOSIS — D72829 Elevated white blood cell count, unspecified: Secondary | ICD-10-CM | POA: Diagnosis not present

## 2017-05-25 DIAGNOSIS — I10 Essential (primary) hypertension: Secondary | ICD-10-CM | POA: Diagnosis not present

## 2017-05-25 DIAGNOSIS — J042 Acute laryngotracheitis: Secondary | ICD-10-CM | POA: Diagnosis not present

## 2017-05-25 DIAGNOSIS — Z6826 Body mass index (BMI) 26.0-26.9, adult: Secondary | ICD-10-CM | POA: Diagnosis not present

## 2017-05-25 DIAGNOSIS — E663 Overweight: Secondary | ICD-10-CM | POA: Diagnosis not present

## 2017-05-25 DIAGNOSIS — R490 Dysphonia: Secondary | ICD-10-CM | POA: Diagnosis not present

## 2017-05-26 DIAGNOSIS — E748 Other specified disorders of carbohydrate metabolism: Secondary | ICD-10-CM | POA: Diagnosis not present

## 2017-05-26 DIAGNOSIS — E871 Hypo-osmolality and hyponatremia: Secondary | ICD-10-CM | POA: Diagnosis not present

## 2017-06-09 DIAGNOSIS — Z23 Encounter for immunization: Secondary | ICD-10-CM | POA: Diagnosis not present

## 2017-06-09 DIAGNOSIS — E876 Hypokalemia: Secondary | ICD-10-CM | POA: Diagnosis not present

## 2017-07-14 DIAGNOSIS — Z0001 Encounter for general adult medical examination with abnormal findings: Secondary | ICD-10-CM | POA: Diagnosis not present

## 2017-07-14 DIAGNOSIS — Z6826 Body mass index (BMI) 26.0-26.9, adult: Secondary | ICD-10-CM | POA: Diagnosis not present

## 2017-07-14 DIAGNOSIS — E663 Overweight: Secondary | ICD-10-CM | POA: Diagnosis not present

## 2017-07-14 DIAGNOSIS — Z1389 Encounter for screening for other disorder: Secondary | ICD-10-CM | POA: Diagnosis not present

## 2017-08-19 DIAGNOSIS — M79644 Pain in right finger(s): Secondary | ICD-10-CM | POA: Diagnosis not present

## 2017-10-27 DIAGNOSIS — E663 Overweight: Secondary | ICD-10-CM | POA: Diagnosis not present

## 2017-10-27 DIAGNOSIS — G5731 Lesion of lateral popliteal nerve, right lower limb: Secondary | ICD-10-CM | POA: Diagnosis not present

## 2017-10-27 DIAGNOSIS — M542 Cervicalgia: Secondary | ICD-10-CM | POA: Diagnosis not present

## 2017-10-27 DIAGNOSIS — Z6826 Body mass index (BMI) 26.0-26.9, adult: Secondary | ICD-10-CM | POA: Diagnosis not present

## 2017-11-05 DIAGNOSIS — M5481 Occipital neuralgia: Secondary | ICD-10-CM | POA: Diagnosis not present

## 2017-11-05 DIAGNOSIS — Z79899 Other long term (current) drug therapy: Secondary | ICD-10-CM | POA: Diagnosis not present

## 2017-11-05 DIAGNOSIS — G64 Other disorders of peripheral nervous system: Secondary | ICD-10-CM | POA: Diagnosis not present

## 2017-11-05 DIAGNOSIS — E063 Autoimmune thyroiditis: Secondary | ICD-10-CM | POA: Diagnosis not present

## 2017-11-05 DIAGNOSIS — E663 Overweight: Secondary | ICD-10-CM | POA: Diagnosis not present

## 2017-11-05 DIAGNOSIS — I700 Atherosclerosis of aorta: Secondary | ICD-10-CM | POA: Diagnosis not present

## 2017-11-05 DIAGNOSIS — L209 Atopic dermatitis, unspecified: Secondary | ICD-10-CM | POA: Diagnosis not present

## 2017-11-05 DIAGNOSIS — E782 Mixed hyperlipidemia: Secondary | ICD-10-CM | POA: Diagnosis not present

## 2017-11-05 DIAGNOSIS — Z6826 Body mass index (BMI) 26.0-26.9, adult: Secondary | ICD-10-CM | POA: Diagnosis not present

## 2017-11-05 DIAGNOSIS — I10 Essential (primary) hypertension: Secondary | ICD-10-CM | POA: Diagnosis not present

## 2017-11-05 DIAGNOSIS — E785 Hyperlipidemia, unspecified: Secondary | ICD-10-CM | POA: Diagnosis not present

## 2017-12-01 ENCOUNTER — Encounter (INDEPENDENT_AMBULATORY_CARE_PROVIDER_SITE_OTHER): Payer: Self-pay | Admitting: Internal Medicine

## 2017-12-01 ENCOUNTER — Ambulatory Visit (INDEPENDENT_AMBULATORY_CARE_PROVIDER_SITE_OTHER): Payer: Medicare Other | Admitting: Internal Medicine

## 2017-12-01 VITALS — BP 170/70 | HR 68 | Temp 98.8°F | Ht 64.0 in | Wt 155.6 lb

## 2017-12-01 DIAGNOSIS — R1310 Dysphagia, unspecified: Secondary | ICD-10-CM

## 2017-12-01 DIAGNOSIS — K219 Gastro-esophageal reflux disease without esophagitis: Secondary | ICD-10-CM | POA: Diagnosis not present

## 2017-12-01 NOTE — Progress Notes (Signed)
   Subjective:    Patient ID: April Lang, female    DOB: 06/20/1941, 77 y.o.   MRN: [id number]  HPI  Here today for f/u. Last seen in March of 2018. Hx of chronic GERD and dysphagia. Underwent an esophagram in February of 2018 which revealed non specific esophageal motility disorder. Otherwise normal EGD. She tells me her swallowing is okay. Sometimes she chokes if she drinks from a straw. Appetite is good. No weight loss. BMs are okay. She denies any acid reflux.  She has been drinking Programmer, systems.      10/27/2016 DG esophagram;   IMPRESSION: 1. Nonspecific esophageal motility disorder with occasional mild tertiary contractions. 2. Otherwise, normal esophagram. esophageal ring. No hiatal hernia. Water siphon test demonstrated no gastroesophageal reflux. A barium tablet was administered, which passed readily into the stomach.   Review of Systems Past Medical History:  Diagnosis Date  . Arthritis   . Asthma   . COPD (chronic obstructive pulmonary disease) ([place])   . Cystocele 01/19/2013  . Hypertension   . Hypothyroidism   . Pre-diabetes   . Stress incontinence 01/19/2013  . Thyroid disease     Past Surgical History:  Procedure Laterality Date  . ABDOMINAL HYSTERECTOMY    . CATARACT EXTRACTION    . CHOLECYSTECTOMY    . DILATION AND CURETTAGE OF UTERUS    . HAND SURGERY Right    screws put in fingers  . TUBAL LIGATION    . WRIST SURGERY Left     Allergies  Allergen Reactions  . Codeine   . Lisinopril     Cough    Current Outpatient Medications on File Prior to Visit  Medication Sig Dispense Refill  . acetaminophen (TYLENOL) 500 MG tablet Take 500 mg by mouth as needed for pain.    Marland Kitchen aspirin 81 MG chewable tablet Chew by mouth daily.    . hydrochlorothiazide (HYDRODIURIL) 25 MG tablet TAKE ONE TABLET BY MOUTH DAILY. 90 tablet 3  . levothyroxine (SYNTHROID, LEVOTHROID) 100 MCG tablet Take 100 mcg by mouth daily before breakfast.    . pravastatin (PRAVACHOL) 10  MG tablet Take 10 mg by mouth daily.     No current facility-administered medications on file prior to visit.         Objective:   Physical Exam Blood pressure (!) 170/70, pulse 68, temperature 98.8 F (37.1 C), height 5\' 4"  (1.626 m), weight 155 lb 9.6 oz (70.6 kg). Alert and oriented. Skin warm and dry. Oral mucosa is moist.   . Sclera anicteric, conjunctivae is pink. Thyroid not enlarged. No cervical lymphadenopathy. Lungs clear. Heart regular rate and rhythm.  Abdomen is soft. Bowel sounds are positive. No hepatomegaly. No abdominal masses felt. No tenderness.  No edema to lower extremities.          Assessment & Plan:  GERD. Dysphagia.  No prolbems with swallowing.  OV in 1 year.

## 2017-12-01 NOTE — Patient Instructions (Signed)
OV in 1 year.  

## 2017-12-07 ENCOUNTER — Other Ambulatory Visit (HOSPITAL_COMMUNITY): Payer: Self-pay | Admitting: Internal Medicine

## 2017-12-07 DIAGNOSIS — Z1231 Encounter for screening mammogram for malignant neoplasm of breast: Secondary | ICD-10-CM

## 2017-12-29 DIAGNOSIS — S70361A Insect bite (nonvenomous), right thigh, initial encounter: Secondary | ICD-10-CM | POA: Diagnosis not present

## 2017-12-29 DIAGNOSIS — E663 Overweight: Secondary | ICD-10-CM | POA: Diagnosis not present

## 2017-12-29 DIAGNOSIS — Z6826 Body mass index (BMI) 26.0-26.9, adult: Secondary | ICD-10-CM | POA: Diagnosis not present

## 2017-12-29 DIAGNOSIS — Z1389 Encounter for screening for other disorder: Secondary | ICD-10-CM | POA: Diagnosis not present

## 2018-01-23 ENCOUNTER — Other Ambulatory Visit: Payer: Self-pay | Admitting: Cardiovascular Disease

## 2018-01-24 ENCOUNTER — Ambulatory Visit (HOSPITAL_COMMUNITY)
Admission: RE | Admit: 2018-01-24 | Discharge: 2018-01-24 | Disposition: A | Discharge status: Home / Self Care | Payer: Medicare Other | Source: Ambulatory Visit | Attending: Internal Medicine | Admitting: Internal Medicine

## 2018-01-24 ENCOUNTER — Encounter (HOSPITAL_COMMUNITY): Payer: Self-pay

## 2018-01-24 DIAGNOSIS — Z1231 Encounter for screening mammogram for malignant neoplasm of breast: Secondary | ICD-10-CM | POA: Diagnosis not present

## 2018-01-24 NOTE — Telephone Encounter (Signed)
Rx has been sent to the pharmacy electronically. ° °

## 2018-02-01 DIAGNOSIS — D2262 Melanocytic nevi of left upper limb, including shoulder: Secondary | ICD-10-CM | POA: Diagnosis not present

## 2018-02-01 DIAGNOSIS — L821 Other seborrheic keratosis: Secondary | ICD-10-CM | POA: Diagnosis not present

## 2018-02-01 DIAGNOSIS — D485 Neoplasm of uncertain behavior of skin: Secondary | ICD-10-CM | POA: Diagnosis not present

## 2018-02-01 DIAGNOSIS — D229 Melanocytic nevi, unspecified: Secondary | ICD-10-CM | POA: Diagnosis not present

## 2018-02-16 ENCOUNTER — Encounter: Payer: Self-pay | Admitting: Cardiovascular Disease

## 2018-02-16 ENCOUNTER — Ambulatory Visit (INDEPENDENT_AMBULATORY_CARE_PROVIDER_SITE_OTHER): Payer: Medicare Other | Admitting: Cardiovascular Disease

## 2018-02-16 DIAGNOSIS — I10 Essential (primary) hypertension: Secondary | ICD-10-CM

## 2018-02-16 DIAGNOSIS — E7800 Pure hypercholesterolemia, unspecified: Secondary | ICD-10-CM

## 2018-02-16 DIAGNOSIS — I6522 Occlusion and stenosis of left carotid artery: Secondary | ICD-10-CM | POA: Diagnosis not present

## 2018-02-16 NOTE — Patient Instructions (Signed)
Medication Instructions: Your physician recommends that you continue on your current medications as directed. Please refer to the Current Medication list given to you today.  Testing/Procedures: Your physician has requested that you have a carotid duplex. This test is an ultrasound of the carotid arteries in your neck. It looks at blood flow through these arteries that supply the brain with blood. Allow one hour for this exam. There are no restrictions or special instructions.  Follow-Up: Your physician wants you to follow-up in: 1 year with Dr. Berry. You will receive a reminder letter in the mail two months in advance. If you don't receive a letter, please call our office to schedule the follow-up appointment.  If you need a refill on your cardiac medications before your next appointment, please call your pharmacy.  

## 2018-02-16 NOTE — Assessment & Plan Note (Signed)
History of carotid artery disease Doppler performed 03/09/2017 revealing moderate left ICA stenosis.  We will repeat her carotid Doppler study.

## 2018-02-16 NOTE — Assessment & Plan Note (Signed)
History of hyperlipidemia on statin therapy. 

## 2018-02-16 NOTE — Addendum Note (Signed)
Addended by: Zebedee Iba on: 02/16/2018 12:07 PM   Modules accepted: Orders

## 2018-02-16 NOTE — Progress Notes (Signed)
02/16/2018 [name]   12-Jan-1941  [id number]  Primary Physician Redmond School, MD Primary Cardiologist: Lorretta Harp MD [facility] Reddish, [place], [place]  HPI:  April Lang is a 77 y.o. Caucasian female who I last saw in the office 02/16/2017.  She had a MET test in the past that was negative for ischemia, an echo that showed increased filling pressures. I began her on a diuretic to which she has had a variable response. She does have an ACE cough and was started on losartan instead. She denies chest pain or shortness of breath. Her primary care physician follows her lipid profile closely which was most recently performed 01/26/17. Total cholesterol 202, LDL 108 and HDL of 61.     Current Meds  Medication Sig  . acetaminophen (TYLENOL) 500 MG tablet Take 500 mg by mouth as needed for pain.  Marland Kitchen aspirin 81 MG chewable tablet Chew by mouth daily.  . hydrochlorothiazide (HYDRODIURIL) 25 MG tablet TAKE ONE TABLET BY MOUTH DAILY.  Marland Kitchen levothyroxine (SYNTHROID, LEVOTHROID) 100 MCG tablet Take 100 mcg by mouth daily before breakfast.  . losartan (COZAAR) 50 MG tablet TAKE ONE TABLET BY MOUTH DAILY.  . pravastatin (PRAVACHOL) 10 MG tablet Take 10 mg by mouth daily.     Allergies  Allergen Reactions  . Codeine   . Lisinopril     Cough    Social History   Socioeconomic History  . Marital status: Married    Spouse name: Not on file  . Number of children: Not on file  . Years of education: Not on file  . Highest education level: Not on file  Occupational History  . Not on file  Social Needs  . Financial resource strain: Not on file  . Food insecurity:    Worry: Not on file    Inability: Not on file  . Transportation needs:    Medical: Not on file    Non-medical: Not on file  Tobacco Use  . Smoking status: Never Smoker  . Smokeless tobacco: Never Used  Substance and Sexual Activity  . Alcohol use: No  . Drug use: No  . Sexual activity: Not Currently    Birth  control/protection: Surgical    Comment: hyst  Lifestyle  . Physical activity:    Days per week: Not on file    Minutes per session: Not on file  . Stress: Not on file  Relationships  . Social connections:    Talks on phone: Not on file    Gets together: Not on file    Attends religious service: Not on file    Active member of club or organization: Not on file    Attends meetings of clubs or organizations: Not on file    Relationship status: Not on file  . Intimate partner violence:    Fear of current or ex partner: Not on file    Emotionally abused: Not on file    Physically abused: Not on file    Forced sexual activity: Not on file  Other Topics Concern  . Not on file  Social History Narrative  . Not on file     Review of Systems: General: negative for chills, fever, night sweats or weight changes.  Cardiovascular: negative for chest pain, dyspnea on exertion, edema, orthopnea, palpitations, paroxysmal nocturnal dyspnea or shortness of breath Dermatological: negative for rash Respiratory: negative for cough or wheezing Urologic: negative for hematuria Abdominal: negative for nausea, vomiting, diarrhea, bright red blood  per rectum, melena, or hematemesis Neurologic: negative for visual changes, syncope, or dizziness All other systems reviewed and are otherwise negative except as noted above.    Blood pressure (!) 168/66, pulse 67, height 5' 4" (1.626 m), weight 156 lb (70.8 kg).  General appearance: alert and no distress Neck: no adenopathy, no carotid bruit, no JVD, supple, symmetrical, trachea midline and thyroid not enlarged, symmetric, no tenderness/mass/nodules Lungs: clear to auscultation bilaterally Heart: regular rate and rhythm, S1, S2 normal, no murmur, click, rub or gallop Extremities: extremities normal, atraumatic, no cyanosis or edema Pulses: 2+ and symmetric Skin: Skin color, texture, turgor normal. No rashes or lesions Neurologic: Alert and oriented X 3,  normal strength and tone. Normal symmetric reflexes. Normal coordination and gait  EKG sinus rhythm at 67 with evidence of borderline LVH by voltage criteria and septal Q waves.  I personally reviewed this EKG.  ASSESSMENT AND PLAN:   Essential hypertension History of essential hypertension her blood pressure measured at 168/66.  She is on hydrochlorothiazide and losartan.  Continue current meds at current dosing.  Elevated cholesterol History of hyperlipidemia on statin therapy  Carotid artery disease ([facility]) History of carotid artery disease Doppler performed 03/09/2017 revealing moderate left ICA stenosis.  We will repeat her carotid Doppler study.       J.  MD FACP,FACC,FAHA, FSCAI 02/16/2018 11:57 AM 

## 2018-02-16 NOTE — Assessment & Plan Note (Signed)
History of essential hypertension her blood pressure measured at 168/66.  She is on hydrochlorothiazide and losartan.  Continue current meds at current dosing.

## 2018-02-28 DIAGNOSIS — M1711 Unilateral primary osteoarthritis, right knee: Secondary | ICD-10-CM | POA: Diagnosis not present

## 2018-03-15 ENCOUNTER — Ambulatory Visit (HOSPITAL_COMMUNITY)
Admission: RE | Admit: 2018-03-15 | Discharge: 2018-03-15 | Disposition: A | Discharge status: Home / Self Care | Payer: Medicare Other | Source: Ambulatory Visit | Attending: Cardiovascular Disease | Admitting: Cardiovascular Disease

## 2018-03-15 DIAGNOSIS — I6522 Occlusion and stenosis of left carotid artery: Secondary | ICD-10-CM | POA: Insufficient documentation

## 2018-03-16 ENCOUNTER — Other Ambulatory Visit: Payer: Self-pay | Admitting: *Deleted

## 2018-03-16 DIAGNOSIS — I6522 Occlusion and stenosis of left carotid artery: Secondary | ICD-10-CM

## 2018-03-18 DIAGNOSIS — Z961 Presence of intraocular lens: Secondary | ICD-10-CM | POA: Diagnosis not present

## 2018-03-18 DIAGNOSIS — I10 Essential (primary) hypertension: Secondary | ICD-10-CM | POA: Diagnosis not present

## 2018-03-18 DIAGNOSIS — H04123 Dry eye syndrome of bilateral lacrimal glands: Secondary | ICD-10-CM | POA: Diagnosis not present

## 2018-03-18 DIAGNOSIS — E119 Type 2 diabetes mellitus without complications: Secondary | ICD-10-CM | POA: Diagnosis not present

## 2018-04-08 DIAGNOSIS — M1711 Unilateral primary osteoarthritis, right knee: Secondary | ICD-10-CM | POA: Diagnosis not present

## 2018-04-13 DIAGNOSIS — M1711 Unilateral primary osteoarthritis, right knee: Secondary | ICD-10-CM | POA: Diagnosis not present

## 2018-04-20 DIAGNOSIS — M1711 Unilateral primary osteoarthritis, right knee: Secondary | ICD-10-CM | POA: Diagnosis not present

## 2018-05-11 DIAGNOSIS — Z6826 Body mass index (BMI) 26.0-26.9, adult: Secondary | ICD-10-CM | POA: Diagnosis not present

## 2018-05-11 DIAGNOSIS — E782 Mixed hyperlipidemia: Secondary | ICD-10-CM | POA: Diagnosis not present

## 2018-05-11 DIAGNOSIS — E1165 Type 2 diabetes mellitus with hyperglycemia: Secondary | ICD-10-CM | POA: Diagnosis not present

## 2018-05-11 DIAGNOSIS — M47812 Spondylosis without myelopathy or radiculopathy, cervical region: Secondary | ICD-10-CM | POA: Diagnosis not present

## 2018-05-11 DIAGNOSIS — Z0000 Encounter for general adult medical examination without abnormal findings: Secondary | ICD-10-CM | POA: Diagnosis not present

## 2018-05-11 DIAGNOSIS — E663 Overweight: Secondary | ICD-10-CM | POA: Diagnosis not present

## 2018-05-11 DIAGNOSIS — Z23 Encounter for immunization: Secondary | ICD-10-CM | POA: Diagnosis not present

## 2018-05-24 DIAGNOSIS — I10 Essential (primary) hypertension: Secondary | ICD-10-CM | POA: Diagnosis not present

## 2018-05-24 DIAGNOSIS — Z6826 Body mass index (BMI) 26.0-26.9, adult: Secondary | ICD-10-CM | POA: Diagnosis not present

## 2018-05-24 DIAGNOSIS — M5412 Radiculopathy, cervical region: Secondary | ICD-10-CM | POA: Diagnosis not present

## 2018-06-01 DIAGNOSIS — M1711 Unilateral primary osteoarthritis, right knee: Secondary | ICD-10-CM | POA: Diagnosis not present

## 2018-07-15 DIAGNOSIS — Z1389 Encounter for screening for other disorder: Secondary | ICD-10-CM | POA: Diagnosis not present

## 2018-07-15 DIAGNOSIS — E782 Mixed hyperlipidemia: Secondary | ICD-10-CM | POA: Diagnosis not present

## 2018-07-15 DIAGNOSIS — M1991 Primary osteoarthritis, unspecified site: Secondary | ICD-10-CM | POA: Diagnosis not present

## 2018-07-15 DIAGNOSIS — Z6827 Body mass index (BMI) 27.0-27.9, adult: Secondary | ICD-10-CM | POA: Diagnosis not present

## 2018-07-15 DIAGNOSIS — M159 Polyosteoarthritis, unspecified: Secondary | ICD-10-CM | POA: Diagnosis not present

## 2018-07-15 DIAGNOSIS — I10 Essential (primary) hypertension: Secondary | ICD-10-CM | POA: Diagnosis not present

## 2018-07-15 DIAGNOSIS — E063 Autoimmune thyroiditis: Secondary | ICD-10-CM | POA: Diagnosis not present

## 2018-07-15 DIAGNOSIS — J45909 Unspecified asthma, uncomplicated: Secondary | ICD-10-CM | POA: Diagnosis not present

## 2018-07-15 DIAGNOSIS — Z0001 Encounter for general adult medical examination with abnormal findings: Secondary | ICD-10-CM | POA: Diagnosis not present

## 2018-07-16 ENCOUNTER — Other Ambulatory Visit: Payer: Self-pay | Admitting: Cardiovascular Disease

## 2018-07-18 NOTE — Telephone Encounter (Signed)
Rx has been sent to the pharmacy electronically. ° °

## 2018-07-26 ENCOUNTER — Other Ambulatory Visit (HOSPITAL_COMMUNITY): Payer: Self-pay | Admitting: Internal Medicine

## 2018-07-26 DIAGNOSIS — E2839 Other primary ovarian failure: Secondary | ICD-10-CM

## 2018-09-15 DIAGNOSIS — M5412 Radiculopathy, cervical region: Secondary | ICD-10-CM | POA: Diagnosis not present

## 2018-11-24 ENCOUNTER — Encounter (INDEPENDENT_AMBULATORY_CARE_PROVIDER_SITE_OTHER): Payer: Self-pay

## 2018-12-01 ENCOUNTER — Ambulatory Visit (INDEPENDENT_AMBULATORY_CARE_PROVIDER_SITE_OTHER): Payer: Medicare Other | Admitting: Internal Medicine

## 2018-12-27 ENCOUNTER — Encounter (INDEPENDENT_AMBULATORY_CARE_PROVIDER_SITE_OTHER): Payer: Self-pay | Admitting: Internal Medicine

## 2018-12-27 ENCOUNTER — Other Ambulatory Visit: Payer: Self-pay

## 2018-12-27 ENCOUNTER — Ambulatory Visit (INDEPENDENT_AMBULATORY_CARE_PROVIDER_SITE_OTHER): Payer: Medicare Other | Admitting: Internal Medicine

## 2018-12-27 DIAGNOSIS — K219 Gastro-esophageal reflux disease without esophagitis: Secondary | ICD-10-CM

## 2018-12-27 NOTE — Patient Instructions (Signed)
OV in 1 year.  

## 2018-12-27 NOTE — Progress Notes (Signed)
   Subjective:    Patient ID: April Lang, female    DOB: [birth year], 78 y.o.   MRN: [id number]   Started 10:40am. Ended 1051am.  11 minutes.    HPI this is a telephone OV.  Patient consented to speak with me on the phone. Patient is at home. I am in the office.  She does not have video capabilities. Telephone OV due to the risk of Covid-19. Her last colonoscopy was in 2011. Single small diverticula at proximal sigmoid colon. External hemorrhoids, otherwise normal exam.  She tells me she is doing go.  Sometimes 30 minutes after she eats, she may vomit or she will have abdominal pain. For the past 2 months, she has not had not had any pain. Her appetite is good. No weight loss. Her BMs are normal. Sometimes, her BMs are mushy. No melena or BRRB.  She mows her yard with a push weed eater.    10/27/2016 DG esophagram;  IMPRESSION: 1. Nonspecific esophageal motility disorder with occasional mild tertiary contractions. 2. Otherwise, normal esophagram. esophageal ring. No hiatal hernia. Water siphon test demonstrated no gastroesophageal reflux. A barium tablet was administered, which passed readily into the stomach.     Review of Systems Past Medical History:  Diagnosis Date  . Arthritis   . Asthma   . COPD (chronic obstructive pulmonary disease) ([place])   . Cystocele 01/19/2013  . Hypertension   . Hypothyroidism   . Pre-diabetes   . Stress incontinence 01/19/2013  . Thyroid disease     Past Surgical History:  Procedure Laterality Date  . ABDOMINAL HYSTERECTOMY    . CATARACT EXTRACTION    . CHOLECYSTECTOMY    . DILATION AND CURETTAGE OF UTERUS    . HAND SURGERY Right    screws put in fingers  . TUBAL LIGATION    . WRIST SURGERY Left     Allergies  Allergen Reactions  . Codeine     Almost passed out.   . Lisinopril     Cough  . Other     Current Outpatient Medications on File Prior to Visit  Medication Sig Dispense Refill  . acetaminophen (TYLENOL) 500 MG  tablet Take 500 mg by mouth as needed for pain.    Marland Kitchen aspirin 81 MG chewable tablet Chew by mouth daily.    . hydrochlorothiazide (HYDRODIURIL) 25 MG tablet TAKE ONE TABLET BY MOUTH DAILY. 90 tablet 2  . levothyroxine (SYNTHROID, LEVOTHROID) 100 MCG tablet Take 100 mcg by mouth daily before breakfast.    . losartan (COZAAR) 50 MG tablet TAKE ONE TABLET BY MOUTH DAILY. 90 tablet 2  . pravastatin (PRAVACHOL) 10 MG tablet Take 10 mg by mouth daily.     No current facility-administered medications on file prior to visit.         Objective:   Physical Exam Deferred           Assessment & Plan:  GERD. She is doing well.  She is no GI complaints.  OV in 1 year.

## 2019-01-08 ENCOUNTER — Other Ambulatory Visit: Payer: Self-pay | Admitting: Cardiovascular Disease

## 2019-01-08 NOTE — Telephone Encounter (Signed)
Losartan and HCTZ refilled,

## 2019-01-25 ENCOUNTER — Other Ambulatory Visit (HOSPITAL_COMMUNITY): Payer: Self-pay | Admitting: Internal Medicine

## 2019-01-25 DIAGNOSIS — Z1231 Encounter for screening mammogram for malignant neoplasm of breast: Secondary | ICD-10-CM

## 2019-02-17 ENCOUNTER — Encounter: Payer: Self-pay | Admitting: Cardiovascular Disease

## 2019-02-17 ENCOUNTER — Other Ambulatory Visit: Payer: Self-pay

## 2019-02-17 ENCOUNTER — Ambulatory Visit (INDEPENDENT_AMBULATORY_CARE_PROVIDER_SITE_OTHER): Payer: Medicare Other | Admitting: Cardiovascular Disease

## 2019-02-17 ENCOUNTER — Ambulatory Visit: Payer: Medicare Other | Admitting: Cardiovascular Disease

## 2019-02-17 ENCOUNTER — Telehealth: Payer: Self-pay | Admitting: Cardiovascular Disease

## 2019-02-17 VITALS — BP 140/72 | HR 64 | Temp 97.7°F | Ht 64.0 in | Wt 157.0 lb

## 2019-02-17 DIAGNOSIS — I6522 Occlusion and stenosis of left carotid artery: Secondary | ICD-10-CM | POA: Diagnosis not present

## 2019-02-17 DIAGNOSIS — I10 Essential (primary) hypertension: Secondary | ICD-10-CM

## 2019-02-17 DIAGNOSIS — E7800 Pure hypercholesterolemia, unspecified: Secondary | ICD-10-CM

## 2019-02-17 NOTE — Patient Instructions (Signed)

## 2019-02-17 NOTE — Telephone Encounter (Signed)
Spoke with patient who states she was supposed to have a virtual phone visit with MD today at 0800. She has not received call. Per chart review, visit type is "office visit" but appointment notes reflect virtual visit. Spoke with Chima RN and she will contact patient about completing visit

## 2019-02-17 NOTE — Assessment & Plan Note (Signed)
History of hyperlipidemia on pravastatin with recent lipid profile performed 05/11/2018 revealing total cholesterol 178, LDL of 89 and HDL 53

## 2019-02-17 NOTE — Progress Notes (Signed)
02/17/2019 April Lang   05-24-1941  [id number]  Primary Physician Redmond School, MD Primary Cardiologist: Lorretta Harp MD [facility] Reddish, [place], [place]  HPI:  April Lang is a 78 y.o.  Caucasian female who I last saw in the office 02/16/2018. She had a MET test in the past that was negative for ischemia, an echo that showed increased filling pressures. I began her on a diuretic to which she has had a variable response. She does have an ACE cough and was started on losartan instead. She denies chest pain or shortness of breath. Her primary care physician follows her lipid profile closelywhich was most recently performed 05/11/2018.  Which showed a total cholesterol 178, LDL of 89 and HDL 53.  Since I saw her a year ago she is remained stable.  She denies chest pain or shortness of breath.  Current Meds  Medication Sig  . acetaminophen (TYLENOL) 500 MG tablet Take 500 mg by mouth as needed for pain.  Marland Kitchen aspirin 81 MG chewable tablet Chew by mouth daily.  . hydrochlorothiazide (HYDRODIURIL) 25 MG tablet TAKE ONE TABLET BY MOUTH DAILY.  Marland Kitchen levothyroxine (SYNTHROID, LEVOTHROID) 100 MCG tablet Take 100 mcg by mouth daily before breakfast.  . losartan (COZAAR) 50 MG tablet TAKE ONE TABLET BY MOUTH DAILY.  . pravastatin (PRAVACHOL) 10 MG tablet Take 10 mg by mouth daily.     Allergies  Allergen Reactions  . Codeine     Almost passed out.   . Lisinopril     Cough  . Other     Social History   Socioeconomic History  . Marital status: Married    Spouse name: Not on file  . Number of children: Not on file  . Years of education: Not on file  . Highest education level: Not on file  Occupational History  . Not on file  Social Needs  . Financial resource strain: Not on file  . Food insecurity    Worry: Not on file    Inability: Not on file  . Transportation needs    Medical: Not on file    Non-medical: Not on file  Tobacco Use  . Smoking status: Never Smoker  . Smokeless  tobacco: Never Used  Substance and Sexual Activity  . Alcohol use: No  . Drug use: No  . Sexual activity: Not Currently    Birth control/protection: Surgical    Comment: hyst  Lifestyle  . Physical activity    Days per week: Not on file    Minutes per session: Not on file  . Stress: Not on file  Relationships  . Social Herbalist on phone: Not on file    Gets together: Not on file    Attends religious service: Not on file    Active member of club or organization: Not on file    Attends meetings of clubs or organizations: Not on file    Relationship status: Not on file  . Intimate partner violence    Fear of current or ex partner: Not on file    Emotionally abused: Not on file    Physically abused: Not on file    Forced sexual activity: Not on file  Other Topics Concern  . Not on file  Social History Narrative  . Not on file     Review of Systems: General: negative for chills, fever, night sweats or weight changes.  Cardiovascular: negative for chest pain, dyspnea on exertion, edema,  orthopnea, palpitations, paroxysmal nocturnal dyspnea or shortness of breath Dermatological: negative for rash Respiratory: negative for cough or wheezing Urologic: negative for hematuria Abdominal: negative for nausea, vomiting, diarrhea, bright red blood per rectum, melena, or hematemesis Neurologic: negative for visual changes, syncope, or dizziness All other systems reviewed and are otherwise negative except as noted above.    Blood pressure 140/72, pulse 64, temperature 97.7 F (36.5 C), height _0  (1.626 m), weight 157 lb (71.2 kg).  General appearance: alert and no distress Neck: no adenopathy, no carotid bruit, no JVD, supple, symmetrical, trachea midline and thyroid not enlarged, symmetric, no tenderness/mass/nodules Lungs: clear to auscultation bilaterally Heart: regular rate and rhythm, S1, S2 normal, no murmur, click, rub or gallop Extremities: extremities normal,  atraumatic, no cyanosis or edema Pulses: 2+ and symmetric Skin: Skin color, texture, turgor normal. No rashes or lesions Neurologic: Alert and oriented X 3, normal strength and tone. Normal symmetric reflexes. 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  I Personally reviewed this EKG.  ASSESSMENT AND PLAN:   Essential hypertension History of essential hypertension with blood pressure measured today 140/72.  She is on losartan and hydrochlorothiazide  Elevated cholesterol History of hyperlipidemia on pravastatin with recent lipid profile performed 05/11/2018 revealing total cholesterol 178, LDL of 89 and HDL 53  Carotid artery disease ([place]) History of moderate left ICA stenosis scheduled to be repeated next month      Lorretta Harp MD [facility], [facility] 02/17/2019 4:52 PM

## 2019-02-17 NOTE — Telephone Encounter (Signed)
New message:   Patient calling concerning her appt this morning. Please call patient.

## 2019-02-17 NOTE — Assessment & Plan Note (Signed)
History of essential hypertension with blood pressure measured today 140/72.  She is on losartan and hydrochlorothiazide

## 2019-02-17 NOTE — Assessment & Plan Note (Signed)
History of moderate left ICA stenosis scheduled to be repeated next month

## 2019-02-19 NOTE — Telephone Encounter (Signed)
Pt presented for 1600 OV with Dr. [name]

## 2019-02-20 ENCOUNTER — Encounter (HOSPITAL_COMMUNITY): Payer: Self-pay

## 2019-02-20 ENCOUNTER — Other Ambulatory Visit: Payer: Self-pay

## 2019-02-20 ENCOUNTER — Ambulatory Visit (HOSPITAL_COMMUNITY)
Admission: RE | Admit: 2019-02-20 | Discharge: 2019-02-20 | Disposition: A | Discharge status: Home / Self Care | Payer: Medicare Other | Source: Ambulatory Visit | Attending: Internal Medicine | Admitting: Internal Medicine

## 2019-02-20 DIAGNOSIS — Z1231 Encounter for screening mammogram for malignant neoplasm of breast: Secondary | ICD-10-CM

## 2019-02-28 ENCOUNTER — Other Ambulatory Visit (HOSPITAL_COMMUNITY): Payer: Self-pay | Admitting: Internal Medicine

## 2019-02-28 DIAGNOSIS — R928 Other abnormal and inconclusive findings on diagnostic imaging of breast: Secondary | ICD-10-CM

## 2019-03-07 ENCOUNTER — Other Ambulatory Visit: Payer: Self-pay

## 2019-03-07 ENCOUNTER — Ambulatory Visit (HOSPITAL_COMMUNITY): Admission: RE | Admit: 2019-03-07 | Payer: Medicare Other | Source: Ambulatory Visit

## 2019-03-07 ENCOUNTER — Ambulatory Visit (HOSPITAL_COMMUNITY)
Admission: RE | Admit: 2019-03-07 | Discharge: 2019-03-07 | Disposition: A | Discharge status: Home / Self Care | Payer: Medicare Other | Source: Ambulatory Visit | Attending: Internal Medicine | Admitting: Internal Medicine

## 2019-03-07 DIAGNOSIS — R928 Other abnormal and inconclusive findings on diagnostic imaging of breast: Secondary | ICD-10-CM | POA: Diagnosis not present

## 2019-03-07 DIAGNOSIS — R922 Inconclusive mammogram: Secondary | ICD-10-CM | POA: Diagnosis not present

## 2019-03-20 ENCOUNTER — Other Ambulatory Visit: Payer: Self-pay

## 2019-03-20 ENCOUNTER — Other Ambulatory Visit (HOSPITAL_COMMUNITY): Payer: Self-pay | Admitting: Cardiovascular Disease

## 2019-03-20 ENCOUNTER — Ambulatory Visit (HOSPITAL_COMMUNITY)
Admission: RE | Admit: 2019-03-20 | Discharge: 2019-03-20 | Disposition: A | Discharge status: Home / Self Care | Payer: Medicare Other | Source: Ambulatory Visit | Attending: Cardiology | Admitting: Cardiology

## 2019-03-20 DIAGNOSIS — I6522 Occlusion and stenosis of left carotid artery: Secondary | ICD-10-CM

## 2019-03-21 DIAGNOSIS — H18413 Arcus senilis, bilateral: Secondary | ICD-10-CM | POA: Diagnosis not present

## 2019-03-21 DIAGNOSIS — H04123 Dry eye syndrome of bilateral lacrimal glands: Secondary | ICD-10-CM | POA: Diagnosis not present

## 2019-03-21 DIAGNOSIS — Z961 Presence of intraocular lens: Secondary | ICD-10-CM | POA: Diagnosis not present

## 2019-03-21 DIAGNOSIS — E119 Type 2 diabetes mellitus without complications: Secondary | ICD-10-CM | POA: Diagnosis not present

## 2019-05-03 DIAGNOSIS — R05 Cough: Secondary | ICD-10-CM | POA: Diagnosis not present

## 2019-05-03 DIAGNOSIS — M75101 Unspecified rotator cuff tear or rupture of right shoulder, not specified as traumatic: Secondary | ICD-10-CM | POA: Diagnosis not present

## 2019-05-03 DIAGNOSIS — I10 Essential (primary) hypertension: Secondary | ICD-10-CM | POA: Diagnosis not present

## 2019-05-03 DIAGNOSIS — M1991 Primary osteoarthritis, unspecified site: Secondary | ICD-10-CM | POA: Diagnosis not present

## 2019-05-03 DIAGNOSIS — Z6825 Body mass index (BMI) 25.0-25.9, adult: Secondary | ICD-10-CM | POA: Diagnosis not present

## 2019-05-04 DIAGNOSIS — Z79899 Other long term (current) drug therapy: Secondary | ICD-10-CM | POA: Diagnosis not present

## 2019-05-04 DIAGNOSIS — Z1389 Encounter for screening for other disorder: Secondary | ICD-10-CM | POA: Diagnosis not present

## 2019-05-08 DIAGNOSIS — E875 Hyperkalemia: Secondary | ICD-10-CM | POA: Diagnosis not present

## 2019-05-17 DIAGNOSIS — Z23 Encounter for immunization: Secondary | ICD-10-CM | POA: Diagnosis not present

## 2019-06-06 DIAGNOSIS — R7309 Other abnormal glucose: Secondary | ICD-10-CM | POA: Diagnosis not present

## 2019-06-06 DIAGNOSIS — Z6825 Body mass index (BMI) 25.0-25.9, adult: Secondary | ICD-10-CM | POA: Diagnosis not present

## 2019-06-06 DIAGNOSIS — M1991 Primary osteoarthritis, unspecified site: Secondary | ICD-10-CM | POA: Diagnosis not present

## 2019-06-06 DIAGNOSIS — E663 Overweight: Secondary | ICD-10-CM | POA: Diagnosis not present

## 2019-06-06 DIAGNOSIS — M25511 Pain in right shoulder: Secondary | ICD-10-CM | POA: Diagnosis not present

## 2019-06-16 ENCOUNTER — Other Ambulatory Visit: Payer: Self-pay

## 2019-06-20 ENCOUNTER — Other Ambulatory Visit: Payer: Self-pay

## 2019-06-20 ENCOUNTER — Encounter: Payer: Self-pay | Admitting: Internal Medicine

## 2019-06-20 ENCOUNTER — Ambulatory Visit (INDEPENDENT_AMBULATORY_CARE_PROVIDER_SITE_OTHER): Payer: Medicare Other | Admitting: Internal Medicine

## 2019-06-20 VITALS — BP 150/80 | HR 76 | Ht 64.0 in | Wt 146.0 lb

## 2019-06-20 DIAGNOSIS — R7303 Prediabetes: Secondary | ICD-10-CM

## 2019-06-20 DIAGNOSIS — R7989 Other specified abnormal findings of blood chemistry: Secondary | ICD-10-CM

## 2019-06-20 DIAGNOSIS — I6522 Occlusion and stenosis of left carotid artery: Secondary | ICD-10-CM

## 2019-06-20 DIAGNOSIS — R7309 Other abnormal glucose: Secondary | ICD-10-CM | POA: Diagnosis not present

## 2019-06-20 DIAGNOSIS — E2740 Unspecified adrenocortical insufficiency: Secondary | ICD-10-CM

## 2019-06-20 LAB — HEMOGLOBIN A1C: Hgb A1c MFr Bld: 6.1 % (ref 4.6–6.5)

## 2019-06-20 LAB — CORTISOL: Cortisol, Plasma: 1.2 ug/dL

## 2019-06-20 MED ORDER — DEXAMETHASONE 1 MG PO TABS: ORAL_TABLET | ORAL | 0 refills | Status: DC

## 2019-06-20 NOTE — Patient Instructions (Addendum)
Please take the Dexamethasone table at 10-11 pm the night before coming for labs. Please try to come between 8-9 am, fasting, skipping morning labs.  We will schedule another appt if the labs are abnormal.

## 2019-06-20 NOTE — Progress Notes (Signed)
Patient ID: April Lang, female   DOB: 1941/08/03, 78 y.o.   MRN: [id number]    HPI  April Lang is a 78 y.o.-year-old female, referred by Dr. Hilma Favors, for evaluation and management of elevated cortisol levels.  Patient had an annual physical exam recently, and she was called to come back for a.m. and p.m. labs.  These records were sent to me and it appears that she has slightly elevated cortisol levels as per records from 05/08/2019.  I do not have the previous labs that prompted the cortisol investigation.  Also, patient was not aware of the initial abnormal value so she cannot clarify.  Patient mentioned that she was under a tremendous amount of stress around that time, with her husband being sick (diabetes, CKD), and also due to an disagreement with Dr. [name] in 2017, which led her to have increased anxiety and be very stressed at the time of her appointments.  She has not seen Dr. Hilma Favors, with which she feels much more comfortable, and she tells me that she finally cannot sleep at night after she started seeing him.  05/08/2019: Cortisol 21.6 (6.2-19.4)-this was reported this a.m. cortisol  Cortisol 12.3 (2.8-11.9) -this was reported this p.m. cortisol   Aldosterone 24.5 (0-30), potassium 5.7 (3.5-5.2)  Other labs also reviewed -05/08/2019: Albumin 4.8 (3.7-4.7), with A/G ratio 2.4 (1.2-2.2), total protein 6.8 (6-8.5)  BUN/creatinine 16/1.12, GFR 47  MCV 102 (79-97)  Patient describes knee problems. She had a steroid inj 06/06/2019 with pain relief.  She had previous gel injections, that worked well.  She has a h/o osteopenia at the femoral neck per review of her DXA scan report from 2017.  Pt. also has a history of prediabetes. Latest HbA1c available for review: 05/2017: HbA1c 5.8% Lab Results  Component Value Date   HGBA1C 5.7 (H) 01/26/2017   She has a history of overweight but she adjusted her diet and was able to lose 11 lbs in last 4 mo.  She does all the work around the  house >> no fatigue, no weakness.  She was previously going to [facility] consistently.  Now gyms closed due to the coronavirus pandemic.  She denies skin changes, specifically, no stretch marks, nonhealing scars, ecchymoses.  She also has a history of hypothyroidism (latest TSH normal 1.89 on 05/04/2019), hyperlipidemia (on pravastatin 10), HTN.  ROS: Constitutional: + Weight loss, no fatigue, + subjective hyperthermia, poor sleep (improved) Eyes: no blurry vision, no xerophthalmia ENT: no sore throat, no nodules palpated in throat, + dysphagia/no odynophagia, no hoarseness Cardiovascular: no CP/SOB/palpitations/leg swelling Respiratory: + Cough/ no SOB Gastrointestinal: no N/V/D/C Musculoskeletal: no muscle/+ joint aches Skin: no rashes, + itching Neurological: no tremors/numbness/tingling/dizziness Psychiatric: no depression/+ improved anxiety  Past Medical History:  Diagnosis Date  . Arthritis   . Asthma   . COPD (chronic obstructive pulmonary disease) ([place])   . Cystocele 01/19/2013  . Hypertension   . Hypothyroidism   . Pre-diabetes   . Stress incontinence 01/19/2013  . Thyroid disease    Past Surgical History:  Procedure Laterality Date  . ABDOMINAL HYSTERECTOMY    . CATARACT EXTRACTION    . CHOLECYSTECTOMY    . DILATION AND CURETTAGE OF UTERUS    . HAND SURGERY Right    screws put in fingers  . TUBAL LIGATION    . WRIST SURGERY Left    Social History   Socioeconomic History  . Marital status: Married    Spouse name: Not on file  . Number  of children: 1  . Years of education: Not on file  . Highest education level: Not on file  Occupational History  .  Retired  Scientific laboratory technician  . Financial resource strain: Not on file  . Food insecurity    Worry: Not on file    Inability: Not on file  . Transportation needs    Medical: Not on file    Non-medical: Not on file  Tobacco Use  . Smoking status: Never Smoker  . Smokeless tobacco: Never Used  Substance and Sexual  Activity  . Alcohol use: No, never  . Drug use: No   Current Outpatient Medications on File Prior to Visit  Medication Sig Dispense Refill  . acetaminophen (TYLENOL) 500 MG tablet Take 500 mg by mouth as needed for pain.    Marland Kitchen aspirin 81 MG chewable tablet Chew by mouth daily.    . hydrochlorothiazide (HYDRODIURIL) 25 MG tablet TAKE ONE TABLET BY MOUTH DAILY. 90 tablet 0  . levothyroxine (SYNTHROID, LEVOTHROID) 100 MCG tablet Take 100 mcg by mouth daily before breakfast.    . losartan (COZAAR) 50 MG tablet TAKE ONE TABLET BY MOUTH DAILY. 90 tablet 0  . pravastatin (PRAVACHOL) 10 MG tablet Take 10 mg by mouth daily.     No current facility-administered medications on file prior to visit.    Allergies  Allergen Reactions  . Codeine     Almost passed out.   . Lisinopril     Cough  . Other    Family History  Problem Relation Age of Onset  . Cancer Mother        breast  . Dementia Mother   . Coronary artery disease Father   . Cancer Maternal Aunt        breast  . Cancer Maternal Grandmother        breast  . Kidney disease Paternal Grandmother      PE: BP (!) 150/80   Pulse 76   Ht 5\' 4"  (1.626 m)   Wt 146 lb (66.2 kg)   SpO2 99%   BMI 25.06 kg/m  Wt Readings from Last 3 Encounters:  06/20/19 146 lb (66.2 kg)  02/17/19 157 lb (71.2 kg)  02/16/18 156 lb (70.8 kg)   Constitutional: normal weight, in NAD, + mild full left supraclavicular fat pad, no moon facies Eyes: PERRLA, EOMI, no exophthalmos ENT: moist mucous membranes, no thyromegaly, no cervical lymphadenopathy Cardiovascular: RRR, No MRG Respiratory: CTA B Gastrointestinal: abdomen soft, NT, ND, BS+ Musculoskeletal: no deformities, strength intact in all 4 Skin: moist, warm, no rashes, no wide, purple, striae on abdomen/arms/thighs, no hirsutism Neurological: no tremor with outstretched hands, DTR normal in all 4  ASSESSMENT: 1. High cortisol levels  2.  Prediabetes  PLAN:  1.  High cortisol levels -At  this visit, from 04/2019.  I explained that these were mildly elevated.  She had them checked in the a.m. and in the p.m.  And they were both slightly elevated.  In the same blood sample, she also had an albumin check and this was mildly elevated, also.  We discussed that this may mean that the cortisol levels were falsely elevated due to the increase in blood proteins.  I reviewed previous CMP panels brought by patient and she did not have high albumin levels in the past. -We discussed that having high cortisol levels can be related to increased stress, especially if her circadian rhythm is maintained.  Her a.m. levels were almost twice per p.m. levels,  which is expected in the setting of non-tumoral cortisol excess.  She was under significant stress reportedly due to many reasons, which has come down recently.  Changing her PCP contributed to this significantly. -Other possible reasons for increased cortisol can be: Depression (she denies), excessive alcohol use (she never drinks) - pseudocushing sd. -Also, increased cortisol can be seen in adrenal or pituitary tumor.  We discussed that we will need to rule these out and we can do so by checking the dexamethasone suppression test first.  If this is abnormal, we may need a 24-hour urine free cortisol.  For now, today, I would like to check an ACTH and a cortisol level.  We discussed about how to perform the dexamethasone suppression test.  She agrees with the plan. -I will contact her after the results are back and have her come back for a.m. cortisol and dexamethasone level -We discussed about possible consequences of increased cortisol and as of now, she denies weight gain, fatigue, weakness.  She does have osteopenia, which is longstanding, a history of prediabetes in 2018 (we will recheck an A1c today), hypertension.   -If she turns out to have nonsuppressed high cortisol production, the next step would be adrenal or pituitary imaging -Surgery would be  indicated if her hypercortisolism is significant, otherwise, we could continue to just follow her, due to age and lack of symptoms -I will see her back if the results are abnormal  2.  Prediabetes -Patient's HbA1c was very slightly elevated in 2018 and she did not have a repeat since then.  States she is worried about this diagnosis, will check an HbA1c level today.  Office Visit on 06/20/2019  Component Date Value Ref Range Status  . [id number] ACTH 06/20/2019 <5* 6 - 50 pg/mL Final   Comment: Reference range applies only to specimens collected between 7am-10am   . Cortisol, Plasma 06/20/2019 1.2  ug/dL Final   AM:  4.3 - 22.4 ug/dLPM:  3.1 - 16.7 ug/dL  . Hgb A1c MFr Bld 06/20/2019 6.1  4.6 - 6.5 % Final   Glycemic Control Guidelines for People with Diabetes:Non Diabetic:  <6%Goal of Therapy: <7%Additional Action Suggested:  >8%    HbA1c is slightly higher than before.  She continues to adjust her diet and lost weight recently.  Would encourage her to continue this. Her ACTH and cortisol levels are low, and I suspect that this is due to her recent steroid injection.  This means that she has exogenous steroid in her system which is enough to suppress her on cortisol production.  In this situation, I do not feel that a dexamethasone suppression test is still needed.  I will let the patient know that we can cancel it.  I would like the patient to return for regular cortisol test after her steroid is out of her system, probably in approximately 3 months.  Philemon Kingdom, MD PhD [facility] Endocrinology

## 2019-06-23 LAB — ACTH: C206 ACTH: <5 pg/mL — ABNORMAL LOW (ref 6–50)

## 2019-07-17 ENCOUNTER — Other Ambulatory Visit: Payer: Self-pay | Admitting: Cardiovascular Disease

## 2019-07-19 DIAGNOSIS — M79641 Pain in right hand: Secondary | ICD-10-CM | POA: Diagnosis not present

## 2019-07-21 DIAGNOSIS — Z1389 Encounter for screening for other disorder: Secondary | ICD-10-CM | POA: Diagnosis not present

## 2019-07-21 DIAGNOSIS — Z01419 Encounter for gynecological examination (general) (routine) without abnormal findings: Secondary | ICD-10-CM | POA: Diagnosis not present

## 2019-07-21 DIAGNOSIS — I10 Essential (primary) hypertension: Secondary | ICD-10-CM | POA: Diagnosis not present

## 2019-07-21 DIAGNOSIS — Z6825 Body mass index (BMI) 25.0-25.9, adult: Secondary | ICD-10-CM | POA: Diagnosis not present

## 2019-07-21 DIAGNOSIS — J45909 Unspecified asthma, uncomplicated: Secondary | ICD-10-CM | POA: Diagnosis not present

## 2019-07-21 DIAGNOSIS — M159 Polyosteoarthritis, unspecified: Secondary | ICD-10-CM | POA: Diagnosis not present

## 2019-07-21 DIAGNOSIS — R7309 Other abnormal glucose: Secondary | ICD-10-CM | POA: Diagnosis not present

## 2019-08-02 ENCOUNTER — Other Ambulatory Visit: Payer: Self-pay

## 2019-09-22 DIAGNOSIS — Z23 Encounter for immunization: Secondary | ICD-10-CM | POA: Diagnosis not present

## 2019-10-20 DIAGNOSIS — Z23 Encounter for immunization: Secondary | ICD-10-CM | POA: Diagnosis not present

## 2019-11-06 DIAGNOSIS — E7849 Other hyperlipidemia: Secondary | ICD-10-CM | POA: Diagnosis not present

## 2019-11-06 DIAGNOSIS — Z6825 Body mass index (BMI) 25.0-25.9, adult: Secondary | ICD-10-CM | POA: Diagnosis not present

## 2019-11-06 DIAGNOSIS — E039 Hypothyroidism, unspecified: Secondary | ICD-10-CM | POA: Diagnosis not present

## 2019-11-06 DIAGNOSIS — M159 Polyosteoarthritis, unspecified: Secondary | ICD-10-CM | POA: Diagnosis not present

## 2019-11-06 DIAGNOSIS — I10 Essential (primary) hypertension: Secondary | ICD-10-CM | POA: Diagnosis not present

## 2019-11-06 DIAGNOSIS — R7309 Other abnormal glucose: Secondary | ICD-10-CM | POA: Diagnosis not present

## 2019-12-26 ENCOUNTER — Encounter (INDEPENDENT_AMBULATORY_CARE_PROVIDER_SITE_OTHER): Payer: Self-pay | Admitting: Internal Medicine

## 2019-12-26 ENCOUNTER — Ambulatory Visit (INDEPENDENT_AMBULATORY_CARE_PROVIDER_SITE_OTHER): Payer: Medicare Other | Admitting: Internal Medicine

## 2019-12-26 ENCOUNTER — Encounter (INDEPENDENT_AMBULATORY_CARE_PROVIDER_SITE_OTHER): Payer: Self-pay | Admitting: *Deleted

## 2019-12-26 ENCOUNTER — Other Ambulatory Visit: Payer: Self-pay

## 2019-12-26 DIAGNOSIS — R1310 Dysphagia, unspecified: Secondary | ICD-10-CM | POA: Diagnosis not present

## 2019-12-26 DIAGNOSIS — R1319 Other dysphagia: Secondary | ICD-10-CM | POA: Insufficient documentation

## 2019-12-26 DIAGNOSIS — Z8719 Personal history of other diseases of the digestive system: Secondary | ICD-10-CM | POA: Insufficient documentation

## 2019-12-26 NOTE — Progress Notes (Signed)
Presenting complaint;  History of GERD.  Patient complains of swallowing difficulty.  Database and subjective:  Patient is 79 year old Caucasian female who has history of GERD and previously has been on PPI who lately has been controlling her heartburn with dietary measures.  She was last seen 1 year ago. She is here for scheduled visit.  She says she is doing well as well as heartburn is concerned.  It occurs occasionally with certain foods. She says she had an episode of nausea vomiting and nonbloody diarrhea over a week ago with spontaneous recovery after few days.  She did not have fever or bleeding then.  Her bowels are now back to normal moving once a day.  She says her appetite is good and she has not lost any weight.  Her new complaint is 1 of dysphagia both to liquids and solids.  She recently had fish pill got stuck in her upper esophagus/throat and her husband gave her Heimlich maneuver with relief.  She is also having this difficulty with other foods and liquids.  Current Medications: Outpatient Encounter Medications as of 12/26/2019  Medication Sig  . acetaminophen (TYLENOL) 500 MG tablet Take 500 mg by mouth as needed for pain.  Marland Kitchen aspirin 81 MG chewable tablet Chew by mouth daily.  . clotrimazole-betamethasone (LOTRISONE) cream APPLY TOOAFFECTED/SURROUNDING AREAS TWICE DAILY IN THE MORNING AND EVENING.  . hydrochlorothiazide (HYDRODIURIL) 25 MG tablet TAKE ONE TABLET BY MOUTH DAILY.  Marland Kitchen levothyroxine (SYNTHROID, LEVOTHROID) 100 MCG tablet Take 100 mcg by mouth daily before breakfast.  . losartan (COZAAR) 50 MG tablet TAKE ONE TABLET BY MOUTH DAILY.  Marland Kitchen nystatin (NYSTATIN) powder Apply 1 application topically 3 (three) times daily.  . pravastatin (PRAVACHOL) 10 MG tablet Take 10 mg by mouth daily.  Marland Kitchen dexamethasone (DECADRON) 1 MG tablet Take 1 tablet by mouth once at 11 pm, before coming for labs at 8 am the next morning (Patient not taking: Reported on 12/26/2019)  . [DISCONTINUED]  clotrimazole (LOTRIMIN) 1 % external solution Apply 1 application topically as needed.   No facility-administered encounter medications on file as of 12/26/2019.     Objective: Blood pressure (!) 167/73, pulse 62, temperature (!) 96.6 F (35.9 C), temperature source Temporal, height 5\' 4"  (1.626 m), weight 152 lb (68.9 kg). Patient is alert and in no acute distress. Conjunctiva is pink. Sclera is nonicteric Oropharyngeal mucosa is normal. No neck masses or thyromegaly noted. Cardiac exam with regular rhythm normal S1 and S2. No murmur or gallop noted. Lungs are clear to auscultation. Abdomen is symmetrical soft and nontender with no organomegaly or masses. No LE edema or clubbing noted.    Assessment:  #1.  New onset of dysphagia to solids and liquids in a patient with history of GERD who is presently on dietary measures alone.  She does not have any alarm symptoms such as weight loss or melena.  She may have esophageal ring, web or a motility disorder.  #2.  GERD.  She is presently on dietary measures.  Plan:  Barium pill esophagogram. Further recommendations to follow. Office visit on as-needed basis.

## 2019-12-26 NOTE — Patient Instructions (Signed)
Physician will call mid results of barium study when completed.

## 2020-01-01 ENCOUNTER — Other Ambulatory Visit: Payer: Self-pay

## 2020-01-01 ENCOUNTER — Ambulatory Visit (HOSPITAL_COMMUNITY)
Admission: RE | Admit: 2020-01-01 | Discharge: 2020-01-01 | Disposition: A | Discharge status: Home / Self Care | Payer: Medicare Other | Source: Ambulatory Visit | Attending: Internal Medicine | Admitting: Internal Medicine

## 2020-01-01 DIAGNOSIS — R1310 Dysphagia, unspecified: Secondary | ICD-10-CM | POA: Diagnosis not present

## 2020-01-01 DIAGNOSIS — K449 Diaphragmatic hernia without obstruction or gangrene: Secondary | ICD-10-CM | POA: Diagnosis not present

## 2020-01-03 ENCOUNTER — Ambulatory Visit (INDEPENDENT_AMBULATORY_CARE_PROVIDER_SITE_OTHER): Payer: Medicare Other | Admitting: Adult Health

## 2020-01-03 ENCOUNTER — Other Ambulatory Visit: Payer: Self-pay

## 2020-01-03 ENCOUNTER — Encounter: Payer: Self-pay | Admitting: Adult Health

## 2020-01-03 VITALS — BP 156/63 | HR 57 | Ht 64.0 in | Wt 154.0 lb

## 2020-01-03 DIAGNOSIS — B369 Superficial mycosis, unspecified: Secondary | ICD-10-CM

## 2020-01-03 MED ORDER — FLUCONAZOLE 100 MG PO TABS: ORAL_TABLET | ORAL | 0 refills | Status: DC

## 2020-01-03 NOTE — Progress Notes (Signed)
  Subjective:     Patient ID: April Lang, female   DOB: 06/27/1941, 79 y.o.   MRN: [id number]  HPI Labria is a 79 year old white female,maried, sp hysterectomy, in complaining of vulva and groin irritation with redness and swelling with itching since mid February, she got second COVID vaccine 2/12 and then lost power and water for 3 days and was wearing leggings and did not bath.She was Dr Hilma Favors 3/1 and was prescribed, nystatin powder and Lotrisone and is better. PCP is Dr Hilma Favors.    Review of Systems Has vulva/groin irritation, with redness swelling and itching since mid February. She was treated with nystatin powders and  Lotrisone by PCP 3/1 and is a better.  Reviewed past medical,surgical, social and family history. Reviewed medications and allergies.     Objective:   Physical Exam BP (!) 156/63 (BP Location: Left Arm, Patient Position: Sitting, Cuff Size: Normal)   Pulse (!) 57   Ht 5\' 4"  (1.626 m)   Wt 154 lb (69.9 kg)   BMI 26.43 kg/m  Skin warm and dry.Pelvic: external genitalia is red and swollen, has scalling, vagina: atrophic,urethra has no lesions or masses noted, cervix and uterus are absent, adnexa: no masses or tenderness noted. Bladder is non tender and no masses felt. Painted vulva, vagina and to rectal area with gentian violet    Examination chaperoned by Celene Squibb LPN  Assessment:     1. Superficial fungus infection of skin Painted with gentian violet and will rx diflucan Meds ordered this encounter  Medications  . fluconazole (DIFLUCAN) 100 MG tablet    Sig: Take 1 daily for 10 days    Dispense:  10 tablet    Refill:  0    Order Specific Question:   Supervising Provider    Answer:   Florian Buff [[id number]]      Plan:    Use water, no soap and pat dry  Follow up 01/16/20 with me

## 2020-01-16 ENCOUNTER — Encounter: Payer: Self-pay | Admitting: Adult Health

## 2020-01-16 ENCOUNTER — Ambulatory Visit (INDEPENDENT_AMBULATORY_CARE_PROVIDER_SITE_OTHER): Payer: Medicare Other | Admitting: Adult Health

## 2020-01-16 VITALS — BP 150/68 | HR 63 | Ht 64.0 in | Wt 152.0 lb

## 2020-01-16 DIAGNOSIS — B372 Candidiasis of skin and nail: Secondary | ICD-10-CM | POA: Insufficient documentation

## 2020-01-16 DIAGNOSIS — L80 Vitiligo: Secondary | ICD-10-CM

## 2020-01-16 NOTE — Progress Notes (Signed)
  Subjective:     Patient ID: April Lang, female   DOB: 09-23-1940, 79 y.o.   MRN: [id number]  HPI [name] is a 79 year old white female,maaried, back in for recheck after taking diflucan for 10 days and painted with gentian violet and feels better,but not 100 % yet. PCP is Dr Hilma Favors.    Review of Systems Feels much better,but not 100% yet,still some itching Reviewed past medical,surgical, social and family history. Reviewed medications and allergies.     Objective:   Physical Exam BP (!) 150/68 (BP Location: Left Arm, Patient Position: Sitting, Cuff Size: Normal)   Pulse 63   Ht 5\' 4"  (1.626 m)   Wt 152 lb (68.9 kg)   BMI 26.09 kg/m    Skin warm and dry.Pelvic: external genitalia is less red and swollen, has some scaling, has some vitiligo noted, vagina: has redness at introitus and thin tissue,painted with gentian violet. Examination chaperoned by Levy Pupa LPN  Assessment:     1. Skin yeast infection Painted with gentian violet Pat dry  2. Vitiligo     Plan:     Recheck in 2 weeks

## 2020-01-22 ENCOUNTER — Other Ambulatory Visit (HOSPITAL_COMMUNITY): Payer: Self-pay | Admitting: Family Medicine

## 2020-01-22 DIAGNOSIS — Z1231 Encounter for screening mammogram for malignant neoplasm of breast: Secondary | ICD-10-CM

## 2020-01-30 ENCOUNTER — Encounter: Payer: Self-pay | Admitting: Adult Health

## 2020-01-30 ENCOUNTER — Ambulatory Visit (INDEPENDENT_AMBULATORY_CARE_PROVIDER_SITE_OTHER): Payer: Medicare Other | Admitting: Adult Health

## 2020-01-30 VITALS — BP 151/62 | HR 61 | Ht 64.0 in | Wt 152.8 lb

## 2020-01-30 DIAGNOSIS — L900 Lichen sclerosus et atrophicus: Secondary | ICD-10-CM

## 2020-01-30 MED ORDER — CLOBETASOL PROP EMOLLIENT BASE 0.05 % EX CREA: TOPICAL_CREAM | CUTANEOUS | 1 refills | Status: DC

## 2020-01-30 NOTE — Progress Notes (Signed)
  Subjective:     Patient ID: April Lang, female   DOB: 09-25-1940, 79 y.o.   MRN: [id number]  HPI April Lang is an 79 year old white female, married, back in follow up on skin fungus and is much better. PCP is Dr Hilma Favors.   Review of Systems Feels much better Occasional itching  Reviewed past medical,surgical, social and family history. Reviewed medications and allergies.     Objective:   Physical Exam BP (!) 151/62 (BP Location: Left Arm, Patient Position: Sitting, Cuff Size: Normal)   Pulse 61   Ht 5\' 4"  (1.626 m)   Wt 152 lb 12.8 oz (69.3 kg)   BMI 26.23 kg/m    Skin warm and dry. External genitalia has no swelling, labia slightly red and has scaling still on inner thighs, has some vitiligo and has some redness at introitus with thin tissue, discussed with her that yeast resolved and looks like lichen sclerosus at introitus area. Terri Skains NP student assisted.  Assessment:     1. Lichen sclerosus et atrophicus Will rx temovate Meds ordered this encounter  Medications  . Clobetasol Prop Emollient Base 0.05 % emollient cream    Sig: Use thin layer twice a day for 2 weeks then 2-3 x weekly    Dispense:  45 g    Refill:  1    Order Specific Question:   Supervising Provider    Answer:   Florian Buff [[id number]]   Showed April Lang  pictures in [facility].    Plan:     Follow up in 3 months

## 2020-02-22 ENCOUNTER — Ambulatory Visit (HOSPITAL_COMMUNITY)
Admission: RE | Admit: 2020-02-22 | Discharge: 2020-02-22 | Disposition: A | Discharge status: Home / Self Care | Payer: Medicare Other | Source: Ambulatory Visit | Attending: Family Medicine | Admitting: Family Medicine

## 2020-02-22 ENCOUNTER — Other Ambulatory Visit: Payer: Self-pay

## 2020-02-22 DIAGNOSIS — Z1231 Encounter for screening mammogram for malignant neoplasm of breast: Secondary | ICD-10-CM

## 2020-02-26 ENCOUNTER — Other Ambulatory Visit (HOSPITAL_COMMUNITY): Payer: Self-pay | Admitting: Family Medicine

## 2020-02-26 DIAGNOSIS — R928 Other abnormal and inconclusive findings on diagnostic imaging of breast: Secondary | ICD-10-CM

## 2020-02-28 ENCOUNTER — Other Ambulatory Visit: Payer: Self-pay

## 2020-02-28 ENCOUNTER — Encounter: Payer: Self-pay | Admitting: Cardiovascular Disease

## 2020-02-28 ENCOUNTER — Ambulatory Visit (INDEPENDENT_AMBULATORY_CARE_PROVIDER_SITE_OTHER): Payer: Medicare Other | Admitting: Cardiovascular Disease

## 2020-02-28 VITALS — BP 148/66 | HR 60 | Ht 64.0 in | Wt 152.6 lb

## 2020-02-28 DIAGNOSIS — I10 Essential (primary) hypertension: Secondary | ICD-10-CM | POA: Diagnosis not present

## 2020-02-28 DIAGNOSIS — E7800 Pure hypercholesterolemia, unspecified: Secondary | ICD-10-CM

## 2020-02-28 DIAGNOSIS — I6522 Occlusion and stenosis of left carotid artery: Secondary | ICD-10-CM | POA: Diagnosis not present

## 2020-02-28 LAB — HEPATIC FUNCTION PANEL
ALT: 15 IU/L (ref 0–32)
AST: 17 IU/L (ref 0–40)
Albumin: 4.8 g/dL — ABNORMAL HIGH (ref 3.7–4.7)
Alkaline Phosphatase: 68 IU/L (ref 48–121)
Bilirubin Total: 0.5 mg/dL (ref 0.0–1.2)
Bilirubin, Direct: 0.14 mg/dL (ref 0.00–0.40)
Total Protein: 7.1 g/dL (ref 6.0–8.5)

## 2020-02-28 LAB — LIPID PANEL
Chol/HDL Ratio: 3.1 ratio (ref 0.0–4.4)
Cholesterol, Total: 176 mg/dL (ref 100–199)
HDL: 56 mg/dL (ref >39)
LDL Chol Calc (NIH): 93 mg/dL (ref 0–99)
Triglycerides: 158 mg/dL — ABNORMAL HIGH (ref 0–149)
VLDL Cholesterol Cal: 27 mg/dL (ref 5–40)

## 2020-02-28 NOTE — Assessment & Plan Note (Addendum)
History of essential hypertension on hydrochlorothiazide and losartan with blood pressure measured today at 160/60.  Follow-up blood pressure at the end of visit was 148/66.  She does say that she rushed to get here this morning and is under a lot of stress at home.

## 2020-02-28 NOTE — Patient Instructions (Signed)
Medication Instructions:  Your physician recommends that you continue on your current medications as directed. Please refer to the Current Medication list given to you today.  *If you need a refill on your cardiac medications before your next appointment, please call your pharmacy*   Lab Work: Your physician recommends that you return for a lipid profile and hepatic function panel today.  If you have labs (blood work) drawn today and your tests are completely normal, you will receive your results only by:  [place] (if you have MyChart) OR  A paper copy in the mail If you have any lab test that is abnormal or we need to change your treatment, we will call you to review the results.  Follow-Up: At [facility], you and your health needs are our priority.  As part of our continuing mission to provide you with exceptional heart care, we have created designated Provider Care Teams.  These Care Teams include your primary Cardiologist (physician) and Advanced Practice Providers (APPs -  Physician Assistants and Nurse Practitioners) who all work together to provide you with the care you need, when you need it.  We recommend signing up for the patient portal called "MyChart".  Sign up information is provided on this After Visit Summary.  MyChart is used to connect with patients for Virtual Visits (Telemedicine).  Patients are able to view lab/test results, encounter notes, upcoming appointments, etc.  Non-urgent messages can be sent to your provider as well.   To learn more about what you can do with MyChart, go to [email or web address].    Your next appointment:   12 month(s)  The format for your next appointment:   In Person  Provider:   You may see Quay Burow, MD or one of the following Advanced Practice Providers on your designated Care Team:    Kerin Ransom, PA-C  [place], [place]  Coletta Memos,     Other Instructions Please call our office 2 months in  advance to schedule your follow-up appointment with Dr. [name].

## 2020-02-28 NOTE — Assessment & Plan Note (Signed)
Moderate left ICA stenosis by duplex ultrasound 03/20/2019.  She is scheduled for repeat study next month.

## 2020-02-28 NOTE — Assessment & Plan Note (Signed)
History of hyperlipidemia on low-dose statin therapy with lipid profile performed 11/06/2019 revealing total cholesterol 216, LDL 127 and HDL 54.  She did have a problem with medication compliance.  We will recheck a lipid liver profile this morning.

## 2020-02-28 NOTE — Progress Notes (Signed)
02/28/2020 April Lang   [birth year]  [id number]  Primary Physician Sharilyn Sites, MD Primary Cardiologist: Lorretta Harp MD Garret Reddish, [place], [place]  HPI:  April Lang is a 79 y.o.  Caucasian female who I last saw in the office  02/17/2019. She had a MET test in the past that was negative for ischemia, an echo that showed increased filling pressures. I began her on a diuretic to which she has had a variable response. She does have an ACE cough and was started on losartan instead. She denies chest pain or shortness of breath. Her primary care physician follows her lipid profile closelywhich was most recently performed 11/06/2019 revealing total cholesterol 216, LDL 127 and HDL 54 on pravastatin 10 mg a day.  Since I saw her a year ago she is remained stable.  She denies chest pain or shortness of breath.  We have been following her carotid Doppler studies which were performed in July of last year showing moderate left ICA stenosis.  She is neurologically asymptomatic.  Return in   Current Meds  Medication Sig  . acetaminophen (TYLENOL) 500 MG tablet Take 500 mg by mouth as needed for pain.  Marland Kitchen aspirin 81 MG chewable tablet Chew by mouth daily.  . Clobetasol Prop Emollient Base 0.05 % emollient cream Use thin layer twice a day for 2 weeks then 2-3 x weekly  . hydrochlorothiazide (HYDRODIURIL) 25 MG tablet TAKE ONE TABLET BY MOUTH DAILY.  Marland Kitchen levothyroxine (SYNTHROID, LEVOTHROID) 100 MCG tablet Take 100 mcg by mouth daily before breakfast.  . losartan (COZAAR) 50 MG tablet TAKE ONE TABLET BY MOUTH DAILY.  . pravastatin (PRAVACHOL) 10 MG tablet Take 10 mg by mouth daily.     Allergies  Allergen Reactions  . Codeine     Almost passed out.   . Lisinopril     Cough  . Other Rash    Arthritis med-not sure of name    Social History   Socioeconomic History  . Marital status: Married    Spouse name: Not on file  . Number of children: Not on file  . Years of education: Not on  file  . Highest education level: Not on file  Occupational History  . Not on file  Tobacco Use  . Smoking status: Never Smoker  . Smokeless tobacco: Never Used  Vaping Use  . Vaping Use: Never used  Substance and Sexual Activity  . Alcohol use: No  . Drug use: No  . Sexual activity: Not Currently    Birth control/protection: Surgical    Comment: hyst  Other Topics Concern  . Not on file  Social History Narrative  . Not on file   Social Determinants of Health   Financial Resource Strain:   . Difficulty of Paying Living Expenses:   Food Insecurity:   . Worried About Charity fundraiser in the Last Year:   . Arboriculturist in the Last Year:   Transportation Needs:   . Film/video editor (Medical):   Marland Kitchen Lack of Transportation (Non-Medical):   Physical Activity:   . Days of Exercise per Week:   . Minutes of Exercise per Session:   Stress:   . Feeling of Stress :   Social Connections:   . Frequency of Communication with Friends and Family:   . Frequency of Social Gatherings with Friends and Family:   . Attends Religious Services:   . Active Member of Clubs or Organizations:   .  Attends Archivist Meetings:   Marland Kitchen Marital Status:   Intimate Partner Violence:   . Fear of Current or Ex-Partner:   . Emotionally Abused:   Marland Kitchen Physically Abused:   . Sexually Abused:      Review of Systems: General: negative for chills, fever, night sweats or weight changes.  Cardiovascular: negative for chest pain, dyspnea on exertion, edema, orthopnea, palpitations, paroxysmal nocturnal dyspnea or shortness of breath Dermatological: negative for rash Respiratory: negative for cough or wheezing Urologic: negative for hematuria Abdominal: negative for nausea, vomiting, diarrhea, bright red blood per rectum, melena, or hematemesis Neurologic: negative for visual changes, syncope, or dizziness All other systems reviewed and are otherwise negative except as noted above.    Blood  pressure (!) 148/66, pulse 60, height 5' 4"  (1.626 m), weight 152 lb 9.6 oz (69.2 kg), SpO2 98 %.  General appearance: alert and no distress Neck: no adenopathy, no carotid bruit, no JVD, supple, symmetrical, trachea midline and thyroid not enlarged, symmetric, no tenderness/mass/nodules Lungs: clear to auscultation bilaterally Heart: regular rate and rhythm, S1, S2 normal, no murmur, click, rub or gallop Extremities: extremities normal, atraumatic, no cyanosis or edema Pulses: 2+ and symmetric Skin: Skin color, texture, turgor normal. No rashes or lesions Neurologic: Alert and oriented X 3, normal strength and tone. Normal symmetric reflexes. Normal coordination and gait  EKG sinus rhythm at 60 with left anterior fascicular block, borderline voltage for LVH and septal Q waves.  I personally reviewed this EKG.  ASSESSMENT AND PLAN:   Essential hypertension History of essential hypertension on hydrochlorothiazide and losartan with blood pressure measured today at 160/60.  Follow-up blood pressure at the end of visit was 148/66.  She does say that she rushed to get here this morning and is under a lot of stress at home.  Elevated cholesterol History of hyperlipidemia on low-dose statin therapy with lipid profile performed 11/06/2019 revealing total cholesterol 216, LDL 127 and HDL 54.  She did have a problem with medication compliance.  We will recheck a lipid liver profile this morning.  Carotid artery disease (HCC) Moderate left ICA stenosis by duplex ultrasound 03/20/2019.  She is scheduled for repeat study next month.      Lorretta Harp MD FACP,FACC,FAHA, [facility] 02/28/2020 9:45 AM

## 2020-03-05 ENCOUNTER — Other Ambulatory Visit: Payer: Self-pay

## 2020-03-05 DIAGNOSIS — E7800 Pure hypercholesterolemia, unspecified: Secondary | ICD-10-CM

## 2020-03-05 MED ORDER — PRAVASTATIN SODIUM 20 MG PO TABS: 20.0000 mg | ORAL_TABLET | Freq: Every day | ORAL | 3 refills | Status: DC

## 2020-03-19 ENCOUNTER — Encounter (HOSPITAL_COMMUNITY): Payer: Self-pay

## 2020-03-19 ENCOUNTER — Ambulatory Visit (HOSPITAL_COMMUNITY)
Admission: RE | Admit: 2020-03-19 | Discharge: 2020-03-19 | Disposition: A | Discharge status: Home / Self Care | Payer: Medicare Other | Source: Ambulatory Visit | Attending: Family Medicine | Admitting: Family Medicine

## 2020-03-19 ENCOUNTER — Other Ambulatory Visit: Payer: Self-pay

## 2020-03-19 DIAGNOSIS — R921 Mammographic calcification found on diagnostic imaging of breast: Secondary | ICD-10-CM | POA: Diagnosis not present

## 2020-03-19 DIAGNOSIS — R928 Other abnormal and inconclusive findings on diagnostic imaging of breast: Secondary | ICD-10-CM

## 2020-03-19 DIAGNOSIS — I6522 Occlusion and stenosis of left carotid artery: Secondary | ICD-10-CM | POA: Diagnosis present

## 2020-03-20 ENCOUNTER — Other Ambulatory Visit (HOSPITAL_COMMUNITY): Payer: Self-pay | Admitting: Cardiovascular Disease

## 2020-03-20 ENCOUNTER — Ambulatory Visit (HOSPITAL_BASED_OUTPATIENT_CLINIC_OR_DEPARTMENT_OTHER)
Admission: RE | Admit: 2020-03-20 | Discharge: 2020-03-20 | Disposition: A | Discharge status: Home / Self Care | Payer: Medicare Other | Source: Ambulatory Visit | Attending: Cardiovascular Disease | Admitting: Cardiovascular Disease

## 2020-03-20 ENCOUNTER — Other Ambulatory Visit: Payer: Self-pay | Admitting: Family Medicine

## 2020-03-20 DIAGNOSIS — I6523 Occlusion and stenosis of bilateral carotid arteries: Secondary | ICD-10-CM

## 2020-03-20 DIAGNOSIS — I6522 Occlusion and stenosis of left carotid artery: Secondary | ICD-10-CM | POA: Diagnosis not present

## 2020-03-20 DIAGNOSIS — R921 Mammographic calcification found on diagnostic imaging of breast: Secondary | ICD-10-CM

## 2020-03-26 DIAGNOSIS — H18413 Arcus senilis, bilateral: Secondary | ICD-10-CM | POA: Diagnosis not present

## 2020-03-26 DIAGNOSIS — Z961 Presence of intraocular lens: Secondary | ICD-10-CM | POA: Diagnosis not present

## 2020-03-26 DIAGNOSIS — E119 Type 2 diabetes mellitus without complications: Secondary | ICD-10-CM | POA: Diagnosis not present

## 2020-03-26 DIAGNOSIS — H04123 Dry eye syndrome of bilateral lacrimal glands: Secondary | ICD-10-CM | POA: Diagnosis not present

## 2020-03-28 ENCOUNTER — Other Ambulatory Visit: Payer: Self-pay

## 2020-04-05 ENCOUNTER — Ambulatory Visit
Admission: RE | Admit: 2020-04-05 | Discharge: 2020-04-05 | Disposition: A | Discharge status: Home / Self Care | Payer: Medicare Other | Source: Ambulatory Visit | Attending: Family Medicine | Admitting: Family Medicine

## 2020-04-05 ENCOUNTER — Other Ambulatory Visit: Payer: Self-pay

## 2020-04-05 DIAGNOSIS — R921 Mammographic calcification found on diagnostic imaging of breast: Secondary | ICD-10-CM

## 2020-04-05 DIAGNOSIS — N6022 Fibroadenosis of left breast: Secondary | ICD-10-CM | POA: Diagnosis not present

## 2020-04-05 HISTORY — PX: BREAST BIOPSY: SHX20

## 2020-04-05 HISTORY — PX: OTHER SURGICAL HISTORY: SHX169

## 2020-04-08 ENCOUNTER — Other Ambulatory Visit: Payer: Self-pay | Admitting: Cardiovascular Disease

## 2020-05-01 ENCOUNTER — Encounter: Payer: Self-pay | Admitting: Adult Health

## 2020-05-01 ENCOUNTER — Ambulatory Visit (INDEPENDENT_AMBULATORY_CARE_PROVIDER_SITE_OTHER): Payer: Medicare Other | Admitting: Adult Health

## 2020-05-01 VITALS — BP 163/66 | HR 58 | Ht 64.0 in | Wt 157.5 lb

## 2020-05-01 DIAGNOSIS — L900 Lichen sclerosus et atrophicus: Secondary | ICD-10-CM | POA: Diagnosis not present

## 2020-05-01 DIAGNOSIS — I6522 Occlusion and stenosis of left carotid artery: Secondary | ICD-10-CM

## 2020-05-01 NOTE — Progress Notes (Signed)
  Subjective:     Patient ID: April Lang, female   DOB: [birth year], 79 y.o.   MRN: [id number]  HPI April Lang is a 79 year old white female,married, sp hysterectomy back in follow up on Lichen sclerosus and is doing good, no tiching, still using temovate 2-3 x weekly. PCP is Dr Hilma Favors.  Review of Systems No itching Reviewed past medical,surgical, social and family history. Reviewed medications and allergies.     Objective:   Physical Exam BP (!) 163/66 (BP Location: Right Arm, Patient Position: Sitting, Cuff Size: Normal)   Pulse (!) 58   Ht 5\' 4"  (1.626 m)   Wt 157 lb 8 oz (71.4 kg)   BMI 27.03 kg/m  Skin warm and dry.Pelvic: external genitalia is no redness, skin is thin on inner labia and they have fused some. Tissues look much better. And she is aware it is chronic, and can come and go. Fall risk is low  Upstream - 05/01/20 0906      Pregnancy Intention Screening   Does the patient want to become pregnant in the next year? N/A    Does the patient's partner want to become pregnant in the next year? N/A    Would the patient like to discuss contraceptive options today? N/A      Contraception Wrap Up   Current Method No Method - Other Reason    End Method No Method - Other Reason    Contraception Counseling Provided No         Examination chaperoned by Genia Hotter RN.    Assessment:     1. Lichen sclerosus et atrophicus Continue temovate 2-3 x weekly    Plan:     Follow up prn

## 2020-05-09 ENCOUNTER — Other Ambulatory Visit: Payer: Self-pay

## 2020-05-09 DIAGNOSIS — E7800 Pure hypercholesterolemia, unspecified: Secondary | ICD-10-CM | POA: Diagnosis not present

## 2020-05-09 LAB — LIPID PANEL
Chol/HDL Ratio: 3.5 ratio (ref 0.0–4.4)
Cholesterol, Total: 163 mg/dL (ref 100–199)
HDL: 47 mg/dL (ref >39)
LDL Chol Calc (NIH): 83 mg/dL (ref 0–99)
Triglycerides: 193 mg/dL — ABNORMAL HIGH (ref 0–149)
VLDL Cholesterol Cal: 33 mg/dL (ref 5–40)

## 2020-05-16 ENCOUNTER — Telehealth: Payer: Self-pay | Admitting: Cardiovascular Disease

## 2020-05-16 NOTE — Telephone Encounter (Signed)
New message:     Patient calling stating that some one called her. Please call patient.

## 2020-05-16 NOTE — Telephone Encounter (Signed)
Spoke with the patient who states that someone had already called her back about lab results.

## 2020-05-29 DIAGNOSIS — Z23 Encounter for immunization: Secondary | ICD-10-CM | POA: Diagnosis not present

## 2020-07-02 ENCOUNTER — Other Ambulatory Visit: Payer: Self-pay

## 2020-07-02 ENCOUNTER — Encounter: Payer: Self-pay | Admitting: Dermatology

## 2020-07-02 ENCOUNTER — Ambulatory Visit (INDEPENDENT_AMBULATORY_CARE_PROVIDER_SITE_OTHER): Payer: Medicare Other | Admitting: Dermatology

## 2020-07-02 DIAGNOSIS — L918 Other hypertrophic disorders of the skin: Secondary | ICD-10-CM

## 2020-07-02 DIAGNOSIS — D485 Neoplasm of uncertain behavior of skin: Secondary | ICD-10-CM

## 2020-07-02 DIAGNOSIS — L821 Other seborrheic keratosis: Secondary | ICD-10-CM | POA: Diagnosis not present

## 2020-07-02 DIAGNOSIS — D239 Other benign neoplasm of skin, unspecified: Secondary | ICD-10-CM

## 2020-07-02 DIAGNOSIS — D235 Other benign neoplasm of skin of trunk: Secondary | ICD-10-CM

## 2020-07-02 DIAGNOSIS — D3617 Benign neoplasm of peripheral nerves and autonomic nervous system of trunk, unspecified: Secondary | ICD-10-CM | POA: Diagnosis not present

## 2020-07-02 DIAGNOSIS — D1801 Hemangioma of skin and subcutaneous tissue: Secondary | ICD-10-CM

## 2020-07-02 DIAGNOSIS — I6522 Occlusion and stenosis of left carotid artery: Secondary | ICD-10-CM | POA: Diagnosis not present

## 2020-07-05 DIAGNOSIS — Z23 Encounter for immunization: Secondary | ICD-10-CM | POA: Diagnosis not present

## 2020-07-31 DIAGNOSIS — I10 Essential (primary) hypertension: Secondary | ICD-10-CM | POA: Diagnosis not present

## 2020-07-31 DIAGNOSIS — E7849 Other hyperlipidemia: Secondary | ICD-10-CM | POA: Diagnosis not present

## 2020-07-31 DIAGNOSIS — Z6826 Body mass index (BMI) 26.0-26.9, adult: Secondary | ICD-10-CM | POA: Diagnosis not present

## 2020-07-31 DIAGNOSIS — Z1389 Encounter for screening for other disorder: Secondary | ICD-10-CM | POA: Diagnosis not present

## 2020-07-31 DIAGNOSIS — R7309 Other abnormal glucose: Secondary | ICD-10-CM | POA: Diagnosis not present

## 2020-07-31 DIAGNOSIS — Z0001 Encounter for general adult medical examination with abnormal findings: Secondary | ICD-10-CM | POA: Diagnosis not present

## 2020-07-31 DIAGNOSIS — M1991 Primary osteoarthritis, unspecified site: Secondary | ICD-10-CM | POA: Diagnosis not present

## 2020-07-31 DIAGNOSIS — E039 Hypothyroidism, unspecified: Secondary | ICD-10-CM | POA: Diagnosis not present

## 2020-07-31 DIAGNOSIS — Z1331 Encounter for screening for depression: Secondary | ICD-10-CM | POA: Diagnosis not present

## 2020-08-01 ENCOUNTER — Encounter: Payer: Self-pay | Admitting: Dermatology

## 2020-08-11 NOTE — Progress Notes (Signed)
   Follow-Up Visit   Subjective  April Lang is a 79 y.o. female who presents for the following: Skin Problem (Patient here today to have a spot removed on her left mid back x years.  No bleeding just irritation due to location.).  New spots Location: Upper back Duration:  Quality:  Associated Signs/Symptoms: Prone to recurrent irritation Modifying Factors:  Severity:  Timing: Context:   Objective  Well appearing patient in no apparent distress; mood and affect are within normal limits.  All skin waist up examined.   Assessment & Plan    Hemangioma of skin (3) Chest - Medial [facility]); Right Upper Back; Mid Back  Benign okay to leave.  Dermatofibroma Left Breast  Benign okay to leave if stable.  Skin tag Neck - Anterior  Benign okay to leave if stable.  Seborrheic keratosis (2) Left Inframammary Fold; Right Inframammary Fold  Benign okay to leave.  Neoplasm of uncertain behavior of skin (2) Left Mid Back  Skin / nail biopsy Type of biopsy: tangential   Informed consent: discussed and consent obtained   Timeout: patient name, date of birth, surgical site, and procedure verified   Procedure prep:  Patient was prepped and draped in usual sterile fashion (Non sterile) Prep type:  Chlorhexidine Anesthesia: the lesion was anesthetized in a standard fashion   Anesthetic:  1% lidocaine w/ epinephrine 1-100,000 local infiltration Instrument used: flexible razor blade   Hemostasis achieved with: electrodesiccation   Outcome: patient tolerated procedure well   Post-procedure details: sterile dressing applied and wound care instructions given   Dressing type: bandage and petrolatum    Specimen 1 - Surgical pathology Differential Diagnosis: R/O NF Check Margins: No Cautery after biopsy  Mid Back  Skin / nail biopsy Type of biopsy: tangential   Informed consent: discussed and consent obtained   Timeout: patient name, date of birth, surgical site, and  procedure verified   Procedure prep:  Patient was prepped and draped in usual sterile fashion (Non sterile) Prep type:  Chlorhexidine Anesthesia: the lesion was anesthetized in a standard fashion   Anesthetic:  1% lidocaine w/ epinephrine 1-100,000 local infiltration Instrument used: flexible razor blade   Hemostasis achieved with: electrodesiccation   Outcome: patient tolerated procedure well   Post-procedure details: sterile dressing applied and wound care instructions given   Dressing type: bandage and petrolatum    Specimen 2 - Surgical pathology Differential Diagnosis: R/O Tag Check Margins: No Cautery after biopsy     I, [name], MD, have reviewed all documentation for this visit.  The documentation on 08/11/20 for the exam, diagnosis, procedures, and orders are all accurate and complete.

## 2020-08-20 ENCOUNTER — Other Ambulatory Visit: Payer: Self-pay | Admitting: Family Medicine

## 2020-08-20 DIAGNOSIS — R921 Mammographic calcification found on diagnostic imaging of breast: Secondary | ICD-10-CM

## 2020-09-05 DIAGNOSIS — M1711 Unilateral primary osteoarthritis, right knee: Secondary | ICD-10-CM | POA: Diagnosis not present

## 2020-09-05 DIAGNOSIS — M25561 Pain in right knee: Secondary | ICD-10-CM | POA: Diagnosis not present

## 2020-10-07 ENCOUNTER — Ambulatory Visit
Admission: RE | Admit: 2020-10-07 | Discharge: 2020-10-07 | Disposition: A | Discharge status: Home / Self Care | Payer: Medicare Other | Source: Ambulatory Visit | Attending: Family Medicine | Admitting: Family Medicine

## 2020-10-07 ENCOUNTER — Other Ambulatory Visit: Payer: Self-pay

## 2020-10-07 ENCOUNTER — Other Ambulatory Visit: Payer: Self-pay | Admitting: Family Medicine

## 2020-10-07 DIAGNOSIS — R921 Mammographic calcification found on diagnostic imaging of breast: Secondary | ICD-10-CM

## 2020-10-11 DIAGNOSIS — M25561 Pain in right knee: Secondary | ICD-10-CM | POA: Diagnosis not present

## 2020-10-14 ENCOUNTER — Other Ambulatory Visit: Payer: Self-pay | Admitting: Adult Health

## 2020-10-14 ENCOUNTER — Other Ambulatory Visit: Payer: Self-pay | Admitting: Cardiovascular Disease

## 2020-11-08 DIAGNOSIS — M25561 Pain in right knee: Secondary | ICD-10-CM | POA: Diagnosis not present

## 2020-12-03 DIAGNOSIS — Y999 Unspecified external cause status: Secondary | ICD-10-CM | POA: Diagnosis not present

## 2020-12-03 DIAGNOSIS — M11261 Other chondrocalcinosis, right knee: Secondary | ICD-10-CM | POA: Diagnosis not present

## 2020-12-03 DIAGNOSIS — S83231A Complex tear of medial meniscus, current injury, right knee, initial encounter: Secondary | ICD-10-CM | POA: Diagnosis not present

## 2020-12-03 DIAGNOSIS — M94261 Chondromalacia, right knee: Secondary | ICD-10-CM | POA: Diagnosis not present

## 2020-12-03 DIAGNOSIS — S83271A Complex tear of lateral meniscus, current injury, right knee, initial encounter: Secondary | ICD-10-CM | POA: Diagnosis not present

## 2020-12-03 DIAGNOSIS — S83281A Other tear of lateral meniscus, current injury, right knee, initial encounter: Secondary | ICD-10-CM | POA: Diagnosis not present

## 2020-12-03 DIAGNOSIS — X58XXXA Exposure to other specified factors, initial encounter: Secondary | ICD-10-CM | POA: Diagnosis not present

## 2020-12-06 ENCOUNTER — Other Ambulatory Visit: Payer: Self-pay

## 2020-12-06 ENCOUNTER — Ambulatory Visit (HOSPITAL_COMMUNITY): Payer: Medicare Other | Attending: Specialist

## 2020-12-06 ENCOUNTER — Encounter (HOSPITAL_COMMUNITY): Payer: Self-pay

## 2020-12-06 DIAGNOSIS — R2681 Unsteadiness on feet: Secondary | ICD-10-CM | POA: Diagnosis not present

## 2020-12-06 DIAGNOSIS — R262 Difficulty in walking, not elsewhere classified: Secondary | ICD-10-CM | POA: Diagnosis not present

## 2020-12-06 DIAGNOSIS — M25561 Pain in right knee: Secondary | ICD-10-CM | POA: Diagnosis not present

## 2020-12-06 NOTE — Patient Instructions (Signed)
Access Code: [email or web address] URL: [email or web address] Date: 12/06/2020 Prepared by: Sherlyn Lees  Exercises Supine Quad Set - 1 x daily - 7 x weekly - 3 sets - 10 reps - 2 sec hold Supine Heel Slide with Strap - 1 x daily - 7 x weekly - 3 sets - 10 reps - 5 sec hold Supine Knee Extension Mobilization with Weight - 3 x daily - 7 x weekly Supine Ankle Pumps - 1 x daily - 7 x weekly - 3 sets - 10 reps

## 2020-12-06 NOTE — Therapy (Signed)
[place], [place], [address] Phone: [phone]   Fax:  [phone]  Physical Therapy Evaluation  Patient Details  Name: April Lang MRN: [id number] Date of Birth: Mar 19, 1941 Referring Provider (PT): Dr. Sydnee Cabal   Encounter Date: 12/06/2020   PT End of Session - 12/06/20 0859    Visit Number 1    Number of Visits 12    Date for PT Re-Evaluation 01/17/21    Authorization Type Medicare    PT Start Time 0900    PT Stop Time 0945    PT Time Calculation (min) 45 min    Activity Tolerance Patient tolerated treatment well    Behavior During Therapy [facility] for tasks assessed/performed           Past Medical History:  Diagnosis Date  . Arthritis   . Asthma   . COPD (chronic obstructive pulmonary disease) ([place])   . Cystocele 01/19/2013  . Hypertension   . Hypothyroidism   . Pre-diabetes   . Stress incontinence 01/19/2013  . Thyroid disease     Past Surgical History:  Procedure Laterality Date  . ABDOMINAL HYSTERECTOMY    . breast biopsy Left 04/05/2020  . BREAST BIOPSY Left 04/05/2020  . CATARACT EXTRACTION    . CHOLECYSTECTOMY    . DILATION AND CURETTAGE OF UTERUS    . HAND SURGERY Right    screws put in fingers  . TUBAL LIGATION    . WRIST SURGERY Left     There were no vitals filed for this visit.    Subjective Assessment - 12/06/20 0916    Subjective Pt reports right knee scope surgery on 12/03/20 (presumably pt had meniscus removed) and presnets with limitations in walking, right knee edema, and limited ROM    Currently in Pain? Yes    Pain Score 1     Pain Location Knee    Pain Orientation Right    Pain Descriptors / Indicators Aching    Pain Type Surgical pain    Pain Onset In the past 7 days    Aggravating Factors  WBing, standing, walking    Pain Relieving Factors ice, position change              [facility] PT Assessment - 12/06/20 0001      Assessment   Medical Diagnosis  Encounter for other orthopedic aftercare-Right Knee Scope    Referring Provider (PT) Dr. Sydnee Cabal    Onset Date/Surgical Date 12/03/20      Restrictions   Weight Bearing Restrictions Yes    RLE Weight Bearing Weight bearing as tolerated      Balance Screen   Has the patient fallen in the past 6 months No    Has the patient had a decrease in activity level because of a fear of falling?  No    Is the patient reluctant to leave their home because of a fear of falling?  No      Home Ecologist residence    Living Arrangements Spouse/significant other    Available Help at Discharge Family    Type of [place] Access Level entry    [place]      Prior Function   Level of [place] Retired    Leisure yard work      Observation/Other Assessments   Focus on Therapeutic  Outcomes (FOTO)  41.2% function      ROM / Strength   AROM / PROM / Strength AROM;Strength      AROM   AROM Assessment Site Knee    Right/Left Knee Right    Right Knee Extension 10   lacking   Right Knee Flexion 85      Strength   Strength Assessment Site Knee    Right/Left Knee Right    Right Knee Flexion 3/5    Right Knee Extension 3-/5      Transfers   Transfers Sit to Stand;Stand Pivot Transfers    Sit to Stand 6: Modified independent (Device/Increase time)      Ambulation/Gait   Ambulation/Gait Yes    Ambulation Distance (Feet) 95 Feet    Assistive device Crutches    Gait Pattern Step-to pattern    Ambulation Surface Level;Indoor    Gait velocity decreased    Gait Comments 2MWT                      Objective measurements completed on examination: See above findings.       [facility] Adult PT Treatment/Exercise - 12/06/20 0001      Exercises   Exercises Knee/Hip      Knee/Hip Exercises: Supine   Quad Sets Strengthening;Right;2 sets;10 reps    Heel Slides AAROM;Right;2  sets;10 reps    Heel Slides Limitations 5 sec    Terminal Knee Extension PROM   knee extension heel prop                 PT Education - 12/06/20 0921    Education Details Pt education on POC details and assessment findings and timeline of tissue healing    Person(s) Educated Patient    Methods Explanation;Handout    Comprehension Verbalized understanding            PT Short Term Goals - 12/06/20 0940      PT SHORT TERM GOAL #1   Title Patient will report at least 25% improvement in symptoms for improved quality of life.    Time 2    Period Weeks    Status New    Target Date 12/20/20      PT SHORT TERM GOAL #2   Title Demo right knee flexion to 110 degrees to improve safety with transfers    Baseline 85 degrees flexion    Time 2    Period Weeks    Status New    Target Date 12/20/20      PT SHORT TERM GOAL #3   Title Demo right knee extension to 0 degrees to facilitate normal gait mechanics    Baseline lacking 10 degrees    Time 2    Period Weeks    Status New    Target Date 12/20/20      PT SHORT TERM GOAL #4   Title Patient will be independent with HEP in order to improve functional outcomes.    Time 2    Period Weeks    Status New    Target Date 12/20/20             PT Long Term Goals - 12/06/20 0942      PT LONG TERM GOAL #1   Title Patient will improve on FOTO score to meet predicted outcomes to improve functional independence    Baseline 41.2% function    Time 4    Period Weeks    Status New  Target Date 01/03/21      PT LONG TERM GOAL #2   Title Patient will be able to ambulate at least 200 feet in 2MWT in order to demonstrate improved gait speed for community ambulation.    Baseline 95 ft with single crutch    Time 4    Period Weeks    Target Date 01/03/21                  Plan - 12/06/20 0930    Clinical Impression Statement Patient is a 80 yo lady presenting to physical therapy with c/o right knee pain and ROM/strength  limitations s/p knee scope. She presents with pain limited deficits in Right knee strength, ROM, endurance, postural impairments,and functional mobility with ADL. She is having to modify and restrict ADL as indicated by FOTO score as well as subjective information and objective measures which is affecting overall participation. Patient will benefit from skilled physical therapy in order to improve function and reduce impairment.    Personal Factors and Comorbidities Age;Time since onset of injury/illness/exacerbation    Examination-Activity Limitations Bend;Carry;Lift;Stand;Stairs;Squat;Locomotion Level;Transfers    Examination-Participation Restrictions Cleaning;Community Activity;Driving;Yard Work;Meal Prep    Stability/Clinical Decision Making Stable/Uncomplicated    Clinical Decision Making Low    Rehab Potential Excellent    PT Frequency 2x / week    PT Duration 6 weeks    PT Treatment/Interventions ADLs/Self Care Home Management;Aquatic Therapy;Biofeedback;Cryotherapy;Electrical Stimulation;DME Instruction;Ultrasound;Traction;Gait training;Stair training;Functional mobility training;Therapeutic activities;Therapeutic exercise;Balance training;Patient/family education;Neuromuscular re-education;Manual techniques;Passive range of motion;Taping;Energy conservation;Joint Manipulations    PT Next Visit Plan Initiated WBing exercises as tolerated, e.g. mini-squats, knee flexion    PT Home Exercise Plan QS, heel slides, knee extension prop, seated heel slides, ankle pumps    Consulted and Agree with Plan of Care Patient           Patient will benefit from skilled therapeutic intervention in order to improve the following deficits and impairments:  Abnormal gait,Decreased activity tolerance,Decreased balance,Decreased mobility,Decreased knowledge of use of DME,Decreased endurance,Decreased range of motion,Decreased strength,Increased edema,Difficulty walking,Improper body mechanics,Pain  Visit  Diagnosis: Acute pain of right knee  Difficulty in walking, not elsewhere classified  Unsteadiness on feet     Problem List Patient Active Problem List   Diagnosis Date Noted  . Lichen sclerosus et atrophicus 01/30/2020  . Skin yeast infection 01/16/2020  . Vitiligo 01/16/2020  . Esophageal dysphagia 12/26/2019  . History of IBS 12/26/2019  . Carotid artery disease ([place]) 02/16/2017  . Elevated hemoglobin A1c 01/26/2017  . Well woman exam with routine gynecological exam 01/26/2017  . Anxiety 03/03/2016  . Elevated cholesterol 02/27/2015  . Dilated cbd, acquired 11/15/2013  . GERD (gastroesophageal reflux disease) 11/15/2013  . Lung nodule 10/26/2013  . Mixed incontinence urge and stress (female)(female) 02/28/2013  . Cystocele 01/19/2013  . Hypothyroid 01/19/2013  . Asthma 01/18/2013  . Essential hypertension 01/18/2013   9:44 AM, 12/06/20 M. Sherlyn Lees, PT, DPT Physical Therapist- [place] Office Number: [phone]  [place] [address] [place], [place], [address] Phone: [phone]   Fax:  [phone]  Name: DAVAN HARK MRN: [id number] Date of Birth: 1941/04/26

## 2020-12-09 ENCOUNTER — Ambulatory Visit (HOSPITAL_COMMUNITY): Payer: Medicare Other

## 2020-12-09 ENCOUNTER — Other Ambulatory Visit: Payer: Self-pay

## 2020-12-09 DIAGNOSIS — M25561 Pain in right knee: Secondary | ICD-10-CM | POA: Diagnosis not present

## 2020-12-09 DIAGNOSIS — R262 Difficulty in walking, not elsewhere classified: Secondary | ICD-10-CM

## 2020-12-09 DIAGNOSIS — R2681 Unsteadiness on feet: Secondary | ICD-10-CM | POA: Diagnosis not present

## 2020-12-09 NOTE — Therapy (Signed)
[place], [place], [address] Phone: [phone]   Fax:  [phone]  Physical Therapy Treatment  Patient Details  Name: April Lang MRN: [id number] Date of Birth: Mar 22, 1941 Referring Provider (PT): Dr. Sydnee Cabal   Encounter Date: 12/09/2020   PT End of Session - 12/09/20 0950    Visit Number 2    Number of Visits 12    Date for PT Re-Evaluation 01/17/21    Authorization Type Medicare    PT Start Time 0945    PT Stop Time 1030    PT Time Calculation (min) 45 min    Activity Tolerance Patient tolerated treatment well    Behavior During Therapy [facility] for tasks assessed/performed           Past Medical History:  Diagnosis Date  . Arthritis   . Asthma   . COPD (chronic obstructive pulmonary disease) ([place])   . Cystocele 01/19/2013  . Hypertension   . Hypothyroidism   . Pre-diabetes   . Stress incontinence 01/19/2013  . Thyroid disease     Past Surgical History:  Procedure Laterality Date  . ABDOMINAL HYSTERECTOMY    . breast biopsy Left 04/05/2020  . BREAST BIOPSY Left 04/05/2020  . CATARACT EXTRACTION    . CHOLECYSTECTOMY    . DILATION AND CURETTAGE OF UTERUS    . HAND SURGERY Right    screws put in fingers  . TUBAL LIGATION    . WRIST SURGERY Left     There were no vitals filed for this visit.   Subjective Assessment - 12/09/20 0949    Subjective Pt reports feeling better but notes the knee is sore/stiff this AM. Pt reports no use of AD since Friday    Currently in Pain? Yes    Pain Score 2     Pain Location Knee    Pain Orientation Right    Pain Descriptors / Indicators Aching    Pain Type Surgical pain    Pain Onset In the past 7 days              [facility] PT Assessment - 12/09/20 0001      Assessment   Medical Diagnosis Encounter for other orthopedic aftercare-Right Knee Scope    Referring Provider (PT) Dr. Sydnee Cabal    Onset Date/Surgical Date 12/03/20    Next MD  Visit 12/16/20                         [facility] Adult PT Treatment/Exercise - 12/09/20 0001      Knee/Hip Exercises: Aerobic   Recumbent Bike AAROM x 4 min, retro-pedaling x 2 min      Knee/Hip Exercises: Seated   Sit to Sand 20 reps;without UE support   mirror for visual feeback     Knee/Hip Exercises: Supine   Quad Sets Strengthening;Right;3 sets;10 reps    Heel Slides AAROM;Right;2 sets;10 reps    Heel Slides Limitations 5 sec hold    Knee Extension AROM    Knee Extension Limitations 5, lacking    Knee Flexion AROM;Right    Knee Flexion Limitations 98 degrees      Knee/Hip Exercises: Prone   Hamstring Curl 2 sets;10 reps    Other Prone Exercises Prone TKE 2x10                  PT Education - 12/09/20 1010    Education Details education on HEP additions  Person(s) Educated Patient    Methods Explanation;Handout    Comprehension Verbalized understanding            PT Short Term Goals - 12/06/20 0940      PT SHORT TERM GOAL #1   Title Patient will report at least 25% improvement in symptoms for improved quality of life.    Time 2    Period Weeks    Status New    Target Date 12/20/20      PT SHORT TERM GOAL #2   Title Demo right knee flexion to 110 degrees to improve safety with transfers    Baseline 85 degrees flexion    Time 2    Period Weeks    Status New    Target Date 12/20/20      PT SHORT TERM GOAL #3   Title Demo right knee extension to 0 degrees to facilitate normal gait mechanics    Baseline lacking 10 degrees    Time 2    Period Weeks    Status New    Target Date 12/20/20      PT SHORT TERM GOAL #4   Title Patient will be independent with HEP in order to improve functional outcomes.    Time 2    Period Weeks    Status New    Target Date 12/20/20             PT Long Term Goals - 12/06/20 0942      PT LONG TERM GOAL #1   Title Patient will improve on FOTO score to meet predicted outcomes to improve functional  independence    Baseline 41.2% function    Time 4    Period Weeks    Status New    Target Date 01/03/21      PT LONG TERM GOAL #2   Title Patient will be able to ambulate at least 200 feet in 2MWT in order to demonstrate improved gait speed for community ambulation.    Baseline 95 ft with single crutch    Time 4    Period Weeks    Target Date 01/03/21                 Plan - 12/09/20 1010    Clinical Impression Statement Progressing with POC details and able to achieve 98 degrees right knee flexion without much discomfort. Continues to ambulate with decreased weight acceptance RLE advised to use cane until normalized pattern is achieved    Personal Factors and Comorbidities Age;Time since onset of injury/illness/exacerbation    Examination-Activity Limitations Bend;Carry;Lift;Stand;Stairs;Squat;Locomotion Level;Transfers    Examination-Participation Restrictions Cleaning;Community Activity;Driving;Yard Work;Meal Prep    Stability/Clinical Decision Making Stable/Uncomplicated    Rehab Potential Excellent    PT Frequency 2x / week    PT Duration 6 weeks    PT Treatment/Interventions ADLs/Self Care Home Management;Aquatic Therapy;Biofeedback;Cryotherapy;Electrical Stimulation;DME Instruction;Ultrasound;Traction;Gait training;Stair training;Functional mobility training;Therapeutic activities;Therapeutic exercise;Balance training;Patient/family education;Neuromuscular re-education;Manual techniques;Passive range of motion;Taping;Energy conservation;Joint Manipulations    PT Next Visit Plan Initiated WBing exercises as tolerated, e.g. mini-squats, knee flexion    PT Home Exercise Plan QS, heel slides, knee extension prop, seated heel slides, ankle pumps, prone HS curls, prone TKE, sit-stand from elevated surface    Consulted and Agree with Plan of Care Patient           Patient will benefit from skilled therapeutic intervention in order to improve the following deficits and  impairments:  Abnormal gait,Decreased activity tolerance,Decreased balance,Decreased mobility,Decreased knowledge of use of  DME,Decreased endurance,Decreased range of motion,Decreased strength,Increased edema,Difficulty walking,Improper body mechanics,Pain  Visit Diagnosis: Acute pain of right knee  Difficulty in walking, not elsewhere classified  Unsteadiness on feet     Problem List Patient Active Problem List   Diagnosis Date Noted  . Lichen sclerosus et atrophicus 01/30/2020  . Skin yeast infection 01/16/2020  . Vitiligo 01/16/2020  . Esophageal dysphagia 12/26/2019  . History of IBS 12/26/2019  . Carotid artery disease ([place]) 02/16/2017  . Elevated hemoglobin A1c 01/26/2017  . Well woman exam with routine gynecological exam 01/26/2017  . Anxiety 03/03/2016  . Elevated cholesterol 02/27/2015  . Dilated cbd, acquired 11/15/2013  . GERD (gastroesophageal reflux disease) 11/15/2013  . Lung nodule 10/26/2013  . Mixed incontinence urge and stress (female)(female) 02/28/2013  . Cystocele 01/19/2013  . Hypothyroid 01/19/2013  . Asthma 01/18/2013  . Essential hypertension 01/18/2013   10:31 AM, 12/09/20 M. Sherlyn Lees, PT, DPT Physical Therapist- [facility] Office Number: [phone]  [facility] [address] [place], [place], [address] Phone: [phone]   Fax:  [phone]  Name: April Lang MRN: [id number] Date of Birth: [birth year]

## 2020-12-11 ENCOUNTER — Other Ambulatory Visit: Payer: Self-pay

## 2020-12-11 ENCOUNTER — Encounter (HOSPITAL_COMMUNITY): Payer: Self-pay

## 2020-12-11 ENCOUNTER — Ambulatory Visit (HOSPITAL_COMMUNITY): Payer: Medicare Other

## 2020-12-11 DIAGNOSIS — M25561 Pain in right knee: Secondary | ICD-10-CM | POA: Diagnosis not present

## 2020-12-11 DIAGNOSIS — R2681 Unsteadiness on feet: Secondary | ICD-10-CM | POA: Diagnosis not present

## 2020-12-11 DIAGNOSIS — R262 Difficulty in walking, not elsewhere classified: Secondary | ICD-10-CM

## 2020-12-11 NOTE — Therapy (Signed)
[place], [place], [address] Phone: [phone]   Fax:  [phone]  Physical Therapy Treatment  Patient Details  Name: April Lang MRN: [id number] Date of Birth: 1941/01/11 Referring Provider (PT): Dr. Sydnee Cabal   Encounter Date: 12/11/2020   PT End of Session - 12/11/20 0953    Visit Number 3    Number of Visits 12    Date for PT Re-Evaluation 01/17/21    Authorization Type Medicare    PT Start Time 2355    PT Stop Time 1030    PT Time Calculation (min) 43 min    Activity Tolerance Patient tolerated treatment well    Behavior During Therapy [facility] for tasks assessed/performed           Past Medical History:  Diagnosis Date  . Arthritis   . Asthma   . COPD (chronic obstructive pulmonary disease) ([place])   . Cystocele 01/19/2013  . Hypertension   . Hypothyroidism   . Pre-diabetes   . Stress incontinence 01/19/2013  . Thyroid disease     Past Surgical History:  Procedure Laterality Date  . ABDOMINAL HYSTERECTOMY    . breast biopsy Left 04/05/2020  . BREAST BIOPSY Left 04/05/2020  . CATARACT EXTRACTION    . CHOLECYSTECTOMY    . DILATION AND CURETTAGE OF UTERUS    . HAND SURGERY Right    screws put in fingers  . TUBAL LIGATION    . WRIST SURGERY Left     There were no vitals filed for this visit.   Subjective Assessment - 12/11/20 0952    Subjective Soreness in right knee from exercises and walking. Pressure pain noted when laying prone    Currently in Pain? No/denies    Pain Score 0-No pain    Pain Location Knee    Pain Orientation Right    Pain Descriptors / Indicators Aching    Pain Onset In the past 7 days              [facility] PT Assessment - 12/11/20 0001      Assessment   Medical Diagnosis Encounter for other orthopedic aftercare-Right Knee Scope    Referring Provider (PT) Dr. Sydnee Cabal    Onset Date/Surgical Date 12/03/20    Next MD Visit 12/16/20                          [facility] Adult PT Treatment/Exercise - 12/11/20 0001      Knee/Hip Exercises: Stretches   Gastroc Stretch Both;2 reps;60 seconds    Gastroc Stretch Limitations slantboard      Knee/Hip Exercises: Aerobic   Recumbent Bike AAROM x 5 min, retro-pedaling x 2 min      Knee/Hip Exercises: Machines for Strengthening   Cybex Knee Flexion 4 plates, 3x10 with flexion stop at 70 degrees      Knee/Hip Exercises: Standing   Other Standing Knee Exercises standing on airex pad lateral weight shift x 2 min. Tandem stance 3x15 sec BLE      Knee/Hip Exercises: Seated   Long Arc Quad AROM;Right;3 sets;10 reps      Knee/Hip Exercises: Supine   Quad Sets Strengthening;Right;3 sets;10 reps    Heel Slides AAROM;Right;2 sets;10 reps    Heel Slides Limitations 5 sec hold    Knee Extension AROM    Knee Extension Limitations 5, lacking    Knee Flexion AROM;Right    Knee Flexion Limitations 93  degrees                    PT Short Term Goals - 12/06/20 0940      PT SHORT TERM GOAL #1   Title Patient will report at least 25% improvement in symptoms for improved quality of life.    Time 2    Period Weeks    Status New    Target Date 12/20/20      PT SHORT TERM GOAL #2   Title Demo right knee flexion to 110 degrees to improve safety with transfers    Baseline 85 degrees flexion    Time 2    Period Weeks    Status New    Target Date 12/20/20      PT SHORT TERM GOAL #3   Title Demo right knee extension to 0 degrees to facilitate normal gait mechanics    Baseline lacking 10 degrees    Time 2    Period Weeks    Status New    Target Date 12/20/20      PT SHORT TERM GOAL #4   Title Patient will be independent with HEP in order to improve functional outcomes.    Time 2    Period Weeks    Status New    Target Date 12/20/20             PT Long Term Goals - 12/06/20 0942      PT LONG TERM GOAL #1   Title Patient will improve on FOTO score to meet  predicted outcomes to improve functional independence    Baseline 41.2% function    Time 4    Period Weeks    Status New    Target Date 01/03/21      PT LONG TERM GOAL #2   Title Patient will be able to ambulate at least 200 feet in 2MWT in order to demonstrate improved gait speed for community ambulation.    Baseline 95 ft with single crutch    Time 4    Period Weeks    Target Date 01/03/21                 Plan - 12/11/20 1013    Clinical Impression Statement Pt notes increase in right knee swelling today with some soft tissue edema appreciated. Continues to ambulate with antalgic pattern and therapist reinforced use of cane to improve ambulation symmetry and tolerance. COntinues to exhibit extension limit of 5 degrees and flexion of 93 degrees    Personal Factors and Comorbidities Age;Time since onset of injury/illness/exacerbation    Examination-Activity Limitations Bend;Carry;Lift;Stand;Stairs;Squat;Locomotion Level;Transfers    Examination-Participation Restrictions Cleaning;Community Activity;Driving;Yard Work;Meal Prep    Stability/Clinical Decision Making Stable/Uncomplicated    Rehab Potential Excellent    PT Frequency 2x / week    PT Duration 6 weeks    PT Treatment/Interventions ADLs/Self Care Home Management;Aquatic Therapy;Biofeedback;Cryotherapy;Electrical Stimulation;DME Instruction;Ultrasound;Traction;Gait training;Stair training;Functional mobility training;Therapeutic activities;Therapeutic exercise;Balance training;Patient/family education;Neuromuscular re-education;Manual techniques;Passive range of motion;Taping;Energy conservation;Joint Manipulations    PT Next Visit Plan Initiated WBing exercises as tolerated, e.g. mini-squats, knee flexion    PT Home Exercise Plan QS, heel slides, knee extension prop, seated heel slides, ankle pumps, prone HS curls, prone TKE, sit-stand from elevated surface    Consulted and Agree with Plan of Care Patient            Patient will benefit from skilled therapeutic intervention in order to improve the following deficits and impairments:  Abnormal gait,Decreased activity  tolerance,Decreased balance,Decreased mobility,Decreased knowledge of use of DME,Decreased endurance,Decreased range of motion,Decreased strength,Increased edema,Difficulty walking,Improper body mechanics,Pain  Visit Diagnosis: Acute pain of right knee  Difficulty in walking, not elsewhere classified  Unsteadiness on feet     Problem List Patient Active Problem List   Diagnosis Date Noted  . Lichen sclerosus et atrophicus 01/30/2020  . Skin yeast infection 01/16/2020  . Vitiligo 01/16/2020  . Esophageal dysphagia 12/26/2019  . History of IBS 12/26/2019  . Carotid artery disease ([place]) 02/16/2017  . Elevated hemoglobin A1c 01/26/2017  . Well woman exam with routine gynecological exam 01/26/2017  . Anxiety 03/03/2016  . Elevated cholesterol 02/27/2015  . Dilated cbd, acquired 11/15/2013  . GERD (gastroesophageal reflux disease) 11/15/2013  . Lung nodule 10/26/2013  . Mixed incontinence urge and stress (female)(female) 02/28/2013  . Cystocele 01/19/2013  . Hypothyroid 01/19/2013  . Asthma 01/18/2013  . Essential hypertension 01/18/2013   10:25 AM, 12/11/20 M. Sherlyn Lees, PT, DPT Physical Therapist- [place] Office Number: [phone]  [facility] [address] [place], [place], [address] Phone: [phone]   Fax:  [phone]  Name: KASHAWN MANZANO MRN: [id number] Date of Birth: 11-Jun-1941

## 2020-12-13 ENCOUNTER — Encounter (HOSPITAL_COMMUNITY): Payer: Self-pay

## 2020-12-13 ENCOUNTER — Ambulatory Visit (HOSPITAL_COMMUNITY): Payer: Medicare Other

## 2020-12-13 ENCOUNTER — Other Ambulatory Visit: Payer: Self-pay

## 2020-12-13 DIAGNOSIS — M25561 Pain in right knee: Secondary | ICD-10-CM | POA: Diagnosis not present

## 2020-12-13 DIAGNOSIS — R2681 Unsteadiness on feet: Secondary | ICD-10-CM

## 2020-12-13 DIAGNOSIS — R262 Difficulty in walking, not elsewhere classified: Secondary | ICD-10-CM | POA: Diagnosis not present

## 2020-12-13 NOTE — Therapy (Signed)
[place], [place], [address] Phone: [phone]   Fax:  [phone]  Physical Therapy Treatment  Patient Details  Name: April Lang MRN: [id number] Date of Birth: 1941/05/09 Referring Provider (PT): Dr. Sydnee Cabal   Encounter Date: 12/13/2020   PT End of Session - 12/13/20 1057    Visit Number 4    Number of Visits 12    Date for PT Re-Evaluation 01/17/21    Authorization Type Medicare    PT Start Time 1049    PT Stop Time 1133    PT Time Calculation (min) 44 min    Activity Tolerance Patient tolerated treatment well    Behavior During Therapy [facility] for tasks assessed/performed           Past Medical History:  Diagnosis Date  . Arthritis   . Asthma   . COPD (chronic obstructive pulmonary disease) ([place])   . Cystocele 01/19/2013  . Hypertension   . Hypothyroidism   . Pre-diabetes   . Stress incontinence 01/19/2013  . Thyroid disease     Past Surgical History:  Procedure Laterality Date  . ABDOMINAL HYSTERECTOMY    . breast biopsy Left 04/05/2020  . BREAST BIOPSY Left 04/05/2020  . CATARACT EXTRACTION    . CHOLECYSTECTOMY    . DILATION AND CURETTAGE OF UTERUS    . HAND SURGERY Right    screws put in fingers  . TUBAL LIGATION    . WRIST SURGERY Left     There were no vitals filed for this visit.   Subjective Assessment - 12/13/20 1053    Subjective Pt reports some soreness anterior knee, pain only when hits a spot.  Reports she has been completed the sitting HEP exercises at home.  Reports she rode her lawn mover on Wednesday for an hour.    Currently in Pain? Yes    Pain Score 5     Pain Location Knee    Pain Orientation Right    Pain Descriptors / Indicators Sore    Pain Type Surgical pain    Pain Onset 1 to 4 weeks ago    Pain Frequency Intermittent    Aggravating Factors  WBing, standing, walking    Pain Relieving Factors ice, position change              [facility] PT  Assessment - 12/13/20 0001      Assessment   Medical Diagnosis Encounter for other orthopedic aftercare-Right Knee Scope    Referring Provider (PT) Dr. Sydnee Cabal    Onset Date/Surgical Date 12/03/20    Next MD Visit 12/16/20      Restrictions   Weight Bearing Restrictions Yes    RLE Weight Bearing Weight bearing as tolerated      Observation/Other Assessments   Focus on Therapeutic Outcomes (FOTO)  63.54% functional   was 41.2%     AROM   AROM Assessment Site Knee    Right/Left Knee Right    Right Knee Extension 5    Right Knee Flexion 104      Ambulation/Gait   Ambulation/Gait Yes    Ambulation Distance (Feet) 366 Feet   was 95   Assistive device None    Gait Pattern Step-to pattern    Ambulation Surface Level;Indoor    Gait velocity decreased                         [facility] Adult PT Treatment/Exercise -  12/13/20 0001      Exercises   Exercises Knee/Hip      Knee/Hip Exercises: Stretches   Knee: Self-Stretch to increase Flexion 5 reps;10 seconds    Knee: Self-Stretch Limitations knee drive on 19QQ step 5x 10" holds      Knee/Hip Exercises: Aerobic   Recumbent Bike AROM full revolution backwards then forward x 4 min seat 12      Knee/Hip Exercises: Standing   Heel Raises 10 reps    Terminal Knee Extension AROM;Strengthening;Right;10 reps;Theraband    Theraband Level (Terminal Knee Extension) Level 2 (Red)    Terminal Knee Extension Limitations 5" holds    Rocker Board 2 minutes    Rocker Board Limitations lateral    Gait Training 2WMT 366 ft no AD (was      Knee/Hip Exercises: Supine   Quad Sets Strengthening;10 reps;2 sets    Quad Sets Limitations 5" holds    Heel Slides AROM;Right;10 reps    Heel Slides Limitations 5" holds    Knee Extension AROM    Knee Extension Limitations 5 degrees lacking    Knee Flexion AROM;Right    Knee Flexion Limitations 104 degrees                  PT Education - 12/13/20 1056    Education Details  Educated benefits with elevation over heart for edema control    Person(s) Educated Patient    Methods Explanation    Comprehension Verbalized understanding            PT Short Term Goals - 12/13/20 1110      PT SHORT TERM GOAL #1   Title Patient will report at least 25% improvement in symptoms for improved quality of life.    Baseline 12/13/20: Reports 35% improvements in symptoms; no longer walking with AD, ease with standing, able to stand for longer duration to cook a meal. continues to have pain at night    Status Achieved      PT SHORT TERM GOAL #2   Title Demo right knee flexion to 110 degrees to improve safety with transfers    Baseline 12/13/20: AROM 5-104 degrees (was: 85 degrees flexion)    Status On-going      PT SHORT TERM GOAL #3   Title Demo right knee extension to 0 degrees to facilitate normal gait mechanics    Baseline 12/13/20: AROM 5-104 degrees; was lacking 10 degrees    Status On-going      PT SHORT TERM GOAL #4   Title Patient will be independent with HEP in order to improve functional outcomes.    Baseline 12/13/20: reports some compliance with HEP, does admit to taking some days with no exercises,    Status On-going             PT Long Term Goals - 12/13/20 1115      PT LONG TERM GOAL #1   Title Patient will improve on FOTO score to meet predicted outcomes to improve functional independence    Baseline 63.54% (was 41.2% function)    Status On-going      PT LONG TERM GOAL #2   Title Patient will be able to ambulate at least 200 feet in 2MWT in order to demonstrate improved gait speed for community ambulation.    Baseline 12/13/20: 366 no AD cueing for equal stance phase and heel to toe mechancis (was 95 ft with single crutch)    Status Partially Met  Plan - 12/13/20 1148    Clinical Impression Statement Pt arrived without AD, cueing to improve gait mechanics to reduce antalgic gait mechanics.  Added rockerboard to improve weight  distribution wiht vast improvements with gait following.  Also added standing knee drive for flexion and TKE for knee extension.  Pt stated she returns to MD on Monday, reviewed goals with objective testing inclduing 2MWT, ROM measurements and FOTO survey complete.  Pt progressing well with all goals with improve AROM 5-104 degrees, able to ambulate 366 ft without AD and FOTO improved to 63.54 (was 41% functional).  No reports of increased pain.  Educated pt benefits with elevation over heart to assist with edema control.    Personal Factors and Comorbidities Age;Time since onset of injury/illness/exacerbation    Examination-Activity Limitations Bend;Carry;Lift;Stand;Stairs;Squat;Locomotion Level;Transfers    Examination-Participation Restrictions Cleaning;Community Activity;Driving;Yard Work;Meal Prep    Stability/Clinical Decision Making Stable/Uncomplicated    Clinical Decision Making Low    Rehab Potential Excellent    PT Frequency 2x / week    PT Duration 6 weeks    PT Treatment/Interventions ADLs/Self Care Home Management;Aquatic Therapy;Biofeedback;Cryotherapy;Electrical Stimulation;DME Instruction;Ultrasound;Traction;Gait training;Stair training;Functional mobility training;Therapeutic activities;Therapeutic exercise;Balance training;Patient/family education;Neuromuscular re-education;Manual techniques;Passive range of motion;Taping;Energy conservation;Joint Manipulations    PT Next Visit Plan F/U with MD apt.  Continue knee mobility exercises/stretches prior strengthening.  Progress WBing exercises as tolerated, e.g. mini-squats, knee flexion    PT Home Exercise Plan QS, heel slides, knee extension prop, seated heel slides, ankle pumps, prone HS curls, prone TKE, sit-stand from elevated surface    Consulted and Agree with Plan of Care Patient           Patient will benefit from skilled therapeutic intervention in order to improve the following deficits and impairments:  Abnormal  gait,Decreased activity tolerance,Decreased balance,Decreased mobility,Decreased knowledge of use of DME,Decreased endurance,Decreased range of motion,Decreased strength,Increased edema,Difficulty walking,Improper body mechanics,Pain  Visit Diagnosis: Acute pain of right knee  Difficulty in walking, not elsewhere classified  Unsteadiness on feet     Problem List Patient Active Problem List   Diagnosis Date Noted  . Lichen sclerosus et atrophicus 01/30/2020  . Skin yeast infection 01/16/2020  . Vitiligo 01/16/2020  . Esophageal dysphagia 12/26/2019  . History of IBS 12/26/2019  . Carotid artery disease ([place]) 02/16/2017  . Elevated hemoglobin A1c 01/26/2017  . Well woman exam with routine gynecological exam 01/26/2017  . Anxiety 03/03/2016  . Elevated cholesterol 02/27/2015  . Dilated cbd, acquired 11/15/2013  . GERD (gastroesophageal reflux disease) 11/15/2013  . Lung nodule 10/26/2013  . Mixed incontinence urge and stress (female)(female) 02/28/2013  . Cystocele 01/19/2013  . Hypothyroid 01/19/2013  . Asthma 01/18/2013  . Essential hypertension 01/18/2013   Ihor Austin, LPTA/CLT; CBIS [phone]  Aldona Lento 12/13/2020, 1:19 PM  [facility] [address] [place], [place], [address] Phone: [phone]   Fax:  [phone]  Name: April Lang MRN: [id number] Date of Birth: [birth year]

## 2020-12-16 ENCOUNTER — Other Ambulatory Visit: Payer: Self-pay

## 2020-12-16 ENCOUNTER — Ambulatory Visit (HOSPITAL_COMMUNITY): Payer: Medicare Other | Admitting: Physical Therapy

## 2020-12-16 DIAGNOSIS — R262 Difficulty in walking, not elsewhere classified: Secondary | ICD-10-CM | POA: Diagnosis not present

## 2020-12-16 DIAGNOSIS — M25561 Pain in right knee: Secondary | ICD-10-CM | POA: Diagnosis not present

## 2020-12-16 DIAGNOSIS — R2681 Unsteadiness on feet: Secondary | ICD-10-CM | POA: Diagnosis not present

## 2020-12-16 NOTE — Therapy (Signed)
[place], [place], [address] Phone: [phone]   Fax:  [phone]  Physical Therapy Treatment  Patient Details  Name: April Lang MRN: [id number] Date of Birth: [birth year] Referring Provider (PT): Dr. Sydnee Cabal   Encounter Date: 12/16/2020   PT End of Session - 12/16/20 1712    Visit Number 5    Number of Visits 12    Date for PT Re-Evaluation 01/17/21    Authorization Type Medicare    PT Start Time 5093    PT Stop Time 1614    PT Time Calculation (min) 41 min    Activity Tolerance Patient tolerated treatment well    Behavior During Therapy [facility] for tasks assessed/performed           Past Medical History:  Diagnosis Date  . Arthritis   . Asthma   . COPD (chronic obstructive pulmonary disease) ([place])   . Cystocele 01/19/2013  . Hypertension   . Hypothyroidism   . Pre-diabetes   . Stress incontinence 01/19/2013  . Thyroid disease     Past Surgical History:  Procedure Laterality Date  . ABDOMINAL HYSTERECTOMY    . breast biopsy Left 04/05/2020  . BREAST BIOPSY Left 04/05/2020  . CATARACT EXTRACTION    . CHOLECYSTECTOMY    . DILATION AND CURETTAGE OF UTERUS    . HAND SURGERY Right    screws put in fingers  . TUBAL LIGATION    . WRIST SURGERY Left     There were no vitals filed for this visit.   Subjective Assessment - 12/16/20 1545    Subjective pt states she went back to MD and overall looks good.  STate she also drove to appt in [place] without difficutly.  No pain just soreness today.    Currently in Pain? No/denies              [facility] PT Assessment - 12/16/20 0001      AROM   Right Knee Extension 5   Lt is lacking 5 too due to arthritis   Right Knee Flexion 125                         [facility] Adult PT Treatment/Exercise - 12/16/20 0001      Knee/Hip Exercises: Stretches   Knee: Self-Stretch to increase Flexion 5 reps;10 seconds    Knee: Self-Stretch  Limitations knee drive on 26ZT step 5x 10" holds    Press photographer Both;2 reps;60 seconds    Gastroc Stretch Limitations slantboard      Knee/Hip Exercises: Aerobic   Recumbent Bike AROM full revolution backwards then forward x 4 min seat 12      Knee/Hip Exercises: Machines for Strengthening   Total Gym Leg Press 3 Plates 2X10      Knee/Hip Exercises: Standing   Heel Raises 10 reps    Knee Flexion Right;10 reps    Forward Lunges Left;Right;15 reps    Forward Lunges Limitations onto 4" step no UE's    Lateral Step Up Right;Hand Hold: 1;Step Height: 4"    Lateral Step Up Limitations eccentric control    Forward Step Up Right;Hand Hold: 1;Step Height: 4"    Stairs 6" reciprocal with bil HR, diff with descent with hip rotation    SLS max of 3 Rt:7", Lt:6" no UE      Knee/Hip Exercises: Supine   Knee Extension AROM    Knee Extension Limitations 5  degrees lacking bilaterally due to arthritis    Knee Flexion AROM;Right    Knee Flexion Limitations 125 degrees (lt is 130 degrees)                    PT Short Term Goals - 12/13/20 1110      PT SHORT TERM GOAL #1   Title Patient will report at least 25% improvement in symptoms for improved quality of life.    Baseline 12/13/20: Reports 35% improvements in symptoms; no longer walking with AD, ease with standing, able to stand for longer duration to cook a meal. continues to have pain at night    Status Achieved      PT SHORT TERM GOAL #2   Title Demo right knee flexion to 110 degrees to improve safety with transfers    Baseline 12/13/20: AROM 5-104 degrees (was: 85 degrees flexion)    Status On-going      PT SHORT TERM GOAL #3   Title Demo right knee extension to 0 degrees to facilitate normal gait mechanics    Baseline 12/13/20: AROM 5-104 degrees; was lacking 10 degrees    Status On-going      PT SHORT TERM GOAL #4   Title Patient will be independent with HEP in order to improve functional outcomes.    Baseline 12/13/20:  reports some compliance with HEP, does admit to taking some days with no exercises,    Status On-going             PT Long Term Goals - 12/13/20 1115      PT LONG TERM GOAL #1   Title Patient will improve on FOTO score to meet predicted outcomes to improve functional independence    Baseline 63.54% (was 41.2% function)    Status On-going      PT LONG TERM GOAL #2   Title Patient will be able to ambulate at least 200 feet in 2MWT in order to demonstrate improved gait speed for community ambulation.    Baseline 12/13/20: 366 no AD cueing for equal stance phase and heel to toe mechancis (was 95 ft with single crutch)    Status Partially Met                 Plan - 12/16/20 1710    Clinical Impression Statement Rt knee is doing well with improved ROM today of 5-125 degrees.  Lt knee is 5-130 degrees with noted arthritis preventing full extension .  Began more strengthening and stabilization exercises this session.    Pt with better time of 7" single leg stance (SLS) on Rt as compared to 6" max on Rt.   Began repeated step ups both forward and lateral using 1 HHA and 4" box.  Pt able to complete with cues for eccentric control but without any other cues or complaints of pain.  Pt able to ascend 6" steps using bil HR's without difficulty, however challenging to descend due to Rt knee instability and compensation of hip rotation to prevent excessive flexion.  Pt continued to remain painfree at end of session.    Personal Factors and Comorbidities Age;Time since onset of injury/illness/exacerbation    Examination-Activity Limitations Bend;Carry;Lift;Stand;Stairs;Squat;Locomotion Level;Transfers    Examination-Participation Restrictions Cleaning;Community Activity;Driving;Yard Work;Meal Prep    Stability/Clinical Decision Making Stable/Uncomplicated    Rehab Potential Excellent    PT Frequency 2x / week    PT Duration 6 weeks    PT Treatment/Interventions ADLs/Self Care Home  Management;Aquatic Therapy;Biofeedback;Cryotherapy;Electrical Stimulation;DME Instruction;Ultrasound;Traction;Gait  training;Stair training;Functional mobility training;Therapeutic activities;Therapeutic exercise;Balance training;Patient/family education;Neuromuscular re-education;Manual techniques;Passive range of motion;Taping;Energy conservation;Joint Manipulations    PT Next Visit Plan F/U with MD apt.  Continue knee mobility exercises/stretches prior strengthening.  Progress WBing exercises as tolerated, e.g. mini-squats, knee flexion    PT Home Exercise Plan continue focus on strengthening and functional returns.  Begin forward step downs using 4" step and add vectors.    Consulted and Agree with Plan of Care Patient           Patient will benefit from skilled therapeutic intervention in order to improve the following deficits and impairments:  Abnormal gait,Decreased activity tolerance,Decreased balance,Decreased mobility,Decreased knowledge of use of DME,Decreased endurance,Decreased range of motion,Decreased strength,Increased edema,Difficulty walking,Improper body mechanics,Pain  Visit Diagnosis: Difficulty in walking, not elsewhere classified  Unsteadiness on feet  Acute pain of right knee     Problem List Patient Active Problem List   Diagnosis Date Noted  . Lichen sclerosus et atrophicus 01/30/2020  . Skin yeast infection 01/16/2020  . Vitiligo 01/16/2020  . Esophageal dysphagia 12/26/2019  . History of IBS 12/26/2019  . Carotid artery disease ([place]) 02/16/2017  . Elevated hemoglobin A1c 01/26/2017  . Well woman exam with routine gynecological exam 01/26/2017  . Anxiety 03/03/2016  . Elevated cholesterol 02/27/2015  . Dilated cbd, acquired 11/15/2013  . GERD (gastroesophageal reflux disease) 11/15/2013  . Lung nodule 10/26/2013  . Mixed incontinence urge and stress (female)(female) 02/28/2013  . Cystocele 01/19/2013  . Hypothyroid 01/19/2013  . Asthma 01/18/2013  .  Essential hypertension 01/18/2013   Teena Irani, PTA/CLT [phone]  Teena Irani 12/16/2020, 5:19 PM  [facility] [address] [place], [place], [address] Phone: [phone]   Fax:  [phone]  Name: TONDA WIEDERHOLD MRN: [id number] Date of Birth: [birth year]

## 2020-12-18 ENCOUNTER — Ambulatory Visit (HOSPITAL_COMMUNITY): Payer: Medicare Other | Admitting: Physical Therapy

## 2020-12-18 ENCOUNTER — Encounter (HOSPITAL_COMMUNITY): Payer: Self-pay | Admitting: Physical Therapy

## 2020-12-18 ENCOUNTER — Other Ambulatory Visit: Payer: Self-pay

## 2020-12-18 DIAGNOSIS — R262 Difficulty in walking, not elsewhere classified: Secondary | ICD-10-CM | POA: Diagnosis not present

## 2020-12-18 DIAGNOSIS — M25561 Pain in right knee: Secondary | ICD-10-CM | POA: Diagnosis not present

## 2020-12-18 DIAGNOSIS — R2681 Unsteadiness on feet: Secondary | ICD-10-CM | POA: Diagnosis not present

## 2020-12-18 NOTE — Therapy (Signed)
[facility] [address] [place], [place], [address] Phone: [phone]   Fax:  [phone]  Physical Therapy Treatment and Discharge Note  Patient Details  Name: April Lang MRN: [id number] Date of Birth: 15-Jan-1941 Referring Provider (PT): Dr. Sydnee Cabal   PHYSICAL THERAPY DISCHARGE SUMMARY  Visits from Start of Care: 6  Current functional level related to goals / functional outcomes: See below   Remaining deficits: See below   Education / Equipment: *see below Plan: Patient agrees to discharge.  Patient goals were partially met. Patient is being discharged due to being pleased with the current functional level.  ?????             Encounter Date: 12/18/2020   PT End of Session - 12/18/20 0915    Visit Number 6    Number of Visits 12    Date for PT Re-Evaluation 01/17/21    Authorization Type Medicare    PT Start Time 0916    PT Stop Time 0955    PT Time Calculation (min) 39 min    Activity Tolerance Patient tolerated treatment well    Behavior During Therapy [facility] for tasks assessed/performed           Past Medical History:  Diagnosis Date  . Arthritis   . Asthma   . COPD (chronic obstructive pulmonary disease) ([place])   . Cystocele 01/19/2013  . Hypertension   . Hypothyroidism   . Pre-diabetes   . Stress incontinence 01/19/2013  . Thyroid disease     Past Surgical History:  Procedure Laterality Date  . ABDOMINAL HYSTERECTOMY    . breast biopsy Left 04/05/2020  . BREAST BIOPSY Left 04/05/2020  . CATARACT EXTRACTION    . CHOLECYSTECTOMY    . DILATION AND CURETTAGE OF UTERUS    . HAND SURGERY Right    screws put in fingers  . TUBAL LIGATION    . WRIST SURGERY Left     There were no vitals filed for this visit.   Subjective Assessment - 12/18/20 0922    Subjective States her pain was 4/10 pain and was irritating enough to keep her from going to sleep, repots she had to take a Tylenol. States  it hurts a little today but not as bad. Current pain noted as none. Pain was along anterior tib region last night. States that it still swollen but MD reports it may take 4 more weeks to go down.    Currently in Pain? No/denies              [facility] PT Assessment - 12/18/20 0001      Assessment   Medical Diagnosis Encounter for other orthopedic aftercare-Right Knee Scope    Referring Provider (PT) Dr. Sydnee Cabal    Onset Date/Surgical Date 12/03/20    Next MD Visit 12/16/20                         [facility] Adult PT Treatment/Exercise - 12/18/20 0001      Ambulation/Gait   Ambulation/Gait Yes    Ambulation Distance (Feet) 386 Feet    Assistive device None    Gait Pattern Decreased dorsiflexion - right    Ambulation Surface Level;Indoor    Gait Comments 2MW      Knee/Hip Exercises: Standing   Heel Raises 10 reps;5 seconds;Both    Other Standing Knee Exercises DF R 2x5 5" holds      Knee/Hip Exercises: Seated  Knee/Hip Flexion tandem x3 30" holds Bilateral on blue foam - intermittent hand holds    Other Seated Knee/Hip Exercises self mobilization to lower leg with rolling pin - 7 minutes total      Knee/Hip Exercises: Supine   Knee Extension AROM    Knee Extension Limitations 5 degrees lacking bilaterally due to arthritis    Knee Flexion AROM;Right    Knee Flexion Limitations 128      Manual Therapy   Manual Therapy Soft tissue mobilization    Manual therapy comments all manual interventions performed independently of other interventions    Soft tissue mobilization STM to right medial lower leg - as tolerated - very tender                  PT Education - 12/18/20 1002    Education Details on HEP, progress made, on POC and answered all questions.    Person(s) Educated Patient    Methods Explanation    Comprehension Verbalized understanding            PT Short Term Goals - 12/18/20 0950      PT SHORT TERM GOAL #1   Title Patient will report  at least 25% improvement in symptoms for improved quality of life.    Baseline reports she feels about 80% better    Status Achieved      PT SHORT TERM GOAL #2   Title Demo right knee flexion to 110 degrees to improve safety with transfers    Baseline 128    Status Achieved      PT SHORT TERM GOAL #3   Title Demo right knee extension to 0 degrees to facilitate normal gait mechanics    Baseline lackign 5 degrees    Status On-going      PT SHORT TERM GOAL #4   Title Patient will be independent with HEP in order to improve functional outcomes.    Baseline states she is doing something everyday    Status Achieved             PT Long Term Goals - 12/18/20 0944      PT LONG TERM GOAL #1   Title Patient will improve on FOTO score to meet predicted outcomes to improve functional independence    Baseline see objective section    Status On-going      PT LONG TERM GOAL #2   Title Patient will be able to ambulate at least 200 feet in 2MWT in order to demonstrate improved gait speed for community ambulation.    Status Achieved                 Plan - 12/18/20 1001    Clinical Impression Statement Pain noted along medial and lateral shin of right leg with pain here at night keeping her from sleeping. Added self mobilization to this area and tandem to address muscle weakness and dysfunction. Tolerated well but tandem balance very challenging for patient, added this to HEP. Patient reported she would like today to be her last session. Reviewed FOTO score and goals, patient has met 3/4 short term goals but remaining short term goal is ROM and though her right knee extension is lacking 5 degrees, her left knee is also lacking 5 degrees. Answered all question and HEP. Patient to discharge from PT  to HEP at this time secondary to progress made and desire to transition to HEP.    Personal Factors and Comorbidities Age;Time since onset of  injury/illness/exacerbation    Examination-Activity  Limitations Bend;Carry;Lift;Stand;Stairs;Squat;Locomotion Level;Transfers    Examination-Participation Restrictions Cleaning;Community Activity;Driving;Yard Work;Meal Prep    Stability/Clinical Decision Making Stable/Uncomplicated    Rehab Potential Excellent    PT Frequency 2x / week    PT Duration 6 weeks    PT Treatment/Interventions ADLs/Self Care Home Management;Aquatic Therapy;Biofeedback;Cryotherapy;Electrical Stimulation;DME Instruction;Ultrasound;Traction;Gait training;Stair training;Functional mobility training;Therapeutic activities;Therapeutic exercise;Balance training;Patient/family education;Neuromuscular re-education;Manual techniques;Passive range of motion;Taping;Energy conservation;Joint Manipulations    PT Next Visit Plan DC to HEP    PT Home Exercise Plan continue focus on strengthening and functional returns.  Begin forward step downs using 4" step and add vectors.    Consulted and Agree with Plan of Care Patient           Patient will benefit from skilled therapeutic intervention in order to improve the following deficits and impairments:  Abnormal gait,Decreased activity tolerance,Decreased balance,Decreased mobility,Decreased knowledge of use of DME,Decreased endurance,Decreased range of motion,Decreased strength,Increased edema,Difficulty walking,Improper body mechanics,Pain  Visit Diagnosis: Difficulty in walking, not elsewhere classified  Unsteadiness on feet  Acute pain of right knee     Problem List Patient Active Problem List   Diagnosis Date Noted  . Lichen sclerosus et atrophicus 01/30/2020  . Skin yeast infection 01/16/2020  . Vitiligo 01/16/2020  . Esophageal dysphagia 12/26/2019  . History of IBS 12/26/2019  . Carotid artery disease ([place]) 02/16/2017  . Elevated hemoglobin A1c 01/26/2017  . Well woman exam with routine gynecological exam 01/26/2017  . Anxiety 03/03/2016  . Elevated cholesterol 02/27/2015  . Dilated cbd, acquired 11/15/2013  .  GERD (gastroesophageal reflux disease) 11/15/2013  . Lung nodule 10/26/2013  . Mixed incontinence urge and stress (female)(female) 02/28/2013  . Cystocele 01/19/2013  . Hypothyroid 01/19/2013  . Asthma 01/18/2013  . Essential hypertension 01/18/2013    10:05 AM, 12/18/20 Jerene Pitch, DPT Physical Therapy with [facility]  [phone] office  [facility] [address] [place], [place], [address] Phone: [phone]   Fax:  [phone]  Name: April Lang MRN: [id number] Date of Birth: 1941/03/08

## 2020-12-20 ENCOUNTER — Encounter (HOSPITAL_COMMUNITY): Payer: Medicare Other

## 2020-12-23 ENCOUNTER — Encounter (HOSPITAL_COMMUNITY): Payer: Medicare Other

## 2020-12-25 ENCOUNTER — Encounter (HOSPITAL_COMMUNITY): Payer: Medicare Other

## 2020-12-27 ENCOUNTER — Encounter (HOSPITAL_COMMUNITY): Payer: Medicare Other

## 2020-12-30 ENCOUNTER — Encounter (HOSPITAL_COMMUNITY): Payer: Medicare Other

## 2021-01-01 ENCOUNTER — Encounter (HOSPITAL_COMMUNITY): Payer: Medicare Other

## 2021-01-03 ENCOUNTER — Encounter (HOSPITAL_COMMUNITY): Payer: Medicare Other

## 2021-01-27 ENCOUNTER — Other Ambulatory Visit (HOSPITAL_COMMUNITY): Payer: Self-pay | Admitting: Family Medicine

## 2021-01-27 DIAGNOSIS — E2839 Other primary ovarian failure: Secondary | ICD-10-CM

## 2021-01-30 ENCOUNTER — Ambulatory Visit (HOSPITAL_COMMUNITY)
Admission: RE | Admit: 2021-01-30 | Discharge: 2021-01-30 | Disposition: A | Discharge status: Home / Self Care | Payer: Medicare Other | Source: Ambulatory Visit | Attending: Family Medicine | Admitting: Family Medicine

## 2021-01-30 DIAGNOSIS — E2839 Other primary ovarian failure: Secondary | ICD-10-CM | POA: Insufficient documentation

## 2021-01-30 DIAGNOSIS — M810 Age-related osteoporosis without current pathological fracture: Secondary | ICD-10-CM | POA: Diagnosis not present

## 2021-01-30 DIAGNOSIS — Z780 Asymptomatic menopausal state: Secondary | ICD-10-CM | POA: Diagnosis not present

## 2021-02-04 DIAGNOSIS — E039 Hypothyroidism, unspecified: Secondary | ICD-10-CM | POA: Diagnosis not present

## 2021-02-04 DIAGNOSIS — Z1389 Encounter for screening for other disorder: Secondary | ICD-10-CM | POA: Diagnosis not present

## 2021-02-04 DIAGNOSIS — Z1331 Encounter for screening for depression: Secondary | ICD-10-CM | POA: Diagnosis not present

## 2021-02-04 DIAGNOSIS — E7849 Other hyperlipidemia: Secondary | ICD-10-CM | POA: Diagnosis not present

## 2021-02-04 DIAGNOSIS — I10 Essential (primary) hypertension: Secondary | ICD-10-CM | POA: Diagnosis not present

## 2021-02-04 DIAGNOSIS — Z6826 Body mass index (BMI) 26.0-26.9, adult: Secondary | ICD-10-CM | POA: Diagnosis not present

## 2021-02-04 DIAGNOSIS — E663 Overweight: Secondary | ICD-10-CM | POA: Diagnosis not present

## 2021-02-04 DIAGNOSIS — M1991 Primary osteoarthritis, unspecified site: Secondary | ICD-10-CM | POA: Diagnosis not present

## 2021-02-04 DIAGNOSIS — R7309 Other abnormal glucose: Secondary | ICD-10-CM | POA: Diagnosis not present

## 2021-02-04 DIAGNOSIS — F419 Anxiety disorder, unspecified: Secondary | ICD-10-CM | POA: Diagnosis not present

## 2021-02-19 DIAGNOSIS — M1712 Unilateral primary osteoarthritis, left knee: Secondary | ICD-10-CM | POA: Diagnosis not present

## 2021-02-19 DIAGNOSIS — M170 Bilateral primary osteoarthritis of knee: Secondary | ICD-10-CM | POA: Diagnosis not present

## 2021-02-19 DIAGNOSIS — Z4789 Encounter for other orthopedic aftercare: Secondary | ICD-10-CM | POA: Diagnosis not present

## 2021-02-19 DIAGNOSIS — M1711 Unilateral primary osteoarthritis, right knee: Secondary | ICD-10-CM | POA: Diagnosis not present

## 2021-03-13 DIAGNOSIS — Z20822 Contact with and (suspected) exposure to covid-19: Secondary | ICD-10-CM | POA: Diagnosis not present

## 2021-03-17 DIAGNOSIS — M25561 Pain in right knee: Secondary | ICD-10-CM | POA: Diagnosis not present

## 2021-03-17 DIAGNOSIS — M1711 Unilateral primary osteoarthritis, right knee: Secondary | ICD-10-CM | POA: Diagnosis not present

## 2021-03-19 DIAGNOSIS — M25561 Pain in right knee: Secondary | ICD-10-CM | POA: Diagnosis not present

## 2021-03-20 ENCOUNTER — Ambulatory Visit (HOSPITAL_COMMUNITY)
Admission: RE | Admit: 2021-03-20 | Discharge: 2021-03-20 | Disposition: A | Discharge status: Home / Self Care | Payer: Medicare Other | Source: Ambulatory Visit | Attending: Cardiovascular Disease | Admitting: Cardiovascular Disease

## 2021-03-20 ENCOUNTER — Other Ambulatory Visit: Payer: Self-pay

## 2021-03-20 ENCOUNTER — Other Ambulatory Visit (HOSPITAL_COMMUNITY): Payer: Self-pay | Admitting: Cardiovascular Disease

## 2021-03-20 DIAGNOSIS — I6522 Occlusion and stenosis of left carotid artery: Secondary | ICD-10-CM

## 2021-03-20 DIAGNOSIS — I6523 Occlusion and stenosis of bilateral carotid arteries: Secondary | ICD-10-CM | POA: Insufficient documentation

## 2021-03-31 DIAGNOSIS — M25561 Pain in right knee: Secondary | ICD-10-CM | POA: Diagnosis not present

## 2021-03-31 DIAGNOSIS — Z4789 Encounter for other orthopedic aftercare: Secondary | ICD-10-CM | POA: Diagnosis not present

## 2021-03-31 DIAGNOSIS — M1711 Unilateral primary osteoarthritis, right knee: Secondary | ICD-10-CM | POA: Diagnosis not present

## 2021-03-31 DIAGNOSIS — M179 Osteoarthritis of knee, unspecified: Secondary | ICD-10-CM | POA: Diagnosis not present

## 2021-04-07 ENCOUNTER — Other Ambulatory Visit: Payer: Self-pay

## 2021-04-07 ENCOUNTER — Ambulatory Visit
Admission: RE | Admit: 2021-04-07 | Discharge: 2021-04-07 | Disposition: A | Discharge status: Home / Self Care | Payer: Medicare Other | Source: Ambulatory Visit | Attending: Family Medicine | Admitting: Family Medicine

## 2021-04-07 DIAGNOSIS — R921 Mammographic calcification found on diagnostic imaging of breast: Secondary | ICD-10-CM

## 2021-04-07 DIAGNOSIS — R922 Inconclusive mammogram: Secondary | ICD-10-CM | POA: Diagnosis not present

## 2021-04-08 DIAGNOSIS — H02831 Dermatochalasis of right upper eyelid: Secondary | ICD-10-CM | POA: Diagnosis not present

## 2021-04-08 DIAGNOSIS — Z20822 Contact with and (suspected) exposure to covid-19: Secondary | ICD-10-CM | POA: Diagnosis not present

## 2021-04-08 DIAGNOSIS — H18413 Arcus senilis, bilateral: Secondary | ICD-10-CM | POA: Diagnosis not present

## 2021-04-08 DIAGNOSIS — H04123 Dry eye syndrome of bilateral lacrimal glands: Secondary | ICD-10-CM | POA: Diagnosis not present

## 2021-04-08 DIAGNOSIS — Z961 Presence of intraocular lens: Secondary | ICD-10-CM | POA: Diagnosis not present

## 2021-04-10 DIAGNOSIS — Z20822 Contact with and (suspected) exposure to covid-19: Secondary | ICD-10-CM | POA: Diagnosis not present

## 2021-06-02 ENCOUNTER — Ambulatory Visit (INDEPENDENT_AMBULATORY_CARE_PROVIDER_SITE_OTHER): Payer: Medicare Other | Admitting: Dermatology

## 2021-06-02 ENCOUNTER — Other Ambulatory Visit: Payer: Self-pay

## 2021-06-02 DIAGNOSIS — B079 Viral wart, unspecified: Secondary | ICD-10-CM

## 2021-06-02 DIAGNOSIS — L299 Pruritus, unspecified: Secondary | ICD-10-CM

## 2021-06-02 DIAGNOSIS — D3610 Benign neoplasm of peripheral nerves and autonomic nervous system, unspecified: Secondary | ICD-10-CM

## 2021-06-02 DIAGNOSIS — D3617 Benign neoplasm of peripheral nerves and autonomic nervous system of trunk, unspecified: Secondary | ICD-10-CM | POA: Diagnosis not present

## 2021-06-02 DIAGNOSIS — L821 Other seborrheic keratosis: Secondary | ICD-10-CM

## 2021-06-02 DIAGNOSIS — D1801 Hemangioma of skin and subcutaneous tissue: Secondary | ICD-10-CM

## 2021-06-02 DIAGNOSIS — I6522 Occlusion and stenosis of left carotid artery: Secondary | ICD-10-CM

## 2021-06-02 NOTE — Patient Instructions (Signed)
Pramoxine anti itch ingredient. Can be found in any OTC anti itch lotion.

## 2021-06-10 ENCOUNTER — Other Ambulatory Visit: Payer: Self-pay

## 2021-06-10 ENCOUNTER — Ambulatory Visit (INDEPENDENT_AMBULATORY_CARE_PROVIDER_SITE_OTHER): Payer: Medicare Other | Admitting: Cardiovascular Disease

## 2021-06-10 ENCOUNTER — Encounter: Payer: Self-pay | Admitting: Cardiovascular Disease

## 2021-06-10 DIAGNOSIS — I10 Essential (primary) hypertension: Secondary | ICD-10-CM

## 2021-06-10 DIAGNOSIS — E7800 Pure hypercholesterolemia, unspecified: Secondary | ICD-10-CM

## 2021-06-10 DIAGNOSIS — I6522 Occlusion and stenosis of left carotid artery: Secondary | ICD-10-CM | POA: Diagnosis not present

## 2021-06-10 LAB — HEPATIC FUNCTION PANEL
ALT: 17 IU/L (ref 0–32)
AST: 22 IU/L (ref 0–40)
Albumin: 4.8 g/dL — ABNORMAL HIGH (ref 3.6–4.6)
Alkaline Phosphatase: 74 IU/L (ref 44–121)
Bilirubin Total: 0.6 mg/dL (ref 0.0–1.2)
Bilirubin, Direct: 0.17 mg/dL (ref 0.00–0.40)
Total Protein: 6.7 g/dL (ref 6.0–8.5)

## 2021-06-10 LAB — LIPID PANEL
Chol/HDL Ratio: 3.3 ratio (ref 0.0–4.4)
Cholesterol, Total: 170 mg/dL (ref 100–199)
HDL: 51 mg/dL (ref >39)
LDL Chol Calc (NIH): 92 mg/dL (ref 0–99)
Triglycerides: 159 mg/dL — ABNORMAL HIGH (ref 0–149)
VLDL Cholesterol Cal: 27 mg/dL (ref 5–40)

## 2021-06-10 NOTE — Progress Notes (Signed)
06/10/2021 [name]   September 04, 1941  [id number]  Primary Physician Sharilyn Sites, MD Primary Cardiologist: Lorretta Harp MD Garret [place], [place], [place]  HPI:  April Lang is a 80 y.o.  Caucasian female who I last saw in the office 02/28/2020.  She had a MET test in the past  that was negative for ischemia, an echo that showed increased filling pressures. I began her on a diuretic to which she has had a variable response. She does have an ACE cough and was started on losartan instead. She denies chest pain or shortness of breath. Her primary care physician follows her lipid profile closely which was most recently performed 11/06/2019 revealing total cholesterol 216, LDL 127 and HDL 54 on pravastatin 10 mg a day.    We have been following her carotid Doppler studies which were performed in July of this year showing moderate left ICA stenosis.  She is neurologically asymptomatic.  Since I saw her a year ago she continues to do well.  She did have arthroscopic knee surgery Dr. Theda Sers in March of this year and is still recovering from this.  Apparently she may need a total knee replacement.  She denies chest pain or shortness of breath.    Current Meds  Medication Sig   acetaminophen (TYLENOL) 500 MG tablet Take 500 mg by mouth as needed for pain.   Clobetasol Prop Emollient Base 0.05 % emollient cream APPLY A THIN LAYER TWICE DAILY FOR 2 WEEKS; THEN 2 TO 3 TIMES A WEEK.   hydrochlorothiazide (HYDRODIURIL) 25 MG tablet TAKE ONE TABLET BY MOUTH DAILY.   levothyroxine (SYNTHROID, LEVOTHROID) 100 MCG tablet Take 100 mcg by mouth daily before breakfast.   losartan (COZAAR) 50 MG tablet TAKE ONE TABLET BY MOUTH DAILY.   pravastatin (PRAVACHOL) 20 MG tablet TAKE ONE TABLET BY MOUTH DAILY     Allergies  Allergen Reactions   Codeine     Almost passed out.    Lisinopril     Cough   Other Rash    Arthritis med-not sure of name    Social History   Socioeconomic History   Marital  status: Married    Spouse name: Not on file   Number of children: Not on file   Years of education: Not on file   Highest education level: Not on file  Occupational History   Not on file  Tobacco Use   Smoking status: Never   Smokeless tobacco: Never  Vaping Use   Vaping Use: Never used  Substance and Sexual Activity   Alcohol use: No   Drug use: No   Sexual activity: Not Currently    Birth control/protection: Surgical    Comment: hyst  Other Topics Concern   Not on file  Social History Narrative   Not on file   Social Determinants of Health   Financial Resource Strain: Not on file  Food Insecurity: Not on file  Transportation Needs: Not on file  Physical Activity: Not on file  Stress: Not on file  Social Connections: Not on file  Intimate Partner Violence: Not on file     Review of Systems: General: negative for chills, fever, night sweats or weight changes.  Cardiovascular: negative for chest pain, dyspnea on exertion, edema, orthopnea, palpitations, paroxysmal nocturnal dyspnea or shortness of breath Dermatological: negative for rash Respiratory: negative for cough or wheezing Urologic: negative for hematuria Abdominal: negative for nausea, vomiting, diarrhea, bright red blood per rectum, melena, or hematemesis  Neurologic: negative for visual changes, syncope, or dizziness All other systems reviewed and are otherwise negative except as noted above.    Blood pressure (!) 150/60, pulse (!) 55, height 5' 4"  (1.626 m), weight 155 lb (70.3 kg).  General appearance: alert and no distress Neck: no adenopathy, no carotid bruit, no JVD, supple, symmetrical, trachea midline, and thyroid not enlarged, symmetric, no tenderness/mass/nodules Lungs: clear to auscultation bilaterally Heart: regular rate and rhythm, S1, S2 normal, no murmur, click, rub or gallop Extremities: extremities normal, atraumatic, no cyanosis or edema Pulses: 2+ and symmetric Skin: Skin color, texture,  turgor normal. No rashes or lesions Neurologic: Grossly normal  EKG sinus bradycardia at 55 with left anterior fascicular block and LVH.  I personally reviewed this EKG.  ASSESSMENT AND PLAN:   Essential hypertension History of essential hypertension with blood pressure measured at 150/60.  She did not take her antihypertensive medications today which are HydroDIURIL and losartan.  Carotid artery disease ([place]) History of moderate left ICA stenosis by duplex ultrasound obtained 03/21/2021.  This will be repeated on an annual basis.  Elevated cholesterol History of hyperlipidemia on statin therapy.  We will recheck a lipid liver profile this morning.     Lorretta Harp MD FACP,FACC,FAHA, [facility] 06/10/2021 10:20 AM

## 2021-06-10 NOTE — Patient Instructions (Signed)
Medication Instructions:  Your physician recommends that you continue on your current medications as directed. Please refer to the Current Medication list given to you today.  *If you need a refill on your cardiac medications before your next appointment, please call your pharmacy*   Lab Work: Your physician recommends that you have labs drawn today: lipid/liver profile.   If you have labs (blood work) drawn today and your tests are completely normal, you will receive your results only by: [place] (if you have MyChart) OR A paper copy in the mail If you have any lab test that is abnormal or we need to change your treatment, we will call you to review the results.   Testing/Procedures: Your physician has requested that you have a carotid duplex. This test is an ultrasound of the carotid arteries in your neck. It looks at blood flow through these arteries that supply the brain with blood. Allow one hour for this exam. There are no restrictions or special instructions. To be done in July 2023. This procedure is done at [place].    Follow-Up: At [facility], you and your health needs are our priority.  As part of our continuing mission to provide you with exceptional heart care, we have created designated Provider Care Teams.  These Care Teams include your primary Cardiologist (physician) and Advanced Practice Providers (APPs -  Physician Assistants and Nurse Practitioners) who all work together to provide you with the care you need, when you need it.  We recommend signing up for the patient portal called "MyChart".  Sign up information is provided on this After Visit Summary.  MyChart is used to connect with patients for Virtual Visits (Telemedicine).  Patients are able to view lab/test results, encounter notes, upcoming appointments, etc.  Non-urgent messages can be sent to your provider as well.   To learn more about what you can do with MyChart, go to  [email or web address].    Your next appointment:   12 month(s)  The format for your next appointment:   In Person  Provider:   Quay Burow, MD

## 2021-06-10 NOTE — Assessment & Plan Note (Signed)
History of hyperlipidemia on statin therapy.  We will recheck a lipid liver profile this morning. 

## 2021-06-10 NOTE — Assessment & Plan Note (Signed)
History of essential hypertension with blood pressure measured at 150/60.  She did not take her antihypertensive medications today which are HydroDIURIL and losartan.

## 2021-06-10 NOTE — Assessment & Plan Note (Signed)
History of moderate left ICA stenosis by duplex ultrasound obtained 03/21/2021.  This will be repeated on an annual basis.

## 2021-06-12 DIAGNOSIS — Z23 Encounter for immunization: Secondary | ICD-10-CM | POA: Diagnosis not present

## 2021-06-17 ENCOUNTER — Encounter: Payer: Self-pay | Admitting: Dermatology

## 2021-06-17 NOTE — Progress Notes (Signed)
   Follow-Up Visit   Subjective  April Lang is a 80 y.o. female who presents for the following: Skin Problem (Here to check some lesions under breasts and on right abdomen. No history of skin cancers. ).  Multiple areas to check plus itching. Location:  Duration:  Quality:  Associated Signs/Symptoms: Modifying Factors:  Severity:  Timing: Context:   Objective  Well appearing patient in no apparent distress; mood and affect are within normal limits. Chest - Medial [facility]), Neck - Posterior Multiple 2 to 4 mm smooth dermal red papules  Left Inframammary Fold, Right Inframammary Fold Multiple 4 to 6 mm flattopped textured brown papules  Right Flank Soft pink partially compressible 6 mm dermal papule  Right Flank Verrucous 4 mm papule, possible wart  Left Abdomen (side) - Upper, Mid Back Itching without visible rash or growth compatible with neurogenic origin    A full examination was performed including scalp, head, eyes, ears, nose, lips, neck, chest, axillae, abdomen, back, buttocks, bilateral upper extremities, bilateral lower extremities, hands, feet, fingers, toes, fingernails, and toenails. All findings within normal limits unless otherwise noted below.  Areas beneath undergarments not fully examined.   Assessment & Plan    Hemangioma of skin (2) Neck - Posterior; Chest - Medial [facility])  No intervention indicated  Seborrheic keratosis (2) Left Inframammary Fold; Right Inframammary Fold  Leave if stable  Neurofibroma Right Flank  Historically stable, no indication for removal  Verruca Right Flank  Patient can decide if she wishes future removal.  Itching (2) Left Abdomen (side) - Upper; Mid Back  OTC Pramoxine anti itch lotion.      I, April Monarch, MD, have reviewed all documentation for this visit.  The documentation on 06/17/21 for the exam, diagnosis, procedures, and orders are all accurate and complete.

## 2021-06-20 ENCOUNTER — Telehealth: Payer: Self-pay

## 2021-06-20 DIAGNOSIS — E7800 Pure hypercholesterolemia, unspecified: Secondary | ICD-10-CM

## 2021-06-20 MED ORDER — PRAVASTATIN SODIUM 40 MG PO TABS: 40.0000 mg | ORAL_TABLET | Freq: Every day | ORAL | 1 refills | Status: DC

## 2021-06-20 NOTE — Telephone Encounter (Signed)
Spoke with pt regarding results of lipid/liver panel. Pt advised that per Dr. [name] we would increase pravastatin to 40mg  daily due to LDL being 92. Pt will repeat labs in 3 months. Pt verbalizes understanding. Prescription sent, lab orders placed.

## 2021-06-20 NOTE — Telephone Encounter (Signed)
-----   Message from Lorretta Harp, MD sent at 06/10/2021  3:52 PM EDT ----- Given carotid disease, not at goal for secondary prevention with an LDL of 92.  Increase pravastatin from 20 to 40 mg a day and recheck lipid liver profile in 3 months.

## 2021-06-26 DIAGNOSIS — Z23 Encounter for immunization: Secondary | ICD-10-CM | POA: Diagnosis not present

## 2021-07-15 DIAGNOSIS — J22 Unspecified acute lower respiratory infection: Secondary | ICD-10-CM | POA: Diagnosis not present

## 2021-07-16 ENCOUNTER — Other Ambulatory Visit: Payer: Self-pay | Admitting: Cardiovascular Disease

## 2021-07-22 DIAGNOSIS — F419 Anxiety disorder, unspecified: Secondary | ICD-10-CM | POA: Diagnosis not present

## 2021-07-22 DIAGNOSIS — E782 Mixed hyperlipidemia: Secondary | ICD-10-CM | POA: Diagnosis not present

## 2021-07-22 DIAGNOSIS — E663 Overweight: Secondary | ICD-10-CM | POA: Diagnosis not present

## 2021-07-22 DIAGNOSIS — I10 Essential (primary) hypertension: Secondary | ICD-10-CM | POA: Diagnosis not present

## 2021-07-22 DIAGNOSIS — R7309 Other abnormal glucose: Secondary | ICD-10-CM | POA: Diagnosis not present

## 2021-07-22 DIAGNOSIS — M1991 Primary osteoarthritis, unspecified site: Secondary | ICD-10-CM | POA: Diagnosis not present

## 2021-07-22 DIAGNOSIS — E039 Hypothyroidism, unspecified: Secondary | ICD-10-CM | POA: Diagnosis not present

## 2021-07-22 DIAGNOSIS — J22 Unspecified acute lower respiratory infection: Secondary | ICD-10-CM | POA: Diagnosis not present

## 2021-08-01 DIAGNOSIS — Z20822 Contact with and (suspected) exposure to covid-19: Secondary | ICD-10-CM | POA: Diagnosis not present

## 2021-08-07 DIAGNOSIS — Z20822 Contact with and (suspected) exposure to covid-19: Secondary | ICD-10-CM | POA: Diagnosis not present

## 2021-09-07 DIAGNOSIS — Z20822 Contact with and (suspected) exposure to covid-19: Secondary | ICD-10-CM | POA: Diagnosis not present

## 2021-09-10 ENCOUNTER — Other Ambulatory Visit: Payer: Self-pay

## 2021-09-10 DIAGNOSIS — E7800 Pure hypercholesterolemia, unspecified: Secondary | ICD-10-CM

## 2021-09-12 DIAGNOSIS — M1711 Unilateral primary osteoarthritis, right knee: Secondary | ICD-10-CM | POA: Diagnosis not present

## 2021-09-15 DIAGNOSIS — E7800 Pure hypercholesterolemia, unspecified: Secondary | ICD-10-CM | POA: Diagnosis not present

## 2021-09-16 LAB — HEPATIC FUNCTION PANEL
ALT: 10 IU/L (ref 0–32)
AST: 15 IU/L (ref 0–40)
Albumin: 4.8 g/dL — ABNORMAL HIGH (ref 3.6–4.6)
Alkaline Phosphatase: 73 IU/L (ref 44–121)
Bilirubin Total: 0.4 mg/dL (ref 0.0–1.2)
Bilirubin, Direct: <0.1 mg/dL (ref 0.00–0.40)
Total Protein: 6.5 g/dL (ref 6.0–8.5)

## 2021-09-16 LAB — LIPID PANEL
Chol/HDL Ratio: 3.1 ratio (ref 0.0–4.4)
Cholesterol, Total: 164 mg/dL (ref 100–199)
HDL: 53 mg/dL (ref >39)
LDL Chol Calc (NIH): 88 mg/dL (ref 0–99)
Triglycerides: 129 mg/dL (ref 0–149)
VLDL Cholesterol Cal: 23 mg/dL (ref 5–40)

## 2021-09-26 DIAGNOSIS — E039 Hypothyroidism, unspecified: Secondary | ICD-10-CM | POA: Diagnosis not present

## 2021-09-26 DIAGNOSIS — Z01818 Encounter for other preprocedural examination: Secondary | ICD-10-CM | POA: Diagnosis not present

## 2021-09-26 DIAGNOSIS — M1711 Unilateral primary osteoarthritis, right knee: Secondary | ICD-10-CM | POA: Diagnosis not present

## 2021-09-26 DIAGNOSIS — M1991 Primary osteoarthritis, unspecified site: Secondary | ICD-10-CM | POA: Diagnosis not present

## 2021-09-26 DIAGNOSIS — R7309 Other abnormal glucose: Secondary | ICD-10-CM | POA: Diagnosis not present

## 2021-09-26 DIAGNOSIS — I10 Essential (primary) hypertension: Secondary | ICD-10-CM | POA: Diagnosis not present

## 2021-09-26 DIAGNOSIS — F419 Anxiety disorder, unspecified: Secondary | ICD-10-CM | POA: Diagnosis not present

## 2021-09-26 DIAGNOSIS — Z1331 Encounter for screening for depression: Secondary | ICD-10-CM | POA: Diagnosis not present

## 2021-09-26 DIAGNOSIS — Z6826 Body mass index (BMI) 26.0-26.9, adult: Secondary | ICD-10-CM | POA: Diagnosis not present

## 2021-09-26 DIAGNOSIS — E782 Mixed hyperlipidemia: Secondary | ICD-10-CM | POA: Diagnosis not present

## 2021-10-16 ENCOUNTER — Other Ambulatory Visit: Payer: Self-pay | Admitting: Cardiovascular Disease

## 2021-10-20 DIAGNOSIS — Z20822 Contact with and (suspected) exposure to covid-19: Secondary | ICD-10-CM | POA: Diagnosis not present

## 2021-11-15 DIAGNOSIS — Z20828 Contact with and (suspected) exposure to other viral communicable diseases: Secondary | ICD-10-CM | POA: Diagnosis not present

## 2021-11-25 DIAGNOSIS — Z20822 Contact with and (suspected) exposure to covid-19: Secondary | ICD-10-CM | POA: Diagnosis not present

## 2021-12-11 DIAGNOSIS — Z20822 Contact with and (suspected) exposure to covid-19: Secondary | ICD-10-CM | POA: Diagnosis not present

## 2021-12-22 DIAGNOSIS — Z20822 Contact with and (suspected) exposure to covid-19: Secondary | ICD-10-CM | POA: Diagnosis not present

## 2021-12-29 DIAGNOSIS — R051 Acute cough: Secondary | ICD-10-CM | POA: Diagnosis not present

## 2021-12-29 DIAGNOSIS — Z20822 Contact with and (suspected) exposure to covid-19: Secondary | ICD-10-CM | POA: Diagnosis not present

## 2021-12-29 DIAGNOSIS — R059 Cough, unspecified: Secondary | ICD-10-CM | POA: Diagnosis not present

## 2021-12-30 NOTE — H&P (Addendum)
TOTAL KNEE ADMISSION H&P ? ?Patient is being admitted for right total knee arthroplasty. ? ?Subjective: ? ?Chief Complaint: Right knee pain. ? ?HPI: April Lang, 81 y.o. female has a history of pain and functional disability in the right knee due to arthritis and has failed non-surgical conservative treatments for greater than 12 weeks to include NSAID's and/or analgesics and activity modification. Onset of symptoms was gradual, starting  several  years ago with gradually worsening course since that time. The patient noted prior procedures on the knee to include  arthroscopy and menisectomy on the right knee.  Patient currently rates pain in the right knee at 8 out of 10 with activity. Patient has night pain, worsening of pain with activity and weight bearing, pain with passive range of motion, and crepitus. Patient has evidence of  bone-on-bone arthritis centrally and in the lateral compartment with some tibial subluxation  by imaging studies. There is no active infection. ? ?Patient Active Problem List  ? Diagnosis Date Noted  ? Lichen sclerosus et atrophicus 01/30/2020  ? Skin yeast infection 01/16/2020  ? Vitiligo 01/16/2020  ? Esophageal dysphagia 12/26/2019  ? History of IBS 12/26/2019  ? Carotid artery disease ([place]) 02/16/2017  ? Elevated hemoglobin A1c 01/26/2017  ? Well woman exam with routine gynecological exam 01/26/2017  ? Anxiety 03/03/2016  ? Elevated cholesterol 02/27/2015  ? Dilated cbd, acquired 11/15/2013  ? GERD (gastroesophageal reflux disease) 11/15/2013  ? Lung nodule 10/26/2013  ? Mixed incontinence urge and stress (female)(female) 02/28/2013  ? Cystocele 01/19/2013  ? Hypothyroid 01/19/2013  ? Asthma 01/18/2013  ? Essential hypertension 01/18/2013  ? ? ?Past Medical History:  ?Diagnosis Date  ? Arthritis   ? Asthma   ? COPD (chronic obstructive pulmonary disease) ([place])   ? Cystocele 01/19/2013  ? Hypertension   ? Hypothyroidism   ? Pre-diabetes   ? Stress incontinence 01/19/2013  ? Thyroid  disease   ? ? ?Past Surgical History:  ?Procedure Laterality Date  ? ABDOMINAL HYSTERECTOMY    ? breast biopsy Left 04/05/2020  ? BREAST BIOPSY Left 04/05/2020  ? CATARACT EXTRACTION    ? CHOLECYSTECTOMY    ? DILATION AND CURETTAGE OF UTERUS    ? HAND SURGERY Right   ? screws put in fingers  ? TUBAL LIGATION    ? WRIST SURGERY Left   ? ? ?Prior to Admission medications   ?Medication Sig Start Date End Date Taking? Authorizing Provider  ?acetaminophen (TYLENOL) 500 MG tablet Take 500 mg by mouth as needed for pain.    [provider]  ?Clobetasol Prop Emollient Base 0.05 % emollient cream APPLY A THIN LAYER TWICE DAILY FOR 2 WEEKS; THEN 2 TO 3 TIMES A WEEK. 10/14/20   Estill Dooms, NP  ?hydrochlorothiazide (HYDRODIURIL) 25 MG tablet TAKE ONE TABLET BY MOUTH DAILY. 07/17/21   Lorretta Harp, MD  ?levothyroxine (SYNTHROID, LEVOTHROID) 100 MCG tablet Take 100 mcg by mouth daily before breakfast.    [provider]  ?losartan (COZAAR) 50 MG tablet TAKE ONE TABLET BY MOUTH DAILY. 07/17/21   Lorretta Harp, MD  ?pravastatin (PRAVACHOL) 40 MG tablet TAKE 1 TABLET BY MOUTH DAILY 10/17/21   Lorretta Harp, MD  ? ? ?Allergies  ?Allergen Reactions  ? Codeine   ?  Almost passed out.   ? Lisinopril   ?  Cough  ? Other Rash  ?  Arthritis med-not sure of name  ? ? ?Social History  ? ?Socioeconomic History  ? Marital  status: Married  ?  Spouse name: Not on file  ? Number of children: Not on file  ? Years of education: Not on file  ? Highest education level: Not on file  ?Occupational History  ? Not on file  ?Tobacco Use  ? Smoking status: Never  ? Smokeless tobacco: Never  ?Vaping Use  ? Vaping Use: Never used  ?Substance and Sexual Activity  ? Alcohol use: No  ? Drug use: No  ? Sexual activity: Not Currently  ?  Birth control/protection: Surgical  ?  Comment: hyst  ?Other Topics Concern  ? Not on file  ?Social History Narrative  ? Not on file  ? ?Social Determinants of Health  ? ?Financial Resource  Strain: Not on file  ?Food Insecurity: Not on file  ?Transportation Needs: Not on file  ?Physical Activity: Not on file  ?Stress: Not on file  ?Social Connections: Not on file  ?Intimate Partner Violence: Not on file  ? ? ?Tobacco Use: Low Risk   ? Smoking Tobacco Use: Never  ? Smokeless Tobacco Use: Never  ? Passive Exposure: Not on file  ? ?Social History  ? ?Substance and Sexual Activity  ?Alcohol Use No  ? ? ?Family History  ?Problem Relation Age of Onset  ? Cancer Mother   ?     breast  ? Dementia Mother   ? Breast cancer Mother   ? Coronary artery disease Father   ? Cancer Maternal Aunt   ?     breast  ? Breast cancer Maternal Aunt   ? Cancer Maternal Grandmother   ?     breast  ? Breast cancer Maternal Grandmother   ? Kidney disease Paternal Grandmother   ? ? ?Review of Systems  ?Constitutional:  Negative for chills and fever.  ?HENT:  Negative for congestion, sore throat and tinnitus.   ?Eyes:  Negative for double vision, photophobia and pain.  ?Respiratory:  Negative for cough, shortness of breath and wheezing.   ?Cardiovascular:  Negative for chest pain, palpitations and orthopnea.  ?Gastrointestinal:  Negative for heartburn, nausea and vomiting.  ?Genitourinary:  Negative for dysuria, frequency and urgency.  ?Musculoskeletal:  Positive for joint pain.  ?Neurological:  Negative for dizziness, weakness and headaches.  ? ?Objective: ? ?Physical Exam: ?Well nourished and well developed.  ?General: Alert and oriented x3, cooperative and pleasant, no acute distress.  ?Head: normocephalic, atraumatic, neck supple.  ?Eyes: EOMI.  ?Musculoskeletal: ? ? Right Knee Exam:  ? Large effusion.  ? Range of motion: 15 to 120 degrees.  ? Marked crepitus on range of motion of the knee.  ? Positive medial joint line tenderness.  ? No lateral joint line tenderness.  ? Stable knee. ? ?Calves soft and nontender. Motor function intact in LE. Strength 5/5 LE bilaterally. ?Neuro: Distal pulses 2+. Sensation to light touch intact  in LE. ? ?Imaging Review ?Plain radiographs demonstrate severe degenerative joint disease of the right knee. The overall alignment is mild varus. The bone quality appears to be adequate for age and reported activity level. ? ?Assessment/Plan: ? ?End stage arthritis, right knee  ? ?The patient history, physical examination, clinical judgment of the provider and imaging studies are consistent with end stage degenerative joint disease of the right knee and total knee arthroplasty is deemed medically necessary. The treatment options including medical management, injection therapy arthroscopy and arthroplasty were discussed at length. The risks and benefits of total knee arthroplasty were presented and reviewed. The risks due to aseptic  loosening, infection, stiffness, patella tracking problems, thromboembolic complications and other imponderables were discussed. The patient acknowledged the explanation, agreed to proceed with the plan and consent was signed. Patient is being admitted for inpatient treatment for surgery, pain control, PT, OT, prophylactic antibiotics, VTE prophylaxis, progressive ambulation and ADLs and discharge planning. The patient is planning to be discharged  home . ? ? ?Patient's anticipated LOS is less than 2 midnights, meeting these requirements: ?- Lives within 1 hour of care ?- Has a competent adult at home to recover with post-op recover ?- NO history of ? - Chronic pain requiring opioids ? - Diabetes ? - Coronary Artery Disease ? - Heart failure ? - Heart attack ? - Stroke ? - DVT/VTE ? - Cardiac arrhythmia ? - Respiratory Failure ? - Renal failure ? - Anemia ? - Advanced Liver disease ? ?Therapy Plans: Outpatient therapy at [place] ?Disposition: Home with husband and sister ?Planned DVT Prophylaxis: Aspirin 325 mg BID ?DME Needed: None ?PCP: Sharilyn Sites, MD (clearance received) ?Cardiologist: Quay Burow, MD  (clearance received) ?TXA: IV ?Allergies: Lisinopril (cough),  codeine (dizziness) ?Anesthesia Concerns: None ?BMI: 26.1 ?Last HgbA1c: Not diabetic.  ?Pharmacy: [facility] ? ?Other: ?- Tolerated oxycodone with previous knee arthroscopy ? ?- Patient was instructed

## 2021-12-31 ENCOUNTER — Telehealth: Payer: Self-pay | Admitting: *Deleted

## 2021-12-31 ENCOUNTER — Telehealth: Payer: Self-pay

## 2021-12-31 DIAGNOSIS — Z20822 Contact with and (suspected) exposure to covid-19: Secondary | ICD-10-CM | POA: Diagnosis not present

## 2021-12-31 NOTE — Telephone Encounter (Signed)
Pt agreeable to tel pre op appt 01/01/22 @ 9 am. Med rec and consent are done.. ? ?  ?Patient Consent for Virtual Visit  ? ? ?   ? ?April Lang has provided verbal consent on 12/31/2021 for a virtual visit (video or telephone). ? ? ?CONSENT FOR VIRTUAL VISIT FOR:  April Lang  ?By participating in this virtual visit I agree to the following: ? ?I hereby voluntarily request, consent and authorize [facility] and its employed or contracted physicians, physician assistants, nurse practitioners or other licensed health care professionals (the Practitioner), to provide me with telemedicine health care services (the ?Services") as deemed necessary by the treating Practitioner. I acknowledge and consent to receive the Services by the Practitioner via telemedicine. I understand that the telemedicine visit will involve communicating with the Practitioner through live audiovisual communication technology and the disclosure of certain medical information by electronic transmission. I acknowledge that I have been given the opportunity to request an in-person assessment or other available alternative prior to the telemedicine visit and am voluntarily participating in the telemedicine visit. ? ?I understand that I have the right to withhold or withdraw my consent to the use of telemedicine in the course of my care at any time, without affecting my right to future care or treatment, and that the Practitioner or I may terminate the telemedicine visit at any time. I understand that I have the right to inspect all information obtained and/or recorded in the course of the telemedicine visit and may receive copies of available information for a reasonable fee.  I understand that some of the potential risks of receiving the Services via telemedicine include:  ?Delay or interruption in medical evaluation due to technological equipment failure or disruption; ?Information transmitted may not be sufficient (e.g. poor resolution of  images) to allow for appropriate medical decision making by the Practitioner; and/or  ?In rare instances, security protocols could fail, causing a breach of personal health information. ? ?Furthermore, I acknowledge that it is my responsibility to provide information about my medical history, conditions and care that is complete and accurate to the best of my ability. I acknowledge that Practitioner's advice, recommendations, and/or decision may be based on factors not within their control, such as incomplete or inaccurate data provided by me or distortions of diagnostic images or specimens that may result from electronic transmissions. I understand that the practice of medicine is not an exact science and that Practitioner makes no warranties or guarantees regarding treatment outcomes. I acknowledge that a copy of this consent can be made available to me via my patient portal ([facility]), or I can request a printed copy by calling the office of [facility].   ? ?I understand that my insurance will be billed for this visit.  ? ?I have read or had this consent read to me. ?I understand the contents of this consent, which adequately explains the benefits and risks of the Services being provided via telemedicine.  ?I have been provided ample opportunity to ask questions regarding this consent and the Services and have had my questions answered to my satisfaction. ?I give my informed consent for the services to be provided through the use of telemedicine in my medical care ? ? ? ?

## 2021-12-31 NOTE — Telephone Encounter (Signed)
Pt agreeable to tel pre op appt 01/01/22 @ 9 am. Med rec and consent are done.Marland Kitchen ?

## 2021-12-31 NOTE — Telephone Encounter (Signed)
? ?  Pre-operative Risk Assessment  ?  ?Patient Name: April Lang  ?DOB: 08-08-1941 ?MRN: [id number]  ? ?  ? ?Request for Surgical Clearance   ? ?Procedure:   Right total knee arthroplasty ? ?Date of Surgery:  Clearance 01/26/22                              ?   ?Surgeon:  Dr. Pilar Plate Aluisio  ?Surgeon's Group or Practice Name:  [facility] ?Phone number:  [phone] ?Fax number:  [phone] ?  ?Type of Clearance Requested:   ?- Medical  ?  ?Type of Anesthesia:  General  ?  ?Additional requests/questions:  Please fax a copy of clearance to the surgeon's office. ? ?Signed, ?Abner Greenspan L   ?12/31/2021, 8:16 AM   ?

## 2021-12-31 NOTE — Progress Notes (Signed)
? ?Virtual Visit via Telephone Note  ? ?This visit type was conducted due to national recommendations for restrictions regarding the COVID-19 Pandemic (e.g. social distancing) in an effort to limit this patient's exposure and mitigate transmission in our community.  Due to her co-morbid illnesses, this patient is at least at moderate risk for complications without adequate follow up.  This format is felt to be most appropriate for this patient at this time.  The patient did not have access to video technology/had technical difficulties with video requiring transitioning to audio format only (telephone).  All issues noted in this document were discussed and addressed.  No physical exam could be performed with this format.  Please refer to the patient's chart for her  consent to telehealth for [facility]. ? ?Evaluation Performed:  Preoperative cardiovascular risk assessment ?_____________  ? ?Date:  12/31/2021  ? ?Patient ID:  April, Lang August 17, 1941, MRN [id number] ?Patient Location:  ?Home ?Provider location:   ?Office ? ?Primary Care Provider:  Sharilyn Sites, MD ?Primary Cardiologist:  [name] ? ?Chief Complaint  ?  ?81 y.o. y/o female with a h/o left carotid artery stenosis, HTN, HLD, COPD, and hypothyroidism who is pending right TKA scheduled for 01/26/2022, and presents today for telephonic preoperative cardiovascular risk assessment. ? ?Past Medical History  ?  ?Past Medical History:  ?Diagnosis Date  ? Arthritis   ? Asthma   ? COPD (chronic obstructive pulmonary disease) ([place])   ? Cystocele 01/19/2013  ? Hypertension   ? Hypothyroidism   ? Pre-diabetes   ? Stress incontinence 01/19/2013  ? Thyroid disease   ? ?Past Surgical History:  ?Procedure Laterality Date  ? ABDOMINAL HYSTERECTOMY    ? breast biopsy Left 04/05/2020  ? BREAST BIOPSY Left 04/05/2020  ? CATARACT EXTRACTION    ? CHOLECYSTECTOMY    ? DILATION AND CURETTAGE OF UTERUS    ? HAND SURGERY Right   ? screws put in fingers  ? TUBAL LIGATION    ?  WRIST SURGERY Left   ? ? ?Allergies ? ?Allergies  ?Allergen Reactions  ? Codeine   ?  Almost passed out.   ? Lisinopril   ?  Cough  ? Other Rash  ?  Arthritis med-not sure of name  ? ? ?History of Present Illness  ?  ?April Lang is a 81 y.o. female who presents via audio/video conferencing for a telehealth visit today.  Pt was last seen in cardiology clinic on 06/20/2021 by Dr. [name] for follow up of carotid artery stenosis.  At that time April Lang was doing well, without angina or decompensation.  Most recent carotid artery ultrasound from 03/2021 showed minimal RICA stenosis with stable 97-67% LICA stenosis.  She has been medically managed.  The patient is now pending right TKA, which is scheduled for 01/26/2022.  Since her last visit, she has done well from a cardiac perspective and has bene without symptoms of angina.  She [facility] to [facility] regularly for exercise without cardiac limitation. She is able to achieve > 4 METs without cardiac limitation. She did recently have a bug bite or poison oak and took some antihistamine which was associated with some dizziness, which is typical for her with those types of medications. Since stopping the medication, she has been asymptomatic.  ? ? ?Home Medications  ?  ?Prior to Admission medications   ?Medication Sig Start Date End Date Taking? Authorizing Provider  ?acetaminophen (TYLENOL) 500 MG tablet Take 500 mg by mouth  as needed for pain.    [provider]  ?Clobetasol Prop Emollient Base 0.05 % emollient cream APPLY A THIN LAYER TWICE DAILY FOR 2 WEEKS; THEN 2 TO 3 TIMES A WEEK. ?Patient not taking: Reported on 12/31/2021 10/14/20   Derrek Monaco A, NP  ?hydrochlorothiazide (HYDRODIURIL) 25 MG tablet TAKE ONE TABLET BY MOUTH DAILY. 07/17/21   Lorretta Harp, MD  ?levothyroxine (SYNTHROID, LEVOTHROID) 100 MCG tablet Take 100 mcg by mouth daily before breakfast.    [provider]  ?losartan (COZAAR) 50 MG tablet TAKE ONE TABLET BY MOUTH  DAILY. 07/17/21   Lorretta Harp, MD  ?pravastatin (PRAVACHOL) 40 MG tablet TAKE 1 TABLET BY MOUTH DAILY 10/17/21   Lorretta Harp, MD  ? ? ?Physical Exam  ?  ?Vital Signs:  April Lang does not have vital signs available for review today. ? ?Given telephonic nature of communication, physical exam is limited. ?AAOx3. NAD. Normal affect.  Speech and respirations are unlabored. ? ?Accessory Clinical Findings  ?  ?None ? ?Assessment & Plan  ?  ?1.  Preoperative Cardiovascular Risk Assessment: The patient affirms she has been doing well without any new cardiac symptoms. They are able to achieve > 4 METS without cardiac limitations. Per [employer], she would be low risk for noncardiac surgery.  Therefore, based on ACC/AHA guidelines, the patient would be at acceptable risk for the planned procedure without further cardiovascular testing. The patient was advised that if she develops new symptoms prior to surgery to contact our office to arrange for a follow-up visit, and she verbalized understanding. ? ? ?A copy of this note will be routed to requesting surgeon. ? ?Time:   ?Today, I have spent 5 minutes with the patient with telehealth technology discussing medical history, symptoms, and management plan.   ? ? ?Christell Faith, PA-C ? ?12/31/2021, 6:19 PM ? ?

## 2021-12-31 NOTE — Telephone Encounter (Signed)
Preoperative team, please contact this patient and set up a phone call appointment for further preoperative risk assessment. Please obtain consent and complete medication review. Thank you for your help.  ?

## 2022-01-01 ENCOUNTER — Ambulatory Visit (INDEPENDENT_AMBULATORY_CARE_PROVIDER_SITE_OTHER): Payer: Medicare Other | Admitting: Physician Assistant

## 2022-01-01 DIAGNOSIS — Z01810 Encounter for preprocedural cardiovascular examination: Secondary | ICD-10-CM | POA: Diagnosis not present

## 2022-01-10 DIAGNOSIS — Z20822 Contact with and (suspected) exposure to covid-19: Secondary | ICD-10-CM | POA: Diagnosis not present

## 2022-01-12 ENCOUNTER — Other Ambulatory Visit: Payer: Self-pay | Admitting: Cardiovascular Disease

## 2022-01-12 DIAGNOSIS — Z20822 Contact with and (suspected) exposure to covid-19: Secondary | ICD-10-CM | POA: Diagnosis not present

## 2022-01-12 NOTE — Patient Instructions (Addendum)
DUE TO COVID-19 ONLY ONE VISITOR IS ALLOWED TO COME WITH YOU AND STAY IN THE WAITING ROOM ONLY DURING PRE OP AND PROCEDURE.   ?**NO VISITORS ARE ALLOWED IN THE SHORT STAY AREA OR RECOVERY ROOM!!** ? ?IF YOU WILL BE ADMITTED INTO THE HOSPITAL YOU ARE ALLOWED ONLY TWO SUPPORT PEOPLE DURING VISITATION HOURS ONLY (10AM -8PM)   ?The support person(s) may change daily. ?The support person(s) must pass our screening, gel in and out, and wear a mask at all times, including in the patient?s room. ?Patients must also wear a mask when staff or their support person are in the room. ? ?No visitors under the age of 37. Any visitor under the age of 20 must be accompanied by an adult.  ?     ? Your procedure is scheduled on: 01/26/22 ? ? Report to [facility] Main Entrance ? ?  Report to admitting at: 6:45 AM ? ? Call this number if you have problems the morning of surgery [phone] ? ? Do not eat food :After Midnight. ? ? May have liquids until : 6:00 AM   day of surgery ? ?CLEAR LIQUID DIET ? ?Foods Allowed                                                                     Foods Excluded ? ?Water, Black Coffee and tea, regular and decaf                             liquids that you cannot  ?Plain Jell-O in any flavor  (No red)                                           see through such as: ?Fruit ices (not with fruit pulp)                                     milk, soups, orange juice  ?            Iced Popsicles (No red)                                    All solid food                                   ?Apple juices ?Sports drinks like Gatorade (No red) ?Lightly seasoned clear broth or consume(fat free) ?Sugar, honey syrup ? ?Sample Menu ?Breakfast                                Lunch                                     Supper ?Cranberry juice  Beef broth                            Chicken broth ?Jell-O                                     Grape juice                           Apple juice ?Coffee or  tea                        Jell-O                                      Popsicle ?                                               Coffee or tea                        Coffee or tea  ?  ? ? Complete one Ensure drink the morning of surgery at : 6:00 AM  the day of surgery. ?  ?The day of surgery:  ?Drink ONE (1) Pre-Surgery Clear Ensure or G2 by am the morning of surgery. Drink in one sitting. Do not sip.  ?This drink was given to you during your hospital  ?pre-op appointment visit. ?Nothing else to drink after completing the  ?Pre-Surgery Clear Ensure or G2. ?  ?       If you have questions, please contact your surgeon?s office. ?  ?  ?Oral Hygiene is also important to reduce your risk of infection.                                    ?Remember - BRUSH YOUR TEETH THE MORNING OF SURGERY WITH YOUR REGULAR TOOTHPASTE ? ? Do NOT smoke after Midnight ? ? Take these medicines the morning of surgery with A SIP OF WATER: synthroid.Tylenol as needed.Eye drops as usual. ? ?DO NOT TAKE ANY ORAL DIABETIC MEDICATIONS DAY OF YOUR SURGERY ?                  ?           You may not have any metal on your body including hair pins, jewelry, and body piercing ? ?           Do not wear make-up, lotions, powders, perfumes/cologne, or deodorant ? ?Do not wear nail polish including gel and S&S, artificial/acrylic nails, or any other type of covering on natural nails including finger and toenails. If you have artificial nails, gel coating, etc. that needs to be removed by a nail salon please have this removed prior to surgery or surgery may need to be canceled/ delayed if the surgeon/ anesthesia feels like they are unable to be safely monitored.  ? ?Do not shave  48 hours prior to surgery. ? ? Do not bring valuables to the hospital. [facility] NOT ?  RESPONSIBLE   FOR VALUABLES. ? ? Contacts, dentures or bridgework may not be worn into surgery. ? ? Bring small overnight bag day of surgery. ?  ? Patients discharged on the day of  surgery will not be allowed to drive home. ? ? Special Instructions: Bring a copy of your healthcare power of attorney and living will documents         the day of surgery if you haven't scanned them before. ? ?            Please read over the following fact sheets you were given: IF [place] [phone] ? ?Incentive Spirometer ? ?An incentive spirometer is a tool that can help keep your lungs clear and active. This tool measures how well you are filling your lungs with each breath. Taking long deep breaths may help reverse or decrease the chance of developing breathing (pulmonary) problems (especially infection) following: ?A long period of time when you are unable to move or be active. ?BEFORE THE PROCEDURE  ?If the spirometer includes an indicator to show your best effort, your nurse or respiratory therapist will set it to a desired goal. ?If possible, sit up straight or lean slightly forward. Try not to slouch. ?Hold the incentive spirometer in an upright position. ?INSTRUCTIONS FOR USE  ?Sit on the edge of your bed if possible, or sit up as far as you can in bed or on a chair. ?Hold the incentive spirometer in an upright position. ?Breathe out normally. ?Place the mouthpiece in your mouth and seal your lips tightly around it. ?Breathe in slowly and as deeply as possible, raising the piston or the ball toward the top of the column. ?Hold your breath for 3-5 seconds or for as long as possible. Allow the piston or ball to fall to the bottom of the column. ?Remove the mouthpiece from your mouth and breathe out normally. ?Rest for a few seconds and repeat Steps 1 through 7 at least 10 times every 1-2 hours when you are awake. Take your time and take a few normal breaths between deep breaths. ?The spirometer may include an indicator to show your best effort. Use the indicator as a goal to work toward during each repetition. ?After each set of 10 deep breaths,  practice coughing to be sure your lungs are clear. If you have an incision (the cut made at the time of surgery), support your incision when coughing by placing a pillow or rolled up towels firmly against it. ?Once you are able to get out of bed, walk around indoors and cough well. You may stop using the incentive spirometer when instructed by your caregiver.  ?RISKS AND COMPLICATIONS ?Take your time so you do not get dizzy or light-headed. ?If you are in pain, you may need to take or ask for pain medication before doing incentive spirometry. It is harder to take a deep breath if you are having pain. ?AFTER USE ?Rest and breathe slowly and easily. ?It can be helpful to keep track of a log of your progress. Your caregiver can provide you with a simple table to help with this. ?If you are using the spirometer at home, follow these instructions: ?SEEK MEDICAL CARE IF:  ?You are having difficultly using the spirometer. ?You have trouble using the spirometer as often as instructed. ?Your pain medication is not giving enough relief while using the spirometer. ?You develop fever of 100.5? F (38.1? C) or higher. ?SEEK IMMEDIATE  MEDICAL CARE IF:  ?You cough up bloody sputum that had not been present before. ?You develop fever of 102? F (38.9? C) or greater. ?You develop worsening pain at or near the incision site. ?MAKE SURE YOU:  ?Understand these instructions. ?Will watch your condition. ?Will get help right away if you are not doing well or get worse. ?Document Released: 01/04/2007 Document Revised: 11/16/2011 Document Reviewed: 03/07/2007 ?ExitCare? Patient Information ?2014 [place], [place]. ? ? ?________________________________________________________________________  ? ?                          ?                                   - Preparing for Surgery ?Before surgery, you can play an important role.  Because skin is not sterile, your skin needs to be as free of germs as possible.  You can reduce the number  of germs on your skin by washing with CHG (chlorahexidine gluconate) soap before surgery.  CHG is an antiseptic cleaner which kills germs and bonds with the skin to continue killing germs even after washing

## 2022-01-13 ENCOUNTER — Other Ambulatory Visit: Payer: Self-pay

## 2022-01-13 ENCOUNTER — Encounter (HOSPITAL_COMMUNITY): Payer: Self-pay

## 2022-01-13 ENCOUNTER — Encounter (HOSPITAL_COMMUNITY)
Admission: RE | Admit: 2022-01-13 | Discharge: 2022-01-13 | Disposition: A | Discharge status: Home / Self Care | Payer: Medicare Other | Source: Ambulatory Visit | Attending: Orthopedic Surgery | Admitting: Orthopedic Surgery

## 2022-01-13 VITALS — BP 163/63 | HR 62 | Temp 98.3°F | Ht 64.0 in | Wt 150.0 lb

## 2022-01-13 DIAGNOSIS — R7303 Prediabetes: Secondary | ICD-10-CM | POA: Diagnosis not present

## 2022-01-13 DIAGNOSIS — Z01812 Encounter for preprocedural laboratory examination: Secondary | ICD-10-CM | POA: Insufficient documentation

## 2022-01-13 DIAGNOSIS — M1711 Unilateral primary osteoarthritis, right knee: Secondary | ICD-10-CM | POA: Diagnosis not present

## 2022-01-13 DIAGNOSIS — Z01818 Encounter for other preprocedural examination: Secondary | ICD-10-CM

## 2022-01-13 HISTORY — DX: Atherosclerotic heart disease of native coronary artery without angina pectoris: I25.10

## 2022-01-13 LAB — CBC
HCT: 41 % (ref 36.0–46.0)
Hemoglobin: 13.6 g/dL (ref 12.0–15.0)
MCH: 32.6 pg (ref 26.0–34.0)
MCHC: 33.2 g/dL (ref 30.0–36.0)
MCV: 98.3 fL (ref 80.0–100.0)
Platelets: 336 10*3/uL (ref 150–400)
RBC: 4.17 MIL/uL (ref 3.87–5.11)
RDW: 12.5 % (ref 11.5–15.5)
WBC: 8.6 10*3/uL (ref 4.0–10.5)
nRBC: 0 % (ref 0.0–0.2)

## 2022-01-13 LAB — COMPREHENSIVE METABOLIC PANEL
ALT: 18 U/L (ref 0–44)
AST: 21 U/L (ref 15–41)
Albumin: 4.6 g/dL (ref 3.5–5.0)
Alkaline Phosphatase: 64 U/L (ref 38–126)
Anion gap: 11 (ref 5–15)
BUN: 19 mg/dL (ref 8–23)
CO2: 26 mmol/L (ref 22–32)
Calcium: 9.6 mg/dL (ref 8.9–10.3)
Chloride: 101 mmol/L (ref 98–111)
Creatinine, Ser: 0.87 mg/dL (ref 0.44–1.00)
GFR, Estimated: >60 mL/min (ref >60)
Glucose, Bld: 119 mg/dL — ABNORMAL HIGH (ref 70–99)
Potassium: 4.1 mmol/L (ref 3.5–5.1)
Sodium: 138 mmol/L (ref 135–145)
Total Bilirubin: 1 mg/dL (ref 0.3–1.2)
Total Protein: 7.4 g/dL (ref 6.5–8.1)

## 2022-01-13 LAB — SURGICAL PCR SCREEN
MRSA, PCR: NEGATIVE
Staphylococcus aureus: NEGATIVE

## 2022-01-13 LAB — GLUCOSE, CAPILLARY: Glucose-Capillary: 112 mg/dL — ABNORMAL HIGH (ref 70–99)

## 2022-01-13 LAB — HEMOGLOBIN A1C
Hgb A1c MFr Bld: 5.6 % (ref 4.8–5.6)
Mean Plasma Glucose: 114.02 mg/dL

## 2022-01-13 NOTE — Progress Notes (Signed)
For Short Stay: ?[place] appointment date: ?Date of COVID positive in last 90 days: ? ?Bowel Prep reminder: ? ? ?For Anesthesia: ?PCP - Dr. Al Pimple: Clearance: Chart. ?Cardiologist - Dr. Quay Burow. LOV: 02/28/20. ? ?Chest x-ray -  ?EKG - 06/10/21 ?Stress Test -  ?ECHO -  ?Cardiac Cath -  ?Pacemaker/ICD device last checked: ?Pacemaker orders received: ?Device Rep notified: ? ?Spinal Cord Stimulator: ? ?Sleep Study -  ?CPAP -  ? ?Fasting Blood Sugar -  ?Checks Blood Sugar _____ times a day ?Date and result of last Hgb A1c- ? ?Blood Thinner Instructions: ?Aspirin Instructions: ?Last Dose: ? ?Activity level: Can go up a flight of stairs and activities of daily living without stopping and without chest pain and/or shortness of breath ?  Able to exercise without chest pain and/or shortness of breath ?  Unable to go up a flight of stairs without chest pain and/or shortness of breath ?   ? ?Anesthesia review: Hx: CAD,HTN,COPD,Pre-DIA. ? ?Patient denies shortness of breath, fever, cough and chest pain at PAT appointment ? ? ?Patient verbalized understanding of instructions that were given to them at the PAT appointment. Patient was also instructed that they will need to review over the PAT instructions again at home before surgery.  ?

## 2022-01-15 NOTE — Progress Notes (Signed)
Anesthesia Chart Review ? ? Case: [id number] Date/Time: 01/26/22 0910  ? Procedure: TOTAL KNEE ARTHROPLASTY (Right: Knee)  ? Anesthesia type: Choice  ? Pre-op diagnosis: right knee osteoarthritis  ? Location: [facility] ROOM 09 / [facility]  ? Surgeons: Gaynelle Arabian, MD  ? ?  ? ? ?DISCUSSION:80 y.o. never smoker with h/o HTN, asthma, COPD, hypothyroidism, carotid artery stenosis (left carotid 40-59% stenosis, stable on [place] 03/20/2021 with 1 year recheck advised), right knee OA scheduled for above procedure 01/26/22 with Dr. Gaynelle Arabian.  ? ?Pt last seen by cardiology 01/01/2022. Per OV note, "Preoperative Cardiovascular Risk Assessment: The patient affirms she has been doing well without any new cardiac symptoms. They are able to achieve > 4 METS without cardiac limitations. Per RCRI, she would be low risk for noncardiac surgery.  Therefore, based on ACC/AHA guidelines, the patient would be at acceptable risk for the planned procedure without further cardiovascular testing. The patient was advised that if she develops new symptoms prior to surgery to contact our office to arrange for a follow-up visit, and she verbalized understanding." ? ?Anticipate pt can proceed with planned procedure barring acute status change.   ?VS: BP (!) 163/63   Pulse 62   Temp 36.8 ?C (Oral)   Ht '5\' 4"'$  (1.626 m)   Wt 68 kg   SpO2 100%   BMI 25.75 kg/m?  ? ?PROVIDERS: ?Sharilyn Sites, MD is PCP  ? ?Cardiologist - Dr. Quay Burow ?LABS: Labs reviewed: Acceptable for surgery. ?(all labs ordered are listed, but only abnormal results are displayed) ? ?Labs Reviewed  ?COMPREHENSIVE METABOLIC PANEL - Abnormal; Notable for the following components:  ?    Result Value  ? Glucose, Bld 119 (*)   ? All other components within normal limits  ?GLUCOSE, CAPILLARY - Abnormal; Notable for the following components:  ? Glucose-Capillary 112 (*)   ? All other components within normal limits  ?SURGICAL PCR SCREEN  ?HEMOGLOBIN A1C  ?CBC  ? ? ? ?IMAGES: ?US Carotid  03/20/2021 ?Summary:  ?Right Carotid: The extracranial vessels were near-normal with only minimal  ?wall  ?               thickening or plaque.  ? ?Left Carotid: Velocities in the left ICA are consistent with a 40-59%  ?stenosis.  ? ?Vertebrals:  Bilateral vertebral arteries demonstrate antegrade flow.  ?Subclavians: Normal flow hemodynamics were seen in bilateral subclavian  ?             arteries.  ? ?EKG: ?06/10/2021 ?Rate 55 bpm  ?Sinus bradycardia ?LAFB ?Left ventricular hypertrophy with QRS widening  ? ?CV: ?Echo 05/23/2012 ?Normal left ventricular size and systolic function.  ?Moderate diastolic function (gradeII), elevated mean left atrial pressure ?Borderline dilated left atrium  ?Mild mitral insufficiency ?Trace aortic insufficiency ?No pericardial effusion  ? ?Past Medical History:  ?Diagnosis Date  ? Arthritis   ? Asthma   ? COPD (chronic obstructive pulmonary disease) ([place])   ? Coronary artery disease   ? Cystocele 01/19/2013  ? Hypertension   ? Hypothyroidism   ? Pre-diabetes   ? Stress incontinence 01/19/2013  ? Thyroid disease   ? ? ?Past Surgical History:  ?Procedure Laterality Date  ? ABDOMINAL HYSTERECTOMY    ? ARTHROSCOPY KNEE W/ DRILLING Right   ? breast biopsy Left 04/05/2020  ? BREAST BIOPSY Left 04/05/2020  ? CATARACT EXTRACTION    ? CHOLECYSTECTOMY    ? DILATION AND CURETTAGE OF UTERUS    ? HAND SURGERY Right   ?  screws put in fingers  ? TUBAL LIGATION    ? WRIST SURGERY Left   ? ? ?MEDICATIONS: ? acetaminophen (TYLENOL) 500 MG tablet  ? Ascorbic Acid (VITAMIN C) 500 MG CHEW  ? Clobetasol Prop Emollient Base 0.05 % emollient cream  ? hydrochlorothiazide (HYDRODIURIL) 25 MG tablet  ? levothyroxine (SYNTHROID, LEVOTHROID) 100 MCG tablet  ? losartan (COZAAR) 50 MG tablet  ? Multiple Vitamins-Minerals (MULTI + OMEGA-3 ADULT GUMMIES) CHEW  ? Polyethyl Glycol-Propyl Glycol (SYSTANE ULTRA OP)  ? pravastatin (PRAVACHOL) 40 MG tablet  ? ?No current facility-administered medications for this encounter.   ? ? ? ?Konrad Felix Ward, PA-C ?[facility] ?[phone] ? ? ? ? ? ?

## 2022-01-15 NOTE — Anesthesia Preprocedure Evaluation (Addendum)
Anesthesia Evaluation  Patient identified by MRN, date of birth, ID band Patient awake    Reviewed: Allergy & Precautions, NPO status , Patient's Chart, lab work & pertinent test results  Airway Mallampati: III  TM Distance: >3 FB     Dental  (+) Teeth Intact, Caps, Dental Advisory Given   Pulmonary asthma , COPD,    breath sounds clear to auscultation       Cardiovascular hypertension, + CAD   Rhythm:Regular Rate:Normal     Neuro/Psych Anxiety    GI/Hepatic Neg liver ROS, GERD  ,  Endo/Other  Hypothyroidism   Renal/GU negative Renal ROS     Musculoskeletal  (+) Arthritis ,   Abdominal Normal abdominal exam  (+)   Peds  Hematology negative hematology ROS (+)   Anesthesia Other Findings   Reproductive/Obstetrics                           Anesthesia Physical Anesthesia Plan  ASA: 3  Anesthesia Plan: Spinal   Post-op Pain Management: Regional block*   Induction: Intravenous  PONV Risk Score and Plan: 3 and Dexamethasone, Ondansetron and Propofol infusion  Airway Management Planned: Natural Airway and Simple Face Mask  Additional Equipment: None  Intra-op Plan:   Post-operative Plan:   Informed Consent: I have reviewed the patients History and Physical, chart, labs and discussed the procedure including the risks, benefits and alternatives for the proposed anesthesia with the patient or authorized representative who has indicated his/her understanding and acceptance.     Dental advisory given  Plan Discussed with: CRNA  Anesthesia Plan Comments: (See PAT note 01/13/2022  Lab Results      Component                Value               Date                      WBC                      8.6                 01/13/2022                HGB                      13.6                01/13/2022                HCT                      41.0                01/13/2022                MCV                       98.3                01/13/2022                PLT                      336  01/13/2022           )      Anesthesia Quick Evaluation

## 2022-01-26 ENCOUNTER — Ambulatory Visit (HOSPITAL_BASED_OUTPATIENT_CLINIC_OR_DEPARTMENT_OTHER): Payer: Medicare Other | Admitting: Anesthesiology

## 2022-01-26 ENCOUNTER — Observation Stay (HOSPITAL_COMMUNITY)
Admission: RE | Admit: 2022-01-26 | Discharge: 2022-01-27 | Disposition: A | Discharge status: Home / Self Care | Payer: Medicare Other | Source: Ambulatory Visit | Attending: Orthopedic Surgery | Admitting: Orthopedic Surgery

## 2022-01-26 ENCOUNTER — Other Ambulatory Visit: Payer: Self-pay

## 2022-01-26 ENCOUNTER — Encounter (HOSPITAL_COMMUNITY): Payer: Self-pay | Admitting: Orthopedic Surgery

## 2022-01-26 ENCOUNTER — Encounter (HOSPITAL_COMMUNITY)
Admission: RE | Disposition: A | Discharge status: Home / Self Care | Payer: Self-pay | Source: Ambulatory Visit | Attending: Orthopedic Surgery

## 2022-01-26 ENCOUNTER — Ambulatory Visit (HOSPITAL_COMMUNITY): Payer: Medicare Other | Admitting: Physician Assistant

## 2022-01-26 DIAGNOSIS — I10 Essential (primary) hypertension: Secondary | ICD-10-CM | POA: Insufficient documentation

## 2022-01-26 DIAGNOSIS — M179 Osteoarthritis of knee, unspecified: Secondary | ICD-10-CM | POA: Diagnosis present

## 2022-01-26 DIAGNOSIS — E039 Hypothyroidism, unspecified: Secondary | ICD-10-CM | POA: Insufficient documentation

## 2022-01-26 DIAGNOSIS — M1711 Unilateral primary osteoarthritis, right knee: Principal | ICD-10-CM | POA: Diagnosis present

## 2022-01-26 DIAGNOSIS — I2510 Atherosclerotic heart disease of native coronary artery without angina pectoris: Secondary | ICD-10-CM | POA: Diagnosis not present

## 2022-01-26 DIAGNOSIS — J449 Chronic obstructive pulmonary disease, unspecified: Secondary | ICD-10-CM

## 2022-01-26 DIAGNOSIS — J45909 Unspecified asthma, uncomplicated: Secondary | ICD-10-CM | POA: Insufficient documentation

## 2022-01-26 DIAGNOSIS — G8918 Other acute postprocedural pain: Secondary | ICD-10-CM | POA: Diagnosis not present

## 2022-01-26 DIAGNOSIS — Z79899 Other long term (current) drug therapy: Secondary | ICD-10-CM | POA: Diagnosis not present

## 2022-01-26 HISTORY — PX: TOTAL KNEE ARTHROPLASTY: SHX125

## 2022-01-26 SURGERY — ARTHROPLASTY, KNEE, TOTAL
Anesthesia: Spinal | Site: Knee | Laterality: Right

## 2022-01-26 MED ORDER — ONDANSETRON HCL 4 MG/2ML IJ SOLN
INTRAMUSCULAR | Status: DC | PRN
  Administered 2022-01-26: 4 mg via INTRAVENOUS

## 2022-01-26 MED ORDER — CEFAZOLIN SODIUM-DEXTROSE 2-4 GM/100ML-% IV SOLN
2.0000 g | INTRAVENOUS | Status: AC
  Administered 2022-01-26: 2 g via INTRAVENOUS
  Filled 2022-01-26: qty 100

## 2022-01-26 MED ORDER — PHENYLEPHRINE HCL (PRESSORS) 10 MG/ML IV SOLN
INTRAVENOUS | Status: DC | PRN
  Administered 2022-01-26: 80 ug via INTRAVENOUS
  Administered 2022-01-26: 160 ug via INTRAVENOUS

## 2022-01-26 MED ORDER — TRANEXAMIC ACID-NACL 1000-0.7 MG/100ML-% IV SOLN
1000.0000 mg | INTRAVENOUS | Status: AC
  Administered 2022-01-26: 1000 mg via INTRAVENOUS
  Filled 2022-01-26: qty 100

## 2022-01-26 MED ORDER — BUPIVACAINE LIPOSOME 1.3 % IJ SUSP
INTRAMUSCULAR | Status: AC
  Filled 2022-01-26: qty 20

## 2022-01-26 MED ORDER — MEPERIDINE HCL 50 MG/ML IJ SOLN: 6.2500 mg | INTRAMUSCULAR | Status: DC | PRN

## 2022-01-26 MED ORDER — LEVOTHYROXINE SODIUM 100 MCG PO TABS
100.0000 ug | ORAL_TABLET | Freq: Every day | ORAL | Status: DC
  Administered 2022-01-27: 100 ug via ORAL
  Filled 2022-01-26: qty 1

## 2022-01-26 MED ORDER — BUPIVACAINE IN DEXTROSE 0.75-8.25 % IT SOLN
INTRATHECAL | Status: DC | PRN
  Administered 2022-01-26: 1.6 mL via INTRATHECAL

## 2022-01-26 MED ORDER — METOCLOPRAMIDE HCL 5 MG PO TABS: 5.0000 mg | ORAL_TABLET | Freq: Three times a day (TID) | ORAL | Status: DC | PRN

## 2022-01-26 MED ORDER — METHOCARBAMOL 500 MG IVPB - SIMPLE MED: 500.0000 mg | Freq: Four times a day (QID) | INTRAVENOUS | Status: DC | PRN

## 2022-01-26 MED ORDER — LACTATED RINGERS IV SOLN: INTRAVENOUS | Status: DC

## 2022-01-26 MED ORDER — FLEET ENEMA 7-19 GM/118ML RE ENEM: 1.0000 | ENEMA | Freq: Once | RECTAL | Status: DC | PRN

## 2022-01-26 MED ORDER — BISACODYL 10 MG RE SUPP: 10.0000 mg | Freq: Every day | RECTAL | Status: DC | PRN

## 2022-01-26 MED ORDER — LOSARTAN POTASSIUM 50 MG PO TABS: 50.0000 mg | ORAL_TABLET | Freq: Every day | ORAL | Status: DC

## 2022-01-26 MED ORDER — DEXAMETHASONE SODIUM PHOSPHATE 10 MG/ML IJ SOLN
8.0000 mg | Freq: Once | INTRAMUSCULAR | Status: AC
  Administered 2022-01-26: 10 mg via INTRAVENOUS

## 2022-01-26 MED ORDER — ACETAMINOPHEN 10 MG/ML IV SOLN: 1000.0000 mg | Freq: Once | INTRAVENOUS | Status: DC | PRN

## 2022-01-26 MED ORDER — CEFAZOLIN SODIUM-DEXTROSE 2-4 GM/100ML-% IV SOLN
2.0000 g | Freq: Four times a day (QID) | INTRAVENOUS | Status: AC
  Administered 2022-01-26 – 2022-01-27 (×2): 2 g via INTRAVENOUS
  Filled 2022-01-26 (×2): qty 100

## 2022-01-26 MED ORDER — ONDANSETRON HCL 4 MG/2ML IJ SOLN
4.0000 mg | Freq: Four times a day (QID) | INTRAMUSCULAR | Status: DC | PRN
  Administered 2022-01-26 – 2022-01-27 (×3): 4 mg via INTRAVENOUS
  Filled 2022-01-26 (×3): qty 2

## 2022-01-26 MED ORDER — MORPHINE SULFATE (PF) 2 MG/ML IV SOLN: 1.0000 mg | INTRAVENOUS | Status: DC | PRN

## 2022-01-26 MED ORDER — HYDROMORPHONE HCL 1 MG/ML IJ SOLN: 0.2500 mg | INTRAMUSCULAR | Status: DC | PRN

## 2022-01-26 MED ORDER — ONDANSETRON HCL 4 MG PO TABS: 4.0000 mg | ORAL_TABLET | Freq: Four times a day (QID) | ORAL | Status: DC | PRN

## 2022-01-26 MED ORDER — FENTANYL CITRATE (PF) 100 MCG/2ML IJ SOLN
INTRAMUSCULAR | Status: AC
  Filled 2022-01-26: qty 2

## 2022-01-26 MED ORDER — SODIUM CHLORIDE 0.9 % IV SOLN: INTRAVENOUS | Status: DC

## 2022-01-26 MED ORDER — POVIDONE-IODINE 10 % EX SWAB
2.0000 "application " | Freq: Once | CUTANEOUS | Status: AC
  Administered 2022-01-26: 2 via TOPICAL

## 2022-01-26 MED ORDER — SODIUM CHLORIDE (PF) 0.9 % IJ SOLN
INTRAMUSCULAR | Status: AC
  Filled 2022-01-26: qty 10

## 2022-01-26 MED ORDER — HYDROCODONE-ACETAMINOPHEN 5-325 MG PO TABS
1.0000 | ORAL_TABLET | Freq: Four times a day (QID) | ORAL | Status: DC | PRN
  Filled 2022-01-26: qty 1

## 2022-01-26 MED ORDER — METHOCARBAMOL 500 MG PO TABS
500.0000 mg | ORAL_TABLET | Freq: Four times a day (QID) | ORAL | Status: DC | PRN
  Administered 2022-01-27 (×2): 500 mg via ORAL
  Filled 2022-01-26 (×2): qty 1

## 2022-01-26 MED ORDER — MENTHOL 3 MG MT LOZG: 1.0000 | LOZENGE | OROMUCOSAL | Status: DC | PRN

## 2022-01-26 MED ORDER — CHLORHEXIDINE GLUCONATE 0.12 % MT SOLN
15.0000 mL | Freq: Once | OROMUCOSAL | Status: AC
  Administered 2022-01-26: 15 mL via OROMUCOSAL

## 2022-01-26 MED ORDER — MIDAZOLAM HCL 2 MG/2ML IJ SOLN
INTRAMUSCULAR | Status: AC
  Filled 2022-01-26: qty 2

## 2022-01-26 MED ORDER — TRAMADOL HCL 50 MG PO TABS
50.0000 mg | ORAL_TABLET | Freq: Four times a day (QID) | ORAL | Status: DC | PRN
  Administered 2022-01-26 – 2022-01-27 (×3): 100 mg via ORAL
  Filled 2022-01-26 (×3): qty 2

## 2022-01-26 MED ORDER — AMISULPRIDE (ANTIEMETIC) 5 MG/2ML IV SOLN: 10.0000 mg | Freq: Once | INTRAVENOUS | Status: DC | PRN

## 2022-01-26 MED ORDER — FENTANYL CITRATE PF 50 MCG/ML IJ SOSY: 25.0000 ug | PREFILLED_SYRINGE | Freq: Once | INTRAMUSCULAR | Status: AC

## 2022-01-26 MED ORDER — BUPIVACAINE LIPOSOME 1.3 % IJ SUSP
INTRAMUSCULAR | Status: DC | PRN
  Administered 2022-01-26: 20 mL

## 2022-01-26 MED ORDER — SODIUM CHLORIDE 0.9 % IR SOLN
Status: DC | PRN
  Administered 2022-01-26 (×2): 1000 mL

## 2022-01-26 MED ORDER — EPHEDRINE SULFATE (PRESSORS) 50 MG/ML IJ SOLN
INTRAMUSCULAR | Status: DC | PRN
  Administered 2022-01-26: 5 mg via INTRAVENOUS

## 2022-01-26 MED ORDER — POLYETHYLENE GLYCOL 3350 17 G PO PACK: 17.0000 g | PACK | Freq: Every day | ORAL | Status: DC | PRN

## 2022-01-26 MED ORDER — SODIUM CHLORIDE (PF) 0.9 % IJ SOLN
INTRAMUSCULAR | Status: AC
  Filled 2022-01-26: qty 50

## 2022-01-26 MED ORDER — BUPIVACAINE LIPOSOME 1.3 % IJ SUSP: 20.0000 mL | Freq: Once | INTRAMUSCULAR | Status: DC

## 2022-01-26 MED ORDER — ASPIRIN 325 MG PO TBEC
325.0000 mg | DELAYED_RELEASE_TABLET | Freq: Two times a day (BID) | ORAL | Status: DC
  Administered 2022-01-27: 325 mg via ORAL
  Filled 2022-01-26: qty 1

## 2022-01-26 MED ORDER — ROPIVACAINE HCL 5 MG/ML IJ SOLN
INTRAMUSCULAR | Status: DC | PRN
  Administered 2022-01-26: 30 mL via PERINEURAL

## 2022-01-26 MED ORDER — PHENYLEPHRINE HCL (PRESSORS) 10 MG/ML IV SOLN
INTRAVENOUS | Status: AC
  Filled 2022-01-26: qty 1

## 2022-01-26 MED ORDER — OXYCODONE HCL 5 MG PO TABS
ORAL_TABLET | ORAL | Status: AC
  Administered 2022-01-26: 5 mg via ORAL
  Filled 2022-01-26: qty 1

## 2022-01-26 MED ORDER — SODIUM CHLORIDE (PF) 0.9 % IJ SOLN
INTRAMUSCULAR | Status: DC | PRN
  Administered 2022-01-26: 60 mL

## 2022-01-26 MED ORDER — DOCUSATE SODIUM 100 MG PO CAPS
100.0000 mg | ORAL_CAPSULE | Freq: Two times a day (BID) | ORAL | Status: DC
  Administered 2022-01-26 – 2022-01-27 (×2): 100 mg via ORAL
  Filled 2022-01-26 (×2): qty 1

## 2022-01-26 MED ORDER — ACETAMINOPHEN 160 MG/5ML PO SOLN: 325.0000 mg | Freq: Once | ORAL | Status: DC | PRN

## 2022-01-26 MED ORDER — PHENOL 1.4 % MT LIQD: 1.0000 | OROMUCOSAL | Status: DC | PRN

## 2022-01-26 MED ORDER — ACETAMINOPHEN 325 MG PO TABS: 325.0000 mg | ORAL_TABLET | Freq: Once | ORAL | Status: DC | PRN

## 2022-01-26 MED ORDER — METOCLOPRAMIDE HCL 5 MG/ML IJ SOLN
5.0000 mg | Freq: Three times a day (TID) | INTRAMUSCULAR | Status: DC | PRN
  Administered 2022-01-26 – 2022-01-27 (×2): 10 mg via INTRAVENOUS
  Filled 2022-01-26 (×2): qty 2

## 2022-01-26 MED ORDER — HYDROCHLOROTHIAZIDE 25 MG PO TABS: 25.0000 mg | ORAL_TABLET | Freq: Every day | ORAL | Status: DC

## 2022-01-26 MED ORDER — ACETAMINOPHEN 10 MG/ML IV SOLN
1000.0000 mg | Freq: Four times a day (QID) | INTRAVENOUS | Status: DC
  Administered 2022-01-26: 1000 mg via INTRAVENOUS
  Filled 2022-01-26: qty 100

## 2022-01-26 MED ORDER — PROPOFOL 500 MG/50ML IV EMUL
INTRAVENOUS | Status: DC | PRN
  Administered 2022-01-26: 50 ug/kg/min via INTRAVENOUS

## 2022-01-26 MED ORDER — ACETAMINOPHEN 500 MG PO TABS
1000.0000 mg | ORAL_TABLET | Freq: Four times a day (QID) | ORAL | Status: DC
  Administered 2022-01-26 – 2022-01-27 (×2): 1000 mg via ORAL
  Filled 2022-01-26 (×2): qty 2

## 2022-01-26 MED ORDER — METHOCARBAMOL 500 MG IVPB - SIMPLE MED
INTRAVENOUS | Status: AC
  Administered 2022-01-26: 500 mg via INTRAVENOUS
  Filled 2022-01-26: qty 50

## 2022-01-26 MED ORDER — PRAVASTATIN SODIUM 20 MG PO TABS: 40.0000 mg | ORAL_TABLET | Freq: Every day | ORAL | Status: DC

## 2022-01-26 MED ORDER — FENTANYL CITRATE PF 50 MCG/ML IJ SOSY
PREFILLED_SYRINGE | INTRAMUSCULAR | Status: AC
  Administered 2022-01-26: 25 ug via INTRAVENOUS
  Filled 2022-01-26: qty 2

## 2022-01-26 MED ORDER — FENTANYL CITRATE (PF) 100 MCG/2ML IJ SOLN
INTRAMUSCULAR | Status: DC | PRN
Start: 2022-01-26 — End: 2022-01-26
  Administered 2022-01-26: 100 ug via INTRAVENOUS

## 2022-01-26 MED ORDER — ORAL CARE MOUTH RINSE: 15.0000 mL | Freq: Once | OROMUCOSAL | Status: AC

## 2022-01-26 MED ORDER — PHENYLEPHRINE HCL-NACL 20-0.9 MG/250ML-% IV SOLN
INTRAVENOUS | Status: DC | PRN
  Administered 2022-01-26: 30 ug/min via INTRAVENOUS

## 2022-01-26 MED ORDER — OXYCODONE HCL 5 MG PO TABS: 5.0000 mg | ORAL_TABLET | ORAL | Status: DC | PRN

## 2022-01-26 MED ORDER — DEXAMETHASONE SODIUM PHOSPHATE 10 MG/ML IJ SOLN
10.0000 mg | Freq: Once | INTRAMUSCULAR | Status: AC
  Administered 2022-01-27: 10 mg via INTRAVENOUS
  Filled 2022-01-26: qty 1

## 2022-01-26 SURGICAL SUPPLY — 58 items
BAG COUNTER SPONGE SURGICOUNT (BAG) ×1 IMPLANT
BAG SPEC THK2 15X12 ZIP CLS (MISCELLANEOUS) ×1
BAG SPNG CNTER NS LX DISP (BAG) ×1
BAG ZIPLOCK 12X15 (MISCELLANEOUS) ×2 IMPLANT
BLADE SAG 18X100X1.27 (BLADE) ×2 IMPLANT
BLADE SAW SGTL 11.0X1.19X90.0M (BLADE) ×2 IMPLANT
BNDG ELASTIC 6X5.8 VLCR STR LF (GAUZE/BANDAGES/DRESSINGS) ×2 IMPLANT
BOWL SMART MIX CTS (DISPOSABLE) ×2 IMPLANT
CEMENT HV SMART SET (Cement) ×4 IMPLANT
CEMENT TIBIA MBT SIZE 2.5 (Knees) IMPLANT
COVER SURGICAL LIGHT HANDLE (MISCELLANEOUS) ×2 IMPLANT
CUFF TOURN SGL QUICK 34 (TOURNIQUET CUFF) ×2
CUFF TRNQT CYL 34X4.125X (TOURNIQUET CUFF) ×1 IMPLANT
DRAPE INCISE IOBAN 66X45 STRL (DRAPES) ×2 IMPLANT
DRAPE U-SHAPE 47X51 STRL (DRAPES) ×2 IMPLANT
DRSG AQUACEL AG ADV 3.5X10 (GAUZE/BANDAGES/DRESSINGS) ×2 IMPLANT
DURAPREP 26ML APPLICATOR (WOUND CARE) ×2 IMPLANT
ELECT REM PT RETURN 15FT ADLT (MISCELLANEOUS) ×2 IMPLANT
FEMUR SIGMA PS SZ 3.0 R (Femur) ×1 IMPLANT
GLOVE BIO SURGEON STRL SZ 6.5 (GLOVE) ×3 IMPLANT
GLOVE BIO SURGEON STRL SZ7.5 (GLOVE) IMPLANT
GLOVE BIO SURGEON STRL SZ8 (GLOVE) ×2 IMPLANT
GLOVE BIOGEL PI IND STRL 6.5 (GLOVE) IMPLANT
GLOVE BIOGEL PI IND STRL 7.0 (GLOVE) IMPLANT
GLOVE BIOGEL PI IND STRL 8 (GLOVE) ×1 IMPLANT
GLOVE BIOGEL PI INDICATOR 6.5 (GLOVE)
GLOVE BIOGEL PI INDICATOR 7.0 (GLOVE)
GLOVE BIOGEL PI INDICATOR 8 (GLOVE) ×1
GOWN STRL REUS W/ TWL LRG LVL3 (GOWN DISPOSABLE) ×1 IMPLANT
GOWN STRL REUS W/ TWL XL LVL3 (GOWN DISPOSABLE) IMPLANT
GOWN STRL REUS W/TWL LRG LVL3 (GOWN DISPOSABLE) ×2
GOWN STRL REUS W/TWL XL LVL3 (GOWN DISPOSABLE)
HANDPIECE INTERPULSE COAX TIP (DISPOSABLE) ×2
HOLDER FOLEY CATH W/STRAP (MISCELLANEOUS) ×1 IMPLANT
IMMOBILIZER KNEE 20 (SOFTGOODS) ×2
IMMOBILIZER KNEE 20 THIGH 36 (SOFTGOODS) ×1 IMPLANT
INSERT TIBIAL PFC SIG SZ3 10MM (Knees) ×1 IMPLANT
KIT TURNOVER KIT A (KITS) IMPLANT
MANIFOLD NEPTUNE II (INSTRUMENTS) ×2 IMPLANT
NS IRRIG 1000ML POUR BTL (IV SOLUTION) ×2 IMPLANT
PACK TOTAL KNEE CUSTOM (KITS) ×2 IMPLANT
PADDING CAST COTTON 6X4 STRL (CAST SUPPLIES) ×3 IMPLANT
PATELLA DOME PFC 35MM (Knees) ×1 IMPLANT
PROTECTOR NERVE ULNAR (MISCELLANEOUS) ×2 IMPLANT
SET HNDPC FAN SPRY TIP SCT (DISPOSABLE) ×1 IMPLANT
SPIKE FLUID TRANSFER (MISCELLANEOUS) ×1 IMPLANT
STRIP CLOSURE SKIN 1/2X4 (GAUZE/BANDAGES/DRESSINGS) ×4 IMPLANT
SUT MNCRL AB 4-0 PS2 18 (SUTURE) ×2 IMPLANT
SUT STRATAFIX 0 PDS 27 VIOLET (SUTURE) ×2
SUT VIC AB 2-0 CT1 27 (SUTURE) ×6
SUT VIC AB 2-0 CT1 TAPERPNT 27 (SUTURE) ×3 IMPLANT
SUTURE STRATFX 0 PDS 27 VIOLET (SUTURE) ×1 IMPLANT
TIBIA MBT CEMENT SIZE 2.5 (Knees) ×2 IMPLANT
TRAY FOLEY MTR SLVR 14FR STAT (SET/KITS/TRAYS/PACK) ×1 IMPLANT
TRAY FOLEY MTR SLVR 16FR STAT (SET/KITS/TRAYS/PACK) ×1 IMPLANT
TUBE SUCTION HIGH CAP CLEAR NV (SUCTIONS) ×2 IMPLANT
WATER STERILE IRR 1000ML POUR (IV SOLUTION) ×4 IMPLANT
WRAP KNEE MAXI GEL POST OP (GAUZE/BANDAGES/DRESSINGS) ×2 IMPLANT

## 2022-01-26 NOTE — Anesthesia Postprocedure Evaluation (Signed)
Anesthesia Post Note  Patient: RAKIYA KRAWCZYK  Procedure(s) Performed: TOTAL KNEE ARTHROPLASTY (Right: Knee)     Patient location during evaluation: PACU Anesthesia Type: Spinal Level of consciousness: oriented and awake and alert Pain management: pain level controlled Vital Signs Assessment: post-procedure vital signs reviewed and stable Respiratory status: spontaneous breathing, respiratory function stable and patient connected to nasal cannula oxygen Cardiovascular status: blood pressure returned to baseline and stable Postop Assessment: no headache, no backache, no apparent nausea or vomiting and spinal receding Anesthetic complications: no   No notable events documented.  Last Vitals:  Vitals:   01/26/22 1230 01/26/22 1303  BP: (!) 164/75 (!) 166/68  Pulse: 72 71  Resp: 19 18  Temp: 36.7 C (!) 36.3 C  SpO2: 99% 98%    Last Pain:  Vitals:   01/26/22 1303  TempSrc: Oral  PainSc:                  Effie Berkshire

## 2022-01-26 NOTE — Anesthesia Procedure Notes (Signed)
Anesthesia Regional Block: Adductor canal block   Pre-Anesthetic Checklist: , timeout performed,  Correct Patient, Correct Site, Correct Laterality,  Correct Procedure, Correct Position, site marked,  Risks and benefits discussed,  Surgical consent,  Pre-op evaluation,  At surgeon's request and post-op pain management  Laterality: Right  Prep: chloraprep       Needles:  Injection technique: Single-shot  Needle Type: Echogenic Stimulator Needle     Needle Length: 9cm  Needle Gauge: 21     Additional Needles:   Procedures:,,,, ultrasound used (permanent image in chart),,    Narrative:  Start time: 01/26/2022 8:33 AM End time: 01/26/2022 8:38 AM Injection made incrementally with aspirations every 5 mL.  Performed by: Personally  Anesthesiologist: Effie Berkshire, MD  Additional Notes: Patient tolerated the procedure well. Local anesthetic introduced in an incremental fashion under minimal resistance after negative aspirations. No paresthesias were elicited. After completion of the procedure, no acute issues were identified and patient continued to be monitored by RN.

## 2022-01-26 NOTE — Transfer of Care (Signed)
Immediate Anesthesia Transfer of Care Note  Patient: April Lang  Procedure(s) Performed: TOTAL KNEE ARTHROPLASTY (Right: Knee)  Patient Location: PACU  Anesthesia Type:Spinal  Level of Consciousness: awake, alert  and oriented  Airway & Oxygen Therapy: Patient Spontanous Breathing and Patient connected to face mask oxygen  Post-op Assessment: Report given to RN and Post -op Vital signs reviewed and stable  Post vital signs: Reviewed and stable  Last Vitals:  Vitals Value Taken Time  BP 118/54 01/26/22 1100  Temp    Pulse 64 01/26/22 1106  Resp 22 01/26/22 1106  SpO2 97 % 01/26/22 1106  Vitals shown include unvalidated device data.  Last Pain:  Vitals:   01/26/22 0738  TempSrc: Oral         Complications: No notable events documented.

## 2022-01-26 NOTE — Progress Notes (Signed)
Orthopedic Tech Progress Note Patient Details:  April Lang 09-Jan-1941 [id number]  CPM Right Knee CPM Right Knee: On Right Knee Flexion (Degrees): 40 Right Knee Extension (Degrees): 10  Post Interventions Patient Tolerated: Well Instructions Provided: Care of device, Adjustment of device  [name] 01/26/2022, 10:50 AM

## 2022-01-26 NOTE — Care Plan (Signed)
Ortho Bundle Case Management Note  Patient Details  Name: MONSERRATE BLASCHKE MRN: [id number] Date of Birth: [birth year]                  R TKA on 01-26-22 DCP: Home with husband and sister. Husband with some dementia. DME: No needs, has a RW PT: [facility] on 01-29-22   DME Arranged:  N/A DME Agency:     HH Arranged:    [place] Agency:     Additional Comments: Please contact me with any questions of if this plan should need to change.  Marianne Sofia, RN,CCM [facility]  [phone] 01/26/2022, 2:29 PM

## 2022-01-26 NOTE — Op Note (Signed)
OPERATIVE REPORT-TOTAL KNEE ARTHROPLASTY   Pre-operative diagnosis- Osteoarthritis  Right knee(s)  Post-operative diagnosis- Osteoarthritis Right knee(s)  Procedure-  Right  Total Knee Arthroplasty  Surgeon- Dione Plover. , MD  Assistant- Theresa Duty, PA-C   Anesthesia-   Adductor canal block and spinal  EBL-25 mL   Drains None  Tourniquet time-  Total Tourniquet Time Documented: Thigh (Right) - 29 minutes Total: Thigh (Right) - 29 minutes     Complications- None  Condition-PACU - hemodynamically stable.   Brief Clinical Note  April Lang is a 81 y.o. year old female with end stage OA of her right knee with progressively worsening pain and dysfunction. She has constant pain, with activity and at rest and significant functional deficits with difficulties even with ADLs. She has had extensive non-op management including analgesics, injections of cortisone and viscosupplements, and home exercise program, but remains in significant pain with significant dysfunction.Radiographs show bone on bone arthritis lateral and patellofemoral with valgus deformity. She presents now for right Total Knee Arthroplasty.     Procedure in detail---   The patient is brought into the operating room and positioned supine on the operating table. After successful administration of  Adductor canal block and spinal,   a tourniquet is placed high on the  Right thigh(s) and the lower extremity is prepped and draped in the usual sterile fashion. Time out is performed by the operating team and then the  Right lower extremity is wrapped in Esmarch, knee flexed and the tourniquet inflated to 300 mmHg.       A midline incision is made with a ten blade through the subcutaneous tissue to the level of the extensor mechanism. A fresh blade is used to make a medial parapatellar arthrotomy. Soft tissue over the proximal medial tibia is subperiosteally elevated to the joint line with a knife and into the  semimembranosus bursa with a Cobb elevator. Soft tissue over the proximal lateral tibia is elevated with attention being paid to avoiding the patellar tendon on the tibial tubercle. The patella is everted, knee flexed 90 degrees and the ACL and PCL are removed. Findings are bone on bone lateral and patellofemoral with large global osteophytes.        The drill is used to create a starting hole in the distal femur and the canal is thoroughly irrigated with sterile saline to remove the fatty contents. The 5 degree Right  valgus alignment guide is placed into the femoral canal and the distal femoral cutting block is pinned to remove 9 mm off the distal femur. Resection is made with an oscillating saw.      The tibia is subluxed forward and the menisci are removed. The extramedullary alignment guide is placed referencing proximally at the medial aspect of the tibial tubercle and distally along the second metatarsal axis and tibial crest. The block is pinned to remove 53m off the more deficient lateral  side. Resection is made with an oscillating saw. Size 2.5is the most appropriate size for the tibia and the proximal tibia is prepared with the modular drill and keel punch for that size.      The femoral sizing guide is placed and size 3 is most appropriate. Rotation is marked off the epicondylar axis and confirmed by creating a rectangular flexion gap at 90 degrees. The size 3 cutting block is pinned in this rotation and the anterior, posterior and chamfer cuts are made with the oscillating saw. The intercondylar block is then placed and that  cut is made.      Trial size 2.5 tibial component, trial size 3 posterior stabilized femur and a 10  mm posterior stabilized rotating platform insert trial is placed. Full extension is achieved with excellent varus/valgus and anterior/posterior balance throughout full range of motion. The patella is everted and thickness measured to be 19  mm. Free hand resection is taken to 11  mm, a 35 template is placed, lug holes are drilled, trial patella is placed, and it tracks normally. Osteophytes are removed off the posterior femur with the trial in place. All trials are removed and the cut bone surfaces prepared with pulsatile lavage. Cement is mixed and once ready for implantation, the size 2.5 tibial implant, size  3 posterior stabilized femoral component, and the size 35 patella are cemented in place and the patella is held with the clamp. The trial insert is placed and the knee held in full extension. The Exparel (20 ml mixed with 60 ml saline) is injected into the extensor mechanism, posterior capsule, medial and lateral gutters and subcutaneous tissues.  All extruded cement is removed and once the cement is hard the permanent 10 mm posterior stabilized rotating platform insert is placed into the tibial tray.      The wound is copiously irrigated with saline solution and the extensor mechanism closed with # 0 Stratofix suture. The tourniquet is released for a total tourniquet time of 29  minutes. Flexion against gravity is 140 degrees and the patella tracks normally. Subcutaneous tissue is closed with 2.0 vicryl and subcuticular with running 4.0 Monocryl. The incision is cleaned and dried and steri-strips and a bulky sterile dressing are applied. The limb is placed into a knee immobilizer and the patient is awakened and transported to recovery in stable condition.      Please note that a surgical assistant was a medical necessity for this procedure in order to perform it in a safe and expeditious manner. Surgical assistant was necessary to retract the ligaments and vital neurovascular structures to prevent injury to them and also necessary for proper positioning of the limb to allow for anatomic placement of the prosthesis.   Dione Plover , MD    01/26/2022, 10:35 AM

## 2022-01-26 NOTE — Discharge Instructions (Signed)
 Frank Aluisio, MD Total Joint Specialist [facility] [place] [address] [place], [place] [address] [phone]  TOTAL KNEE REPLACEMENT POSTOPERATIVE DIRECTIONS    Knee Rehabilitation, Guidelines Following Surgery  Results after knee surgery are often greatly improved when you follow the exercise, range of motion and muscle strengthening exercises prescribed by your doctor. Safety measures are also important to protect the knee from further injury. If any of these exercises cause you to have increased pain or swelling in your knee joint, decrease the amount until you are comfortable again and slowly increase them. If you have problems or questions, call your caregiver or physical therapist for advice.   BLOOD CLOT PREVENTION Take a 325 mg Aspirin two times a day for three weeks following surgery. Then take an 81 mg Aspirin once a day for three weeks. Then discontinue Aspirin. You may resume your vitamins/supplements upon discharge from the hospital. Do not take any NSAIDs (Advil, Aleve, Ibuprofen, Meloxicam, etc.) until you have discontinued the 325 mg Aspirin.  HOME CARE INSTRUCTIONS  Remove items at home which could result in a fall. This includes throw rugs or furniture in walking pathways.  ICE to the affected knee as much as tolerated. Icing helps control swelling. If the swelling is well controlled you will be more comfortable and rehab easier. Continue to use ice on the knee for pain and swelling from surgery. You may notice swelling that will progress down to the foot and ankle. This is normal after surgery. Elevate the leg when you are not up walking on it.    Continue to use the breathing machine which will help keep your temperature down. It is common for your temperature to cycle up and down following surgery, especially at night when you are not up moving around and exerting yourself. The breathing machine keeps your lungs expanded and your temperature  down. Do not place pillow under the operative knee, focus on keeping the knee straight while resting  DIET You may resume your previous home diet once you are discharged from the hospital.  DRESSING / WOUND CARE / SHOWERING Keep your bulky bandage on for 2 days. On the third post-operative day you may remove the Ace bandage and gauze. There is a waterproof adhesive bandage on your skin which will stay in place until your first follow-up appointment. Once you remove this you will not need to place another bandage You may begin showering 3 days following surgery, but do not submerge the incision under water.  ACTIVITY For the first 5 days, the key is rest and control of pain and swelling Do your home exercises twice a day starting on post-operative day 3. On the days you go to physical therapy, just do the home exercises once that day. You should rest, ice and elevate the leg for 50 minutes out of every hour. Get up and walk/stretch for 10 minutes per hour. After 5 days you can increase your activity slowly as tolerated. Walk with your walker as instructed. Use the walker until you are comfortable transitioning to a cane. Walk with the cane in the opposite hand of the operative leg. You may discontinue the cane once you are comfortable and walking steadily. Avoid periods of inactivity such as sitting longer than an hour when not asleep. This helps prevent blood clots.  You may discontinue the knee immobilizer once you are able to perform a straight leg raise while lying down. You may resume a sexual relationship in one month   or when given the OK by your doctor.  You may return to work once you are cleared by your doctor.  Do not drive a car for 6 weeks or until released by your surgeon.  Do not drive while taking narcotics.  TED HOSE STOCKINGS Wear the elastic stockings on both legs for three weeks following surgery during the day. You may remove them at night for sleeping.  WEIGHT  BEARING Weight bearing as tolerated with assist device (walker, cane, etc) as directed, use it as long as suggested by your surgeon or therapist, typically at least 4-6 weeks.  POSTOPERATIVE CONSTIPATION PROTOCOL Constipation - defined medically as fewer than three stools per week and severe constipation as less than one stool per week.  One of the most common issues patients have following surgery is constipation.  Even if you have a regular bowel pattern at home, your normal regimen is likely to be disrupted due to multiple reasons following surgery.  Combination of anesthesia, postoperative narcotics, change in appetite and fluid intake all can affect your bowels.  In order to avoid complications following surgery, here are some recommendations in order to help you during your recovery period.  Colace (docusate) - Pick up an over-the-counter form of Colace or another stool softener and take twice a day as long as you are requiring postoperative pain medications.  Take with a full glass of water daily.  If you experience loose stools or diarrhea, hold the colace until you stool forms back up. If your symptoms do not get better within 1 week or if they get worse, check with your doctor. Dulcolax (bisacodyl) - Pick up over-the-counter and take as directed by the product packaging as needed to assist with the movement of your bowels.  Take with a full glass of water.  Use this product as needed if not relieved by Colace only.  MiraLax (polyethylene glycol) - Pick up over-the-counter to have on hand. MiraLax is a solution that will increase the amount of water in your bowels to assist with bowel movements.  Take as directed and can mix with a glass of water, juice, soda, coffee, or tea. Take if you go more than two days without a movement. Do not use MiraLax more than once per day. Call your doctor if you are still constipated or irregular after using this medication for 7 days in a row.  If you continue  to have problems with postoperative constipation, please contact the office for further assistance and recommendations.  If you experience "the worst abdominal pain ever" or develop nausea or vomiting, please contact the office immediatly for further recommendations for treatment.  ITCHING If you experience itching with your medications, try taking only a single pain pill, or even half a pain pill at a time.  You can also use Benadryl over the counter for itching or also to help with sleep.   MEDICATIONS See your medication summary on the "After Visit Summary" that the nursing staff will review with you prior to discharge.  You may have some home medications which will be placed on hold until you complete the course of blood thinner medication.  It is important for you to complete the blood thinner medication as prescribed by your surgeon.  Continue your approved medications as instructed at time of discharge.  PRECAUTIONS If you experience chest pain or shortness of breath - call 911 immediately for transfer to the hospital emergency department.  If you develop a fever greater that 101 F,   purulent drainage from wound, increased redness or drainage from wound, foul odor from the wound/dressing, or calf pain - CONTACT YOUR SURGEON.                                                   FOLLOW-UP APPOINTMENTS Make sure you keep all of your appointments after your operation with your surgeon and caregivers. You should call the office at the above phone number and make an appointment for approximately two weeks after the date of your surgery or on the date instructed by your surgeon outlined in the "After Visit Summary".  RANGE OF MOTION AND STRENGTHENING EXERCISES  Rehabilitation of the knee is important following a knee injury or an operation. After just a few days of immobilization, the muscles of the thigh which control the knee become weakened and shrink (atrophy). Knee exercises are designed to build up  the tone and strength of the thigh muscles and to improve knee motion. Often times heat used for twenty to thirty minutes before working out will loosen up your tissues and help with improving the range of motion but do not use heat for the first two weeks following surgery. These exercises can be done on a training (exercise) mat, on the floor, on a table or on a bed. Use what ever works the best and is most comfortable for you Knee exercises include:  Leg Lifts - While your knee is still immobilized in a splint or cast, you can do straight leg raises. Lift the leg to 60 degrees, hold for 3 sec, and slowly lower the leg. Repeat 10-20 times 2-3 times daily. Perform this exercise against resistance later as your knee gets better.  Quad and Hamstring Sets - Tighten up the muscle on the front of the thigh (Quad) and hold for 5-10 sec. Repeat this 10-20 times hourly. Hamstring sets are done by pushing the foot backward against an object and holding for 5-10 sec. Repeat as with quad sets.  Leg Slides: Lying on your back, slowly slide your foot toward your buttocks, bending your knee up off the floor (only go as far as is comfortable). Then slowly slide your foot back down until your leg is flat on the floor again. Angel Wings: Lying on your back spread your legs to the side as far apart as you can without causing discomfort.  A rehabilitation program following serious knee injuries can speed recovery and prevent re-injury in the future due to weakened muscles. Contact your doctor or a physical therapist for more information on knee rehabilitation.   POST-OPERATIVE OPIOID TAPER INSTRUCTIONS: It is important to wean off of your opioid medication as soon as possible. If you do not need pain medication after your surgery it is ok to stop day one. Opioids include: Codeine, Hydrocodone(Norco, Vicodin), Oxycodone(Percocet, oxycontin) and hydromorphone amongst others.  Long term and even short term use of opiods can  cause: Increased pain response Dependence Constipation Depression Respiratory depression And more.  Withdrawal symptoms can include Flu like symptoms Nausea, vomiting And more Techniques to manage these symptoms Hydrate well Eat regular healthy meals Stay active Use relaxation techniques(deep breathing, meditating, yoga) Do Not substitute Alcohol to help with tapering If you have been on opioids for less than two weeks and do not have pain than it is ok to stop all together.  Plan   to wean off of opioids This plan should start within one week post op of your joint replacement. Maintain the same interval or time between taking each dose and first decrease the dose.  Cut the total daily intake of opioids by one tablet each day Next start to increase the time between doses. The last dose that should be eliminated is the evening dose.   IF YOU ARE TRANSFERRED TO A SKILLED REHAB FACILITY If the patient is transferred to a skilled rehab facility following release from the hospital, a list of the current medications will be sent to the facility for the patient to continue.  When discharged from the skilled rehab facility, please have the facility set up the patient's Home Health Physical Therapy prior to being released. Also, the skilled facility will be responsible for providing the patient with their medications at time of release from the facility to include their pain medication, the muscle relaxants, and their blood thinner medication. If the patient is still at the rehab facility at time of the two week follow up appointment, the skilled rehab facility will also need to assist the patient in arranging follow up appointment in our office and any transportation needs.  MAKE SURE YOU:  Understand these instructions.  Get help right away if you are not doing well or get worse.   DENTAL ANTIBIOTICS:  In most cases prophylactic antibiotics for Dental procdeures after total joint surgery are  not necessary.  Exceptions are as follows:  1. History of prior total joint infection  2. Severely immunocompromised (Organ Transplant, cancer chemotherapy, Rheumatoid biologic meds such as Humera)  3. Poorly controlled diabetes (A1C &gt; 8.0, blood glucose over 200)  If you have one of these conditions, contact your surgeon for an antibiotic prescription, prior to your dental procedure.    Pick up stool softner and laxative for home use following surgery while on pain medications. Do not submerge incision under water. Please use good hand washing techniques while changing dressing each day. May shower starting three days after surgery. Please use a clean towel to pat the incision dry following showers. Continue to use ice for pain and swelling after surgery. Do not use any lotions or creams on the incision until instructed by your surgeon.  

## 2022-01-26 NOTE — Evaluation (Signed)
Physical Therapy Evaluation Patient Details Name: April Lang MRN: [id number] DOB: 10-May-1941 Today's Date: 01/26/2022  History of Present Illness  Pt is an 81yo female presenting s/p R-TKA on 01/26/22. PMH: COPD, CAD, HTN, hypothyroidism.  Clinical Impression  April Lang is a 81 y.o. female POD 0 s/p R-TKA. Patient reports independence with mobility at baseline. Patient is now limited by functional impairments (see PT problem list below) and requires min guard for bed mobility and transfers. Patient experiencing nausea and lightheadedness,VSS, further mobility deferred, RN aware. Patient instructed in exercise to facilitate ROM and circulation to manage edema. Patient will benefit from continued skilled PT interventions to address impairments and progress towards PLOF. Acute PT will follow to progress mobility and  HEP in preparation for safe discharge home.       Recommendations for follow up therapy are one component of a multi-disciplinary discharge planning process, led by the attending physician.  Recommendations may be updated based on patient status, additional functional criteria and insurance authorization.  Follow Up Recommendations Follow physician's recommendations for discharge plan and follow up therapies    Assistance Recommended at Discharge Intermittent Supervision/Assistance  Patient can return home with the following  A little help with walking and/or transfers;A little help with bathing/dressing/bathroom;Assistance with cooking/housework;Assist for transportation;Help with stairs or ramp for entrance    Equipment Recommendations None recommended by PT  Recommendations for Other Services       Functional Status Assessment Patient has had a recent decline in their functional status and demonstrates the ability to make significant improvements in function in a reasonable and predictable amount of time.     Precautions / Restrictions Precautions Precautions:  None Required Braces or Orthoses: Knee Immobilizer - Right Knee Immobilizer - Right: On when out of bed or walking Restrictions Weight Bearing Restrictions: No Other Position/Activity Restrictions: WBAT      Mobility  Bed Mobility Overal bed mobility: Needs Assistance Bed Mobility: Supine to Sit     Supine to sit: Min guard     General bed mobility comments: For safety only, no physical assist required. BP in supine 153/65    Transfers Overall transfer level: Needs assistance Equipment used: Rolling walker (2 wheels) Transfers: Sit to/from Stand, Bed to chair/wheelchair/BSC Sit to Stand: Min guard   Step pivot transfers: Min guard       General transfer comment: Pt min guard for safety only, no physical assist required or overt LOB noted. Pt used BUE to power up to standing and then was able to step pivot to recliner with min guard assist. Upon sitting in recliner, pt reporting some lightheadedness, BP 143/67. Encouraged pt to eat dinner as it had just arrived and pt had only eaten a few crackers and had previously had a bout of emesis.    Ambulation/Gait               General Gait Details: deferred secondary to nausea and lightheaded ness.  Stairs            Wheelchair Mobility    Modified Rankin (Stroke Patients Only)       Balance Overall balance assessment: Needs assistance Sitting-balance support: Feet supported, No upper extremity supported Sitting balance-Leahy Scale: Fair     Standing balance support: Reliant on assistive device for balance, During functional activity, Bilateral upper extremity supported Standing balance-Leahy Scale: Poor  Pertinent Vitals/Pain Pain Assessment Pain Assessment: 0-10 Pain Score: 2  Pain Location: right knee Pain Descriptors / Indicators: Operative site guarding Pain Intervention(s): Limited activity within patient's tolerance, Monitored during session,  Repositioned, Ice applied    Home Living Family/patient expects to be discharged to:: Private residence Living Arrangements: Children;Spouse/significant other Available Help at Discharge: Family;Available PRN/intermittently (Son lives on the same street) Type of Home: House Home Access: Level entry       Home Layout: One level Home Equipment: BSC/3in1;Rolling Environmental consultant (2 wheels);Cane - single point;Wheelchair - Statistician (4 wheels)      Prior Function Prior Level of Function : Independent/Modified Independent             Mobility Comments: IND ADLs Comments: IND     Hand Dominance        Extremity/Trunk Assessment   Upper Extremity Assessment Upper Extremity Assessment: Overall WFL for tasks assessed    Lower Extremity Assessment Lower Extremity Assessment: RLE deficits/detail;LLE deficits/detail RLE Deficits / Details: MMT ank DF/PF 4/5, no extensor lag noted RLE Sensation: WNL LLE Deficits / Details: MMT ank DF/PF 4/5 LLE Sensation: WNL    Cervical / Trunk Assessment Cervical / Trunk Assessment: Kyphotic  Communication      Cognition Arousal/Alertness: Awake/alert Behavior During Therapy: WFL for tasks assessed/performed Overall Cognitive Status: Within Functional Limits for tasks assessed                                          General Comments      Exercises Total Joint Exercises Ankle Circles/Pumps: AROM, Both, 20 reps, Seated   Assessment/Plan    PT Assessment Patient needs continued PT services  PT Problem List Decreased strength;Decreased range of motion;Decreased activity tolerance;Decreased balance;Decreased mobility;Decreased coordination;Pain       PT Treatment Interventions DME instruction;Gait training;Stair training;Functional mobility training;Therapeutic activities;Therapeutic exercise;Balance training;Patient/family education;Neuromuscular re-education    PT Goals (Current goals can be found in the  Care Plan section)  Acute Rehab PT Goals Patient Stated Goal: mowing my yard using a zero-turn lawnmower PT Goal Formulation: With patient Time For Goal Achievement: 02/02/22 Potential to Achieve Goals: Good    Frequency 7X/week     Co-evaluation               AM-PAC PT "6 Clicks" Mobility  Outcome Measure Help needed turning from your back to your side while in a flat bed without using bedrails?: None Help needed moving from lying on your back to sitting on the side of a flat bed without using bedrails?: A Little Help needed moving to and from a bed to a chair (including a wheelchair)?: A Little Help needed standing up from a chair using your arms (e.g., wheelchair or bedside chair)?: A Little Help needed to walk in hospital room?: A Little Help needed climbing 3-5 steps with a railing? : A Little 6 Click Score: 19    End of Session Equipment Utilized During Treatment: Gait belt Activity Tolerance: No increased pain;Treatment limited secondary to medical complications (Comment) (nausea, lightheadedness) Patient left: in chair;with call bell/phone within reach;with chair alarm set Nurse Communication: Mobility status PT Visit Diagnosis: Pain;Difficulty in walking, not elsewhere classified (R26.2) Pain - Right/Left: Right Pain - part of body: Knee    Time: 2426-8341 PT Time Calculation (min) (ACUTE ONLY): 24 min   Charges:   PT Evaluation $PT Eval Low Complexity: 1 Low PT  Treatments $Therapeutic Activity: 8-22 mins        Coolidge Breeze, PT, DPT [facility] Department Office: [phone] Pager: [phone]  Coolidge Breeze 01/26/2022, 5:03 PM

## 2022-01-26 NOTE — Interval H&P Note (Signed)
History and Physical Interval Note:  01/26/2022 7:05 AM  April Lang  has presented today for surgery, with the diagnosis of right knee osteoarthritis.  The various methods of treatment have been discussed with the patient and family. After consideration of risks, benefits and other options for treatment, the patient has consented to  Procedure(s): TOTAL KNEE ARTHROPLASTY (Right) as a surgical intervention.  The patient's history has been reviewed, patient examined, no change in status, stable for surgery.  I have reviewed the patient's chart and labs.  Questions were answered to the patient's satisfaction.     Pilar Plate 

## 2022-01-26 NOTE — Progress Notes (Signed)
PT Cancellation Note  Patient Details Name: April Lang MRN: [id number] DOB: 03/07/1941   Cancelled Treatment:    Reason Eval/Treat Not Completed: Patient not medically ready (Pt nauseated and vomiting, not responding to zofran.) Attempted to see pt twice but each time pt nauseated and had recently vomited, RN requesting hold therapy for now. Will attempt to follow up as schedule and pt status allows.   Coolidge Breeze, PT, DPT [facility] Department Office: [phone] Pager: [phone]  Coolidge Breeze 01/26/2022, 3:23 PM

## 2022-01-26 NOTE — Anesthesia Procedure Notes (Signed)
Spinal  Start time: 01/26/2022 9:37 AM End time: 01/26/2022 9:39 AM Reason for block: surgical anesthesia Staffing Performed: anesthesiologist  Anesthesiologist: Effie Berkshire, MD Preanesthetic Checklist Completed: patient identified, IV checked, site marked, risks and benefits discussed, surgical consent, monitors and equipment checked, pre-op evaluation and timeout performed Spinal Block Patient position: sitting Prep: DuraPrep and site prepped and draped Location: L3-4 Injection technique: single-shot Needle Needle type: Pencan  Needle gauge: 24 G Needle length: 10 cm Needle insertion depth: 10 cm Assessment Events: second provider Additional Notes Patient tolerated well. No immediate complications.  Functioning IV was confirmed and monitors were applied. Sterile prep and drape, including hand hygiene and sterile gloves were used. The patient was positioned and the back was prepped. The skin was anesthetized with lidocaine. Free flow of clear CSF was obtained prior to injecting local anesthetic into the CSF. The spinal needle aspirated freely following injection. The needle was carefully withdrawn. The patient was diaphoretic after placement of spinal, transient EKG changes but immediate resolution, BP stable, respiratory status stable.

## 2022-01-27 DIAGNOSIS — Z79899 Other long term (current) drug therapy: Secondary | ICD-10-CM | POA: Diagnosis not present

## 2022-01-27 DIAGNOSIS — M1711 Unilateral primary osteoarthritis, right knee: Secondary | ICD-10-CM | POA: Diagnosis not present

## 2022-01-27 DIAGNOSIS — J449 Chronic obstructive pulmonary disease, unspecified: Secondary | ICD-10-CM | POA: Diagnosis not present

## 2022-01-27 DIAGNOSIS — E039 Hypothyroidism, unspecified: Secondary | ICD-10-CM | POA: Diagnosis not present

## 2022-01-27 DIAGNOSIS — J45909 Unspecified asthma, uncomplicated: Secondary | ICD-10-CM | POA: Diagnosis not present

## 2022-01-27 DIAGNOSIS — I10 Essential (primary) hypertension: Secondary | ICD-10-CM | POA: Diagnosis not present

## 2022-01-27 LAB — CBC
HCT: 32.7 % — ABNORMAL LOW (ref 36.0–46.0)
Hemoglobin: 10.9 g/dL — ABNORMAL LOW (ref 12.0–15.0)
MCH: 32.3 pg (ref 26.0–34.0)
MCHC: 33.3 g/dL (ref 30.0–36.0)
MCV: 97 fL (ref 80.0–100.0)
Platelets: 253 10*3/uL (ref 150–400)
RBC: 3.37 MIL/uL — ABNORMAL LOW (ref 3.87–5.11)
RDW: 12.2 % (ref 11.5–15.5)
WBC: 19.6 10*3/uL — ABNORMAL HIGH (ref 4.0–10.5)
nRBC: 0 % (ref 0.0–0.2)

## 2022-01-27 LAB — BASIC METABOLIC PANEL
Anion gap: 9 (ref 5–15)
BUN: 18 mg/dL (ref 8–23)
CO2: 24 mmol/L (ref 22–32)
Calcium: 8.7 mg/dL — ABNORMAL LOW (ref 8.9–10.3)
Chloride: 100 mmol/L (ref 98–111)
Creatinine, Ser: 0.99 mg/dL (ref 0.44–1.00)
GFR, Estimated: 57 mL/min — ABNORMAL LOW (ref >60)
Glucose, Bld: 144 mg/dL — ABNORMAL HIGH (ref 70–99)
Potassium: 4 mmol/L (ref 3.5–5.1)
Sodium: 133 mmol/L — ABNORMAL LOW (ref 135–145)

## 2022-01-27 MED ORDER — TRAMADOL HCL 50 MG PO TABS: 50.0000 mg | ORAL_TABLET | Freq: Four times a day (QID) | ORAL | 0 refills | Status: DC | PRN

## 2022-01-27 MED ORDER — METHOCARBAMOL 500 MG PO TABS: 500.0000 mg | ORAL_TABLET | Freq: Four times a day (QID) | ORAL | 0 refills | Status: DC | PRN

## 2022-01-27 MED ORDER — HYDROCODONE-ACETAMINOPHEN 5-325 MG PO TABS: 1.0000 | ORAL_TABLET | Freq: Four times a day (QID) | ORAL | 0 refills | Status: DC | PRN

## 2022-01-27 MED ORDER — ASPIRIN 325 MG PO TBEC: 325.0000 mg | DELAYED_RELEASE_TABLET | Freq: Two times a day (BID) | ORAL | 0 refills | Status: AC

## 2022-01-27 MED ORDER — ONDANSETRON HCL 4 MG PO TABS: 4.0000 mg | ORAL_TABLET | Freq: Four times a day (QID) | ORAL | 0 refills | Status: DC | PRN

## 2022-01-27 NOTE — TOC Transition Note (Signed)
Transition of Care [facility]) - CM/SW Discharge Note   Patient Details  Name: ROSAURA BOLON MRN: [id number] Date of Birth: Feb 18, 1941  Transition of Care [facility]) CM/SW Contact:  Lennart Pall, LCSW Phone Number: 01/27/2022, 9:46 AM   Clinical Narrative:     Met briefly with pt and confirming she has all needed DME at home.  OPPT prearranged with [facility].  No TOC needs.  Final next level of care: OP Rehab Barriers to Discharge: No Barriers Identified   Patient Goals and CMS Choice Patient states their goals for this hospitalization and ongoing recovery are:: return home      Discharge Placement                       Discharge Plan and Services                DME Arranged: N/A                    Social Determinants of Health (SDOH) Interventions     Readmission Risk Interventions     View : No data to display.

## 2022-01-27 NOTE — Progress Notes (Signed)
Physical Therapy Treatment Patient Details Name: April Lang MRN: [id number] DOB: 11/23/1940 Today's Date: 01/27/2022   History of Present Illness Pt is an 81yo female presenting s/p R-TKA on 01/26/22. PMH: COPD, CAD, HTN, hypothyroidism.    PT Comments    Progressing with mobility. Pt still experiencing some nausea. Pain controlled. Will plan to have a 2nd session to prior to d/c home if pt is feeling well.    Recommendations for follow up therapy are one component of a multi-disciplinary discharge planning process, led by the attending physician.  Recommendations may be updated based on patient status, additional functional criteria and insurance authorization.  Follow Up Recommendations  Follow physician's recommendations for discharge plan and follow up therapies     Assistance Recommended at Discharge Intermittent Supervision/Assistance  Patient can return home with the following A little help with walking and/or transfers;A little help with bathing/dressing/bathroom;Assistance with cooking/housework;Assist for transportation;Help with stairs or ramp for entrance   Equipment Recommendations  None recommended by PT    Recommendations for Other Services       Precautions / Restrictions Precautions Precautions: Fall Weight Bearing Restrictions: No Other Position/Activity Restrictions: WBAT     Mobility  Bed Mobility Overal bed mobility: Needs Assistance Bed Mobility: Supine to Sit     Supine to sit: Supervision     General bed mobility comments: Supv for safety.    Transfers Overall transfer level: Needs assistance Equipment used: Rolling walker (2 wheels) Transfers: Sit to/from Stand Sit to Stand: Min guard           General transfer comment: Min guard for safety. Cues for safety, technique, hand/LE placement    Ambulation/Gait Ambulation/Gait assistance: Min guard Gait Distance (Feet): 75 Feet Assistive device: Rolling walker (2 wheels) Gait  Pattern/deviations: Step-to pattern       General Gait Details: Cues for safety, technique, sequence. Min guard for safety. Some lightheadedness and nausea   Stairs             Wheelchair Mobility    Modified Rankin (Stroke Patients Only)       Balance Overall balance assessment: Needs assistance         Standing balance support: Bilateral upper extremity supported, During functional activity, Reliant on assistive device for balance Standing balance-Leahy Scale: Fair                              Cognition Arousal/Alertness: Awake/alert Behavior During Therapy: WFL for tasks assessed/performed Overall Cognitive Status: Within Functional Limits for tasks assessed                                          Exercises Total Joint Exercises Ankle Circles/Pumps: AROM, Both, 10 reps Quad Sets: AROM, Both, 10 reps Hip ABduction/ADduction: AROM, AAROM, Right, 10 reps Straight Leg Raises: AROM, AAROM, Right, 10 reps Knee Flexion: AROM, Right, 10 reps, Seated Goniometric ROM: ~10-65 degrees    General Comments        Pertinent Vitals/Pain Pain Assessment Pain Assessment: 0-10 Pain Score: 3  Pain Location: right knee Pain Descriptors / Indicators: Operative site guarding Pain Intervention(s): Limited activity within patient's tolerance, Monitored during session, Repositioned    Home Living  Prior Function            PT Goals (current goals can now be found in the care plan section) Progress towards PT goals: Progressing toward goals    Frequency    7X/week      PT Plan Current plan remains appropriate    Co-evaluation              AM-PAC PT "6 Clicks" Mobility   Outcome Measure  Help needed turning from your back to your side while in a flat bed without using bedrails?: None Help needed moving from lying on your back to sitting on the side of a flat bed without using bedrails?:  A Little Help needed moving to and from a bed to a chair (including a wheelchair)?: A Little Help needed standing up from a chair using your arms (e.g., wheelchair or bedside chair)?: A Little Help needed to walk in hospital room?: A Little Help needed climbing 3-5 steps with a railing? : A Little 6 Click Score: 19    End of Session Equipment Utilized During Treatment: Gait belt Activity Tolerance: Patient tolerated treatment well (still having some nausea) Patient left: in chair;with call bell/phone within reach   PT Visit Diagnosis: Pain;Difficulty in walking, not elsewhere classified (R26.2) Pain - Right/Left: Right Pain - part of body: Knee     Time: 0942-1000 PT Time Calculation (min) (ACUTE ONLY): 18 min  Charges:  $Gait Training: 8-22 mins                        Doreatha Massed, PT Acute Rehabilitation  Office: [phone] Pager: [phone]

## 2022-01-27 NOTE — Plan of Care (Signed)
Pt ready to DC home when husband arrives.

## 2022-01-27 NOTE — Progress Notes (Signed)
Orthopedic Tech Progress Note Patient Details:  April Lang 10-Dec-1940 [id number]  Patient ID: [name], female   DOB: Feb 16, 1941, 81 y.o.   MRN: [id number] Patient currently up in chair, will revisit this evening once she is back in bed to apply [place] 01/27/2022, 1:36 PM

## 2022-01-27 NOTE — Progress Notes (Signed)
   Subjective: 1 Day Post-Op Procedure(s) (LRB): TOTAL KNEE ARTHROPLASTY (Right) Patient reports pain as mild.   Patient seen in rounds by Dr. Wynelle Link. Patient is feeling better this morning. Had nausea with the oxycodone, was switched to Norco with improvement. Denies chest pain, SOB, or calf pain. Foley catheter removed this AM.  We will continue therapy today, was unable to ambulate due to nausea.   Objective: Vital signs in last 24 hours: Temp:  [97.4 F (36.3 C)-98.7 F (37.1 C)] 97.9 F (36.6 C) (05/23 0516) Pulse Rate:  [50-74] 69 (05/23 0516) Resp:  [11-25] 18 (05/23 0516) BP: (115-180)/(53-101) 143/58 (05/23 0516) SpO2:  [94 %-100 %] 98 % (05/23 0516) Weight:  [68 kg] 68 kg (05/22 0803)  Intake/Output from previous day:  Intake/Output Summary (Last 24 hours) at 01/27/2022 0720 Last data filed at 01/27/2022 0635 Gross per 24 hour  Intake 2859.42 ml  Output 1725 ml  Net 1134.42 ml     Intake/Output this shift: No intake/output data recorded.  Labs: Recent Labs    01/27/22 0303  HGB 10.9*   Recent Labs    01/27/22 0303  WBC 19.6*  RBC 3.37*  HCT 32.7*  PLT 253   Recent Labs    01/27/22 0303  NA 133*  K 4.0  CL 100  CO2 24  BUN 18  CREATININE 0.99  GLUCOSE 144*  CALCIUM 8.7*   No results for input(s): LABPT, INR in the last 72 hours.  Exam: General - Patient is Alert and Oriented Extremity - Neurologically intact Neurovascular intact Sensation intact distally Dorsiflexion/Plantar flexion intact Dressing - dressing C/D/I Motor Function - intact, moving foot and toes well on exam.   Past Medical History:  Diagnosis Date   Arthritis    Asthma    COPD (chronic obstructive pulmonary disease) (HCC)    Coronary artery disease    Cystocele 01/19/2013   Hypertension    Hypothyroidism    Pre-diabetes    Stress incontinence 01/19/2013   Thyroid disease     Assessment/Plan: 1 Day Post-Op Procedure(s) (LRB): TOTAL KNEE ARTHROPLASTY  (Right) Principal Problem:   OA (osteoarthritis) of knee Active Problems:   Primary osteoarthritis of right knee  Estimated body mass index is 25.75 kg/m as calculated from the following:   Height as of this encounter: '5\' 4"'$  (1.626 m).   Weight as of this encounter: 68 kg. Advance diet Up with therapy D/C IV fluids   Patient's anticipated LOS is less than 2 midnights, meeting these requirements: - Lives within 1 hour of care - Has a competent adult at home to recover with post-op recover - NO history of  - Chronic pain requiring opioids  - Diabetes  - Heart failure  - Heart attack  - Stroke  - DVT/VTE  - Cardiac arrhythmia  - Respiratory Failure  - Renal failure  - Anemia  - Advanced Liver disease  DVT Prophylaxis - Aspirin Weight bearing as tolerated. Continue therapy.  Plan is to go Home after hospital stay. Plan for discharge later today if progresses with therapy and meeting goals. Scheduled for OPPT at [facility]. Follow-up in the office in 2 weeks.  The PDMP database was reviewed today prior to any opioid medications being prescribed to this patient.  Theresa Duty, PA-C Orthopedic Surgery [phone] 01/27/2022, 7:20 AM

## 2022-01-27 NOTE — Progress Notes (Signed)
Physical Therapy Treatment Patient Details Name: April Lang MRN: [id number] DOB: 1941-08-25 Today's Date: 01/27/2022   History of Present Illness Pt is an 81yo female presenting s/p R-TKA on 01/26/22. PMH: COPD, CAD, HTN, hypothyroidism.    PT Comments    Pt still reports some mild nausea and lightheadedness. She tolerated ambulation well. Pain controlled. She wants to d/c home today. All education completed.    Recommendations for follow up therapy are one component of a multi-disciplinary discharge planning process, led by the attending physician.  Recommendations may be updated based on patient status, additional functional criteria and insurance authorization.  Follow Up Recommendations  Follow physician's recommendations for discharge plan and follow up therapies     Assistance Recommended at Discharge Intermittent Supervision/Assistance  Patient can return home with the following A little help with walking and/or transfers;A little help with bathing/dressing/bathroom;Assistance with cooking/housework;Assist for transportation;Help with stairs or ramp for entrance   Equipment Recommendations  None recommended by PT    Recommendations for Other Services       Precautions / Restrictions Precautions Precautions: Fall Restrictions Weight Bearing Restrictions: No Other Position/Activity Restrictions: WBAT     Mobility  Bed Mobility Overal bed mobility: Needs Assistance Bed Mobility: Supine to Sit     Supine to sit: Supervision     General bed mobility comments: oob in recliner    Transfers Overall transfer level: Needs assistance Equipment used: Rolling walker (2 wheels) Transfers: Sit to/from Stand Sit to Stand: Supervision           General transfer comment: Supv for safety.    Ambulation/Gait Ambulation/Gait assistance: Supervision Gait Distance (Feet): 100 Feet Assistive device: Rolling walker (2 wheels) Gait Pattern/deviations: Step-to pattern        General Gait Details: Supv for safety. Some mild lightheadedness and nausea still   Stairs             Wheelchair Mobility    Modified Rankin (Stroke Patients Only)       Balance Overall balance assessment: Mild deficits observed, not formally tested         Standing balance support: Bilateral upper extremity supported, During functional activity, Reliant on assistive device for balance Standing balance-Leahy Scale: Fair                              Cognition Arousal/Alertness: Awake/alert Behavior During Therapy: WFL for tasks assessed/performed Overall Cognitive Status: Within Functional Limits for tasks assessed                                          Exercises    General Comments        Pertinent Vitals/Pain Pain Assessment Pain Assessment: 0-10 Pain Score: 3  Pain Location: right knee Pain Descriptors / Indicators: Operative site guarding Pain Intervention(s): Monitored during session, Repositioned    Home Living                          Prior Function            PT Goals (current goals can now be found in the care plan section) Progress towards PT goals: Progressing toward goals    Frequency    7X/week      PT Plan Current plan remains appropriate    Co-evaluation  AM-PAC PT "6 Clicks" Mobility   Outcome Measure  Help needed turning from your back to your side while in a flat bed without using bedrails?: None Help needed moving from lying on your back to sitting on the side of a flat bed without using bedrails?: A Little Help needed moving to and from a bed to a chair (including a wheelchair)?: A Little Help needed standing up from a chair using your arms (e.g., wheelchair or bedside chair)?: A Little Help needed to walk in hospital room?: A Little Help needed climbing 3-5 steps with a railing? : A Little 6 Click Score: 19    End of Session Equipment Utilized  During Treatment: Gait belt Activity Tolerance: Patient tolerated treatment well Patient left: in chair;with call bell/phone within reach   PT Visit Diagnosis: Pain;Difficulty in walking, not elsewhere classified (R26.2) Pain - Right/Left: Right Pain - part of body: Knee     Time: 0942-1000 PT Time Calculation (min) (ACUTE ONLY): 18 min  Charges:  $Gait Training: 8-22 mins                         Doreatha Massed, PT Acute Rehabilitation  Office: [phone] Pager: [phone]

## 2022-01-28 ENCOUNTER — Encounter (HOSPITAL_COMMUNITY): Payer: Self-pay | Admitting: Orthopedic Surgery

## 2022-01-28 NOTE — Discharge Summary (Signed)
Patient ID: April Lang MRN: [id number] DOB/AGE: March 01, 1941 81 y.o.  Admit date: 01/26/2022 Discharge date: 01/27/2022  Admission Diagnoses:  Principal Problem:   OA (osteoarthritis) of knee Active Problems:   Primary osteoarthritis of right knee   Discharge Diagnoses:  Same  Past Medical History:  Diagnosis Date   Arthritis    Asthma    COPD (chronic obstructive pulmonary disease) ([place])    Coronary artery disease    Cystocele 01/19/2013   Hypertension    Hypothyroidism    Pre-diabetes    Stress incontinence 01/19/2013   Thyroid disease     Surgeries: Procedure(s): TOTAL KNEE ARTHROPLASTY on 01/26/2022   Consultants:   Discharged Condition: Improved  Hospital Course: April Lang is an 81 y.o. female who was admitted 01/26/2022 for operative treatment ofOA (osteoarthritis) of knee. Patient has severe unremitting pain that affects sleep, daily activities, and work/hobbies. After pre-op clearance the patient was taken to the operating room on 01/26/2022 and underwent  Procedure(s): TOTAL KNEE ARTHROPLASTY.    Patient was given perioperative antibiotics:  Anti-infectives (From admission, onward)    Start     Dose/Rate Route Frequency Ordered Stop   01/26/22 1600  ceFAZolin (ANCEF) IVPB 2g/100 mL premix        2 g 200 mL/hr over 30 Minutes Intravenous Every 6 hours 01/26/22 1307 01/27/22 0200   01/26/22 0730  ceFAZolin (ANCEF) IVPB 2g/100 mL premix        2 g 200 mL/hr over 30 Minutes Intravenous On call to O.R. 01/26/22 0720 01/26/22 0955        Patient was given sequential compression devices, early ambulation, and chemoprophylaxis to prevent DVT.  Patient benefited maximally from hospital stay and there were no complications.    Recent vital signs: Patient Vitals for the past 24 hrs:  BP Temp Temp src Pulse Resp SpO2  01/27/22 0930 (!) 144/63 98.1 F (36.7 C) Oral 66 16 97 %     Recent laboratory studies:  Recent Labs    01/27/22 0303  WBC 19.6*   HGB 10.9*  HCT 32.7*  PLT 253  NA 133*  K 4.0  CL 100  CO2 24  BUN 18  CREATININE 0.99  GLUCOSE 144*  CALCIUM 8.7*     Discharge Medications:   Allergies as of 01/27/2022       Reactions   Benadryl [diphenhydramine]    Dizziness    Codeine    Almost passed out.    Lisinopril Cough   Other Rash   Arthritis med-not sure of name        Medication List     TAKE these medications    acetaminophen 500 MG tablet Commonly known as: TYLENOL Take 500-1,000 mg by mouth every 6 (six) hours as needed for pain or moderate pain.   aspirin EC 325 MG tablet Take 1 tablet (325 mg total) by mouth 2 (two) times daily for 20 days. Then take one 81 mg aspirin once a day for three weeks. Then discontinue aspirin.   Clobetasol Prop Emollient Base 0.05 % emollient cream APPLY A THIN LAYER TWICE DAILY FOR 2 WEEKS; THEN 2 TO 3 TIMES A WEEK. What changed:  how much to take how to take this when to take this reasons to take this additional instructions   hydrochlorothiazide 25 MG tablet Commonly known as: HYDRODIURIL TAKE ONE TABLET BY MOUTH DAILY.   HYDROcodone-acetaminophen 5-325 MG tablet Commonly known as: NORCO/VICODIN Take 1-2 tablets by mouth every 6 (six) hours  as needed for severe pain.   levothyroxine 100 MCG tablet Commonly known as: SYNTHROID Take 100 mcg by mouth daily before breakfast.   losartan 50 MG tablet Commonly known as: COZAAR TAKE ONE TABLET BY MOUTH DAILY.   methocarbamol 500 MG tablet Commonly known as: ROBAXIN Take 1 tablet (500 mg total) by mouth every 6 (six) hours as needed for muscle spasms.   Multi + Omega-3 Adult Gummies Chew Chew 2 capsules by mouth daily.   ondansetron 4 MG tablet Commonly known as: ZOFRAN Take 1 tablet (4 mg total) by mouth every 6 (six) hours as needed for nausea.   pravastatin 40 MG tablet Commonly known as: PRAVACHOL TAKE 1 TABLET BY MOUTH DAILY What changed: when to take this   SYSTANE ULTRA OP Place 1 drop  into both eyes daily as needed (dry eyes).   traMADol 50 MG tablet Commonly known as: ULTRAM Take 1-2 tablets (50-100 mg total) by mouth every 6 (six) hours as needed for moderate pain.   Vitamin C 500 MG Chew Chew 500 mg by mouth daily.               Discharge Care Instructions  (From admission, onward)           Start     Ordered   01/27/22 0000  Weight bearing as tolerated        01/27/22 0731   01/27/22 0000  Change dressing       Comments: You may remove the bulky bandage (ACE wrap and gauze) two days after surgery. You will have an adhesive waterproof bandage underneath. Leave this in place until your first follow-up appointment.   01/27/22 0731            Diagnostic Studies: No results found.  Disposition: Discharge disposition: 01-Home or Self Care       Discharge Instructions     Call MD / Call 911   Complete by: As directed    If you experience chest pain or shortness of breath, CALL 911 and be transported to the hospital emergency room.  If you develope a fever above 101 F, pus (white drainage) or increased drainage or redness at the wound, or calf pain, call your surgeon's office.   Change dressing   Complete by: As directed    You may remove the bulky bandage (ACE wrap and gauze) two days after surgery. You will have an adhesive waterproof bandage underneath. Leave this in place until your first follow-up appointment.   Constipation Prevention   Complete by: As directed    Drink plenty of fluids.  Prune juice may be helpful.  You may use a stool softener, such as Colace (over the counter) 100 mg twice a day.  Use MiraLax (over the counter) for constipation as needed.   Diet - low sodium heart healthy   Complete by: As directed    Do not put a pillow under the knee. Place it under the heel.   Complete by: As directed    Driving restrictions   Complete by: As directed    No driving for two weeks   Post-operative opioid taper instructions:    Complete by: As directed    POST-OPERATIVE OPIOID TAPER INSTRUCTIONS: It is important to wean off of your opioid medication as soon as possible. If you do not need pain medication after your surgery it is ok to stop day one. Opioids include: Codeine, Hydrocodone(Norco, Vicodin), Oxycodone(Percocet, oxycontin) and hydromorphone amongst others.  Long term  and even short term use of opiods can cause: Increased pain response Dependence Constipation Depression Respiratory depression And more.  Withdrawal symptoms can include Flu like symptoms Nausea, vomiting And more Techniques to manage these symptoms Hydrate well Eat regular healthy meals Stay active Use relaxation techniques(deep breathing, meditating, yoga) Do Not substitute Alcohol to help with tapering If you have been on opioids for less than two weeks and do not have pain than it is ok to stop all together.  Plan to wean off of opioids This plan should start within one week post op of your joint replacement. Maintain the same interval or time between taking each dose and first decrease the dose.  Cut the total daily intake of opioids by one tablet each day Next start to increase the time between doses. The last dose that should be eliminated is the evening dose.      TED hose   Complete by: As directed    Use stockings (TED hose) for three weeks on both leg(s).  You may remove them at night for sleeping.   Weight bearing as tolerated   Complete by: As directed         Follow-up Information     Gaynelle Arabian, MD. Go on 02/10/2022.   Specialty: Orthopedic Surgery Why: You are scheduled for first post op appointment on Tuesday June 6th at 1:00pm. Contact information: [address] [place] [address] [phone]                  Signed: Theresa Duty 01/28/2022, 7:50 AM

## 2022-01-29 DIAGNOSIS — Z471 Aftercare following joint replacement surgery: Secondary | ICD-10-CM | POA: Diagnosis not present

## 2022-01-29 DIAGNOSIS — M25561 Pain in right knee: Secondary | ICD-10-CM | POA: Diagnosis not present

## 2022-01-29 DIAGNOSIS — R262 Difficulty in walking, not elsewhere classified: Secondary | ICD-10-CM | POA: Diagnosis not present

## 2022-02-03 DIAGNOSIS — Z471 Aftercare following joint replacement surgery: Secondary | ICD-10-CM | POA: Diagnosis not present

## 2022-02-03 DIAGNOSIS — M25561 Pain in right knee: Secondary | ICD-10-CM | POA: Diagnosis not present

## 2022-02-03 DIAGNOSIS — R262 Difficulty in walking, not elsewhere classified: Secondary | ICD-10-CM | POA: Diagnosis not present

## 2022-02-05 DIAGNOSIS — Z471 Aftercare following joint replacement surgery: Secondary | ICD-10-CM | POA: Diagnosis not present

## 2022-02-05 DIAGNOSIS — R262 Difficulty in walking, not elsewhere classified: Secondary | ICD-10-CM | POA: Diagnosis not present

## 2022-02-05 DIAGNOSIS — M25561 Pain in right knee: Secondary | ICD-10-CM | POA: Diagnosis not present

## 2022-02-09 DIAGNOSIS — R262 Difficulty in walking, not elsewhere classified: Secondary | ICD-10-CM | POA: Diagnosis not present

## 2022-02-09 DIAGNOSIS — M25561 Pain in right knee: Secondary | ICD-10-CM | POA: Diagnosis not present

## 2022-02-09 DIAGNOSIS — Z471 Aftercare following joint replacement surgery: Secondary | ICD-10-CM | POA: Diagnosis not present

## 2022-02-11 DIAGNOSIS — M25561 Pain in right knee: Secondary | ICD-10-CM | POA: Diagnosis not present

## 2022-02-11 DIAGNOSIS — Z471 Aftercare following joint replacement surgery: Secondary | ICD-10-CM | POA: Diagnosis not present

## 2022-02-11 DIAGNOSIS — Z96651 Presence of right artificial knee joint: Secondary | ICD-10-CM | POA: Diagnosis not present

## 2022-02-11 DIAGNOSIS — R262 Difficulty in walking, not elsewhere classified: Secondary | ICD-10-CM | POA: Diagnosis not present

## 2022-02-12 ENCOUNTER — Observation Stay (HOSPITAL_COMMUNITY)
Admission: EM | Admit: 2022-02-12 | Discharge: 2022-02-13 | Disposition: A | Discharge status: Home / Self Care | Payer: Medicare Other | Attending: Orthopedic Surgery | Admitting: Orthopedic Surgery

## 2022-02-12 ENCOUNTER — Other Ambulatory Visit: Payer: Self-pay

## 2022-02-12 ENCOUNTER — Emergency Department (HOSPITAL_BASED_OUTPATIENT_CLINIC_OR_DEPARTMENT_OTHER): Payer: Medicare Other | Admitting: Certified Registered"

## 2022-02-12 ENCOUNTER — Emergency Department (HOSPITAL_COMMUNITY): Payer: Medicare Other | Admitting: Certified Registered"

## 2022-02-12 ENCOUNTER — Encounter (HOSPITAL_COMMUNITY): Payer: Self-pay

## 2022-02-12 ENCOUNTER — Emergency Department (HOSPITAL_COMMUNITY): Payer: Medicare Other

## 2022-02-12 ENCOUNTER — Encounter (HOSPITAL_COMMUNITY)
Admission: EM | Disposition: A | Discharge status: Home / Self Care | Payer: Self-pay | Source: Home / Self Care | Attending: Emergency Medicine

## 2022-02-12 DIAGNOSIS — S81001A Unspecified open wound, right knee, initial encounter: Secondary | ICD-10-CM | POA: Diagnosis not present

## 2022-02-12 DIAGNOSIS — I10 Essential (primary) hypertension: Secondary | ICD-10-CM

## 2022-02-12 DIAGNOSIS — J449 Chronic obstructive pulmonary disease, unspecified: Secondary | ICD-10-CM | POA: Diagnosis not present

## 2022-02-12 DIAGNOSIS — Z7982 Long term (current) use of aspirin: Secondary | ICD-10-CM | POA: Insufficient documentation

## 2022-02-12 DIAGNOSIS — J45909 Unspecified asthma, uncomplicated: Secondary | ICD-10-CM | POA: Insufficient documentation

## 2022-02-12 DIAGNOSIS — R600 Localized edema: Secondary | ICD-10-CM | POA: Diagnosis not present

## 2022-02-12 DIAGNOSIS — Z96651 Presence of right artificial knee joint: Secondary | ICD-10-CM | POA: Diagnosis not present

## 2022-02-12 DIAGNOSIS — W19XXXA Unspecified fall, initial encounter: Secondary | ICD-10-CM | POA: Diagnosis not present

## 2022-02-12 DIAGNOSIS — E039 Hypothyroidism, unspecified: Secondary | ICD-10-CM | POA: Diagnosis not present

## 2022-02-12 DIAGNOSIS — Z79899 Other long term (current) drug therapy: Secondary | ICD-10-CM | POA: Diagnosis not present

## 2022-02-12 DIAGNOSIS — R52 Pain, unspecified: Secondary | ICD-10-CM | POA: Diagnosis not present

## 2022-02-12 DIAGNOSIS — T8130XA Disruption of wound, unspecified, initial encounter: Secondary | ICD-10-CM

## 2022-02-12 DIAGNOSIS — T8133XA Disruption of traumatic injury wound repair, initial encounter: Principal | ICD-10-CM

## 2022-02-12 DIAGNOSIS — L7632 Postprocedural hematoma of skin and subcutaneous tissue following other procedure: Secondary | ICD-10-CM | POA: Diagnosis not present

## 2022-02-12 DIAGNOSIS — I2510 Atherosclerotic heart disease of native coronary artery without angina pectoris: Secondary | ICD-10-CM | POA: Insufficient documentation

## 2022-02-12 DIAGNOSIS — R69 Illness, unspecified: Secondary | ICD-10-CM | POA: Diagnosis not present

## 2022-02-12 DIAGNOSIS — T8131XA Disruption of external operation (surgical) wound, not elsewhere classified, initial encounter: Secondary | ICD-10-CM | POA: Diagnosis not present

## 2022-02-12 DIAGNOSIS — T8132XA Disruption of internal operation (surgical) wound, not elsewhere classified, initial encounter: Secondary | ICD-10-CM | POA: Diagnosis not present

## 2022-02-12 HISTORY — PX: INCISION AND DRAINAGE OF WOUND: SHX1803

## 2022-02-12 LAB — BASIC METABOLIC PANEL
Anion gap: 8 (ref 5–15)
BUN: 14 mg/dL (ref 8–23)
CO2: 28 mmol/L (ref 22–32)
Calcium: 8.8 mg/dL — ABNORMAL LOW (ref 8.9–10.3)
Chloride: 99 mmol/L (ref 98–111)
Creatinine, Ser: 0.99 mg/dL (ref 0.44–1.00)
GFR, Estimated: 57 mL/min — ABNORMAL LOW (ref >60)
Glucose, Bld: 122 mg/dL — ABNORMAL HIGH (ref 70–99)
Potassium: 3.4 mmol/L — ABNORMAL LOW (ref 3.5–5.1)
Sodium: 135 mmol/L (ref 135–145)

## 2022-02-12 LAB — CBC WITH DIFFERENTIAL/PLATELET
Abs Immature Granulocytes: 0.04 10*3/uL (ref 0.00–0.07)
Basophils Absolute: 0.1 10*3/uL (ref 0.0–0.1)
Basophils Relative: 1 %
Eosinophils Absolute: 0.1 10*3/uL (ref 0.0–0.5)
Eosinophils Relative: 1 %
HCT: 28.9 % — ABNORMAL LOW (ref 36.0–46.0)
Hemoglobin: 9.7 g/dL — ABNORMAL LOW (ref 12.0–15.0)
Immature Granulocytes: 0 %
Lymphocytes Relative: 4 %
Lymphs Abs: 0.6 10*3/uL — ABNORMAL LOW (ref 0.7–4.0)
MCH: 33.2 pg (ref 26.0–34.0)
MCHC: 33.6 g/dL (ref 30.0–36.0)
MCV: 99 fL (ref 80.0–100.0)
Monocytes Absolute: 0.8 10*3/uL (ref 0.1–1.0)
Monocytes Relative: 5 %
Neutro Abs: 13 10*3/uL — ABNORMAL HIGH (ref 1.7–7.7)
Neutrophils Relative %: 89 %
Platelets: 490 10*3/uL — ABNORMAL HIGH (ref 150–400)
RBC: 2.92 MIL/uL — ABNORMAL LOW (ref 3.87–5.11)
RDW: 13.9 % (ref 11.5–15.5)
WBC: 14.6 10*3/uL — ABNORMAL HIGH (ref 4.0–10.5)
nRBC: 0 % (ref 0.0–0.2)

## 2022-02-12 SURGERY — IRRIGATION AND DEBRIDEMENT WOUND
Anesthesia: General | Laterality: Right

## 2022-02-12 MED ORDER — METOCLOPRAMIDE HCL 5 MG PO TABS: 5.0000 mg | ORAL_TABLET | Freq: Three times a day (TID) | ORAL | Status: DC | PRN

## 2022-02-12 MED ORDER — LACTATED RINGERS IV SOLN
INTRAVENOUS | Status: DC
Start: 2022-02-12 — End: 2022-02-13

## 2022-02-12 MED ORDER — LOSARTAN POTASSIUM 50 MG PO TABS
50.0000 mg | ORAL_TABLET | Freq: Every day | ORAL | Status: DC
  Administered 2022-02-13: 50 mg via ORAL
  Filled 2022-02-12: qty 1

## 2022-02-12 MED ORDER — METHOCARBAMOL 500 MG PO TABS
500.0000 mg | ORAL_TABLET | Freq: Four times a day (QID) | ORAL | Status: DC | PRN
  Administered 2022-02-12 – 2022-02-13 (×2): 500 mg via ORAL
  Filled 2022-02-12 (×2): qty 1

## 2022-02-12 MED ORDER — FENTANYL CITRATE PF 50 MCG/ML IJ SOSY
50.0000 ug | PREFILLED_SYRINGE | Freq: Once | INTRAMUSCULAR | Status: AC
  Administered 2022-02-12: 50 ug via INTRAVENOUS
  Filled 2022-02-12: qty 1

## 2022-02-12 MED ORDER — METOCLOPRAMIDE HCL 5 MG/ML IJ SOLN
5.0000 mg | Freq: Three times a day (TID) | INTRAMUSCULAR | Status: DC | PRN
  Administered 2022-02-13: 10 mg via INTRAVENOUS
  Filled 2022-02-12: qty 2

## 2022-02-12 MED ORDER — DEXAMETHASONE SODIUM PHOSPHATE 10 MG/ML IJ SOLN
INTRAMUSCULAR | Status: AC
  Filled 2022-02-12: qty 1

## 2022-02-12 MED ORDER — FENTANYL CITRATE (PF) 100 MCG/2ML IJ SOLN
INTRAMUSCULAR | Status: DC | PRN
  Administered 2022-02-12 (×2): 50 ug via INTRAVENOUS

## 2022-02-12 MED ORDER — ONDANSETRON HCL 4 MG/2ML IJ SOLN
INTRAMUSCULAR | Status: DC | PRN
  Administered 2022-02-12: 4 mg via INTRAVENOUS

## 2022-02-12 MED ORDER — ONDANSETRON HCL 4 MG/2ML IJ SOLN
4.0000 mg | Freq: Once | INTRAMUSCULAR | Status: AC
  Administered 2022-02-12: 4 mg via INTRAVENOUS
  Filled 2022-02-12: qty 2

## 2022-02-12 MED ORDER — CEFAZOLIN SODIUM-DEXTROSE 1-4 GM/50ML-% IV SOLN
1.0000 g | Freq: Four times a day (QID) | INTRAVENOUS | Status: AC
  Administered 2022-02-12 – 2022-02-13 (×3): 1 g via INTRAVENOUS
  Filled 2022-02-12 (×3): qty 50

## 2022-02-12 MED ORDER — HYDROCHLOROTHIAZIDE 25 MG PO TABS
25.0000 mg | ORAL_TABLET | Freq: Every day | ORAL | Status: DC
  Administered 2022-02-13: 25 mg via ORAL
  Filled 2022-02-12: qty 1

## 2022-02-12 MED ORDER — FENTANYL CITRATE PF 50 MCG/ML IJ SOSY
PREFILLED_SYRINGE | INTRAMUSCULAR | Status: AC
  Filled 2022-02-12: qty 1

## 2022-02-12 MED ORDER — ONDANSETRON HCL 4 MG PO TABS: 4.0000 mg | ORAL_TABLET | Freq: Four times a day (QID) | ORAL | Status: DC | PRN

## 2022-02-12 MED ORDER — FENTANYL CITRATE PF 50 MCG/ML IJ SOSY
25.0000 ug | PREFILLED_SYRINGE | INTRAMUSCULAR | Status: AC | PRN
  Administered 2022-02-12: 25 ug via INTRAVENOUS
  Administered 2022-02-12: 12.5 ug via INTRAVENOUS
  Administered 2022-02-12: 25 ug via INTRAVENOUS
  Administered 2022-02-12 (×2): 12.5 ug via INTRAVENOUS

## 2022-02-12 MED ORDER — SODIUM CHLORIDE 0.9 % IV BOLUS
1000.0000 mL | Freq: Once | INTRAVENOUS | Status: AC
  Administered 2022-02-12: 1000 mL via INTRAVENOUS

## 2022-02-12 MED ORDER — PROPOFOL 10 MG/ML IV BOLUS
INTRAVENOUS | Status: AC
  Filled 2022-02-12: qty 20

## 2022-02-12 MED ORDER — PHENYLEPHRINE HCL-NACL 20-0.9 MG/250ML-% IV SOLN
INTRAVENOUS | Status: DC | PRN
  Administered 2022-02-12: 125 ug/min via INTRAVENOUS

## 2022-02-12 MED ORDER — HYDROCODONE-ACETAMINOPHEN 5-325 MG PO TABS
1.0000 | ORAL_TABLET | ORAL | Status: DC | PRN
  Administered 2022-02-13 (×2): 2 via ORAL
  Filled 2022-02-12 (×2): qty 2

## 2022-02-12 MED ORDER — FENTANYL CITRATE PF 50 MCG/ML IJ SOSY
PREFILLED_SYRINGE | INTRAMUSCULAR | Status: AC
  Administered 2022-02-12: 12.5 ug via INTRAVENOUS
  Filled 2022-02-12: qty 1

## 2022-02-12 MED ORDER — ONDANSETRON HCL 4 MG/2ML IJ SOLN: 4.0000 mg | Freq: Four times a day (QID) | INTRAMUSCULAR | Status: DC | PRN

## 2022-02-12 MED ORDER — PHENYLEPHRINE HCL (PRESSORS) 10 MG/ML IV SOLN
INTRAVENOUS | Status: AC
  Filled 2022-02-12: qty 1

## 2022-02-12 MED ORDER — ACETAMINOPHEN 500 MG PO TABS
500.0000 mg | ORAL_TABLET | Freq: Four times a day (QID) | ORAL | Status: DC
  Administered 2022-02-12 – 2022-02-13 (×2): 500 mg via ORAL
  Filled 2022-02-12 (×2): qty 1

## 2022-02-12 MED ORDER — SUCCINYLCHOLINE CHLORIDE 200 MG/10ML IV SOSY
PREFILLED_SYRINGE | INTRAVENOUS | Status: DC | PRN
  Administered 2022-02-12: 100 mg via INTRAVENOUS

## 2022-02-12 MED ORDER — CEFAZOLIN SODIUM-DEXTROSE 2-4 GM/100ML-% IV SOLN
2.0000 g | Freq: Once | INTRAVENOUS | Status: AC
  Administered 2022-02-12: 2 g via INTRAVENOUS
  Filled 2022-02-12: qty 100

## 2022-02-12 MED ORDER — PRAVASTATIN SODIUM 20 MG PO TABS
40.0000 mg | ORAL_TABLET | Freq: Every day | ORAL | Status: DC
  Administered 2022-02-12: 40 mg via ORAL
  Filled 2022-02-12: qty 2

## 2022-02-12 MED ORDER — DOCUSATE SODIUM 100 MG PO CAPS
100.0000 mg | ORAL_CAPSULE | Freq: Two times a day (BID) | ORAL | Status: DC
  Administered 2022-02-12 – 2022-02-13 (×2): 100 mg via ORAL
  Filled 2022-02-12 (×2): qty 1

## 2022-02-12 MED ORDER — LACTATED RINGERS IV SOLN: INTRAVENOUS | Status: DC | PRN

## 2022-02-12 MED ORDER — FENTANYL CITRATE (PF) 100 MCG/2ML IJ SOLN
INTRAMUSCULAR | Status: AC
  Filled 2022-02-12: qty 2

## 2022-02-12 MED ORDER — LEVOTHYROXINE SODIUM 100 MCG PO TABS
100.0000 ug | ORAL_TABLET | Freq: Every day | ORAL | Status: DC
  Administered 2022-02-13: 100 ug via ORAL
  Filled 2022-02-12: qty 1

## 2022-02-12 MED ORDER — PROPOFOL 10 MG/ML IV BOLUS
INTRAVENOUS | Status: DC | PRN
  Administered 2022-02-12: 70 mg via INTRAVENOUS

## 2022-02-12 MED ORDER — LIDOCAINE HCL (PF) 2 % IJ SOLN
INTRAMUSCULAR | Status: AC
  Filled 2022-02-12: qty 5

## 2022-02-12 MED ORDER — ONDANSETRON HCL 4 MG/2ML IJ SOLN
INTRAMUSCULAR | Status: AC
  Filled 2022-02-12: qty 2

## 2022-02-12 MED ORDER — ACETAMINOPHEN 325 MG PO TABS: 325.0000 mg | ORAL_TABLET | Freq: Four times a day (QID) | ORAL | Status: DC | PRN

## 2022-02-12 MED ORDER — LIDOCAINE 2% (20 MG/ML) 5 ML SYRINGE
INTRAMUSCULAR | Status: DC | PRN
  Administered 2022-02-12: 50 mg via INTRAVENOUS

## 2022-02-12 MED ORDER — FENTANYL CITRATE PF 50 MCG/ML IJ SOSY
PREFILLED_SYRINGE | INTRAMUSCULAR | Status: AC
  Administered 2022-02-12: 25 ug via INTRAVENOUS
  Filled 2022-02-12: qty 1

## 2022-02-12 SURGICAL SUPPLY — 44 items
APL PRP STRL LF DISP 70% ISPRP (MISCELLANEOUS) ×1
BAG COUNTER SPONGE SURGICOUNT (BAG) IMPLANT
BAG SPEC THK2 15X12 ZIP CLS (MISCELLANEOUS) ×1
BAG SPNG CNTER NS LX DISP (BAG)
BAG ZIPLOCK 12X15 (MISCELLANEOUS) ×2 IMPLANT
BANDAGE ESMARK 6X9 LF (GAUZE/BANDAGES/DRESSINGS) ×1 IMPLANT
BNDG CMPR 9X6 STRL LF SNTH (GAUZE/BANDAGES/DRESSINGS) ×1
BNDG ESMARK 6X9 LF (GAUZE/BANDAGES/DRESSINGS) ×2
BNDG GAUZE ELAST 4 BULKY (GAUZE/BANDAGES/DRESSINGS) ×2 IMPLANT
CHLORAPREP W/TINT 26 (MISCELLANEOUS) ×2 IMPLANT
COVER SURGICAL LIGHT HANDLE (MISCELLANEOUS) ×2 IMPLANT
CUFF TOURN SGL QUICK 18X4 (TOURNIQUET CUFF) IMPLANT
CUFF TOURN SGL QUICK 24 (TOURNIQUET CUFF)
CUFF TOURN SGL QUICK 34 (TOURNIQUET CUFF)
CUFF TRNQT CYL 24X4X16.5-23 (TOURNIQUET CUFF) IMPLANT
CUFF TRNQT CYL 34X4.125X (TOURNIQUET CUFF) IMPLANT
DRAIN PENROSE 0.5X18 (DRAIN) ×2 IMPLANT
DRESSING PEEL AND PLAC PRVNA20 (GAUZE/BANDAGES/DRESSINGS) IMPLANT
DRSG PAD ABDOMINAL 8X10 ST (GAUZE/BANDAGES/DRESSINGS) ×4 IMPLANT
DRSG PEEL AND PLACE PREVENA 20 (GAUZE/BANDAGES/DRESSINGS) ×2
ELECT REM PT RETURN 15FT ADLT (MISCELLANEOUS) ×2 IMPLANT
GAUZE SPONGE 4X4 12PLY STRL (GAUZE/BANDAGES/DRESSINGS) ×2 IMPLANT
GLOVE BIO SURGEON STRL SZ7 (GLOVE) ×2 IMPLANT
GLOVE ORTHO TXT STRL SZ7.5 (GLOVE) ×2 IMPLANT
GOWN STRL REUS W/ TWL XL LVL3 (GOWN DISPOSABLE) ×1 IMPLANT
GOWN STRL REUS W/TWL XL LVL3 (GOWN DISPOSABLE) ×2
HANDPIECE INTERPULSE COAX TIP (DISPOSABLE) ×2
KIT BASIN OR (CUSTOM PROCEDURE TRAY) ×2 IMPLANT
KIT DRSG PREVENA PLUS 7DAY 125 (MISCELLANEOUS) ×1 IMPLANT
KIT TURNOVER KIT A (KITS) IMPLANT
MANIFOLD NEPTUNE II (INSTRUMENTS) ×2 IMPLANT
PACK ORTHO EXTREMITY (CUSTOM PROCEDURE TRAY) ×2 IMPLANT
PAD CAST 4YDX4 CTTN HI CHSV (CAST SUPPLIES) ×1 IMPLANT
PADDING CAST COTTON 4X4 STRL (CAST SUPPLIES) ×2
PENCIL SMOKE EVACUATOR (MISCELLANEOUS) IMPLANT
PROTECTOR NERVE ULNAR (MISCELLANEOUS) ×2 IMPLANT
SET HNDPC FAN SPRY TIP SCT (DISPOSABLE) ×1 IMPLANT
SUT BONE WAX W31G (SUTURE) ×2 IMPLANT
SUT PDS AB 1 CTX 36 (SUTURE) ×2 IMPLANT
SWAB COLLECTION DEVICE MRSA (MISCELLANEOUS) ×2 IMPLANT
SWAB CULTURE ESWAB REG 1ML (MISCELLANEOUS) ×2 IMPLANT
SYR CONTROL 10ML LL (SYRINGE) ×2 IMPLANT
TOWEL OR 17X26 10 PK STRL BLUE (TOWEL DISPOSABLE) ×4 IMPLANT
YANKAUER SUCT BULB TIP 10FT TU (MISCELLANEOUS) ×1 IMPLANT

## 2022-02-12 NOTE — ED Provider Notes (Signed)
[facility] DEPT Provider Note   CSN: [id number] Arrival date & time: 02/12/22  1426     History  Chief Complaint  Patient presents with   Knee Pain   Knee Injury   Fall    April Lang is a 81 y.o. female.  HPI Patient with history notable for right knee arthroplasty performed 2 weeks ago presents after a fall with pain in the knee, active bleeding from a large defect.  Just prior to EMS transport the patient was knocked to the ground accidentally, split open the wound repair.  Since that time she has had severe pain in the area, no distal loss of sensation or weakness.  She sustained no other injuries.  She is here with a female companion assists with the history.    Home Medications Prior to Admission medications   Medication Sig Start Date End Date Taking? Authorizing Provider  acetaminophen (TYLENOL) 500 MG tablet Take 500-1,000 mg by mouth every 6 (six) hours as needed for pain or moderate pain.    [provider]  Ascorbic Acid (VITAMIN C) 500 MG CHEW Chew 500 mg by mouth daily.    [provider]  aspirin EC 325 MG tablet Take 1 tablet (325 mg total) by mouth 2 (two) times daily for 20 days. Then take one 81 mg aspirin once a day for three weeks. Then discontinue aspirin. 01/27/22 02/16/22  Edmisten, Ok Anis, PA  Clobetasol Prop Emollient Base 0.05 % emollient cream APPLY A THIN LAYER TWICE DAILY FOR 2 WEEKS; THEN 2 TO 3 TIMES A WEEK. Patient taking differently: Apply 1 application. topically daily as needed (rash). 10/14/20   Estill Dooms, NP  hydrochlorothiazide (HYDRODIURIL) 25 MG tablet TAKE ONE TABLET BY MOUTH DAILY. 01/12/22   Lorretta Harp, MD  HYDROcodone-acetaminophen (NORCO/VICODIN) 5-325 MG tablet Take 1-2 tablets by mouth every 6 (six) hours as needed for severe pain. 01/27/22   Edmisten, Ok Anis, PA  levothyroxine (SYNTHROID, LEVOTHROID) 100 MCG tablet Take 100 mcg by mouth daily before breakfast.     [provider]  losartan (COZAAR) 50 MG tablet TAKE ONE TABLET BY MOUTH DAILY. 01/12/22   Lorretta Harp, MD  methocarbamol (ROBAXIN) 500 MG tablet Take 1 tablet (500 mg total) by mouth every 6 (six) hours as needed for muscle spasms. 01/27/22   Edmisten, Ok Anis, PA  Multiple Vitamins-Minerals (MULTI + OMEGA-3 ADULT GUMMIES) CHEW Chew 2 capsules by mouth daily.    [provider]  ondansetron (ZOFRAN) 4 MG tablet Take 1 tablet (4 mg total) by mouth every 6 (six) hours as needed for nausea. 01/27/22   Edmisten, Ok Anis, PA  Polyethyl Glycol-Propyl Glycol (SYSTANE ULTRA OP) Place 1 drop into both eyes daily as needed (dry eyes).    [provider]  pravastatin (PRAVACHOL) 40 MG tablet TAKE 1 TABLET BY MOUTH DAILY Patient taking differently: Take 40 mg by mouth at bedtime. 10/17/21   Lorretta Harp, MD  traMADol (ULTRAM) 50 MG tablet Take 1-2 tablets (50-100 mg total) by mouth every 6 (six) hours as needed for moderate pain. 01/27/22   Edmisten, Kristie L, PA      Allergies    Benadryl [diphenhydramine], Codeine, Lisinopril, and Other    Review of Systems   Review of Systems  All other systems reviewed and are negative.   Physical Exam Updated Vital Signs BP (!) 147/57   Pulse 75   Temp 98.1 F (36.7 C) (Oral)   Resp  19   Ht '5\' 4"'$  (1.626 m)   Wt 68 kg   SpO2 99%   BMI 25.73 kg/m  Physical Exam Vitals and nursing note reviewed.  Constitutional:      General: She is not in acute distress.    Appearance: She is well-developed.  HENT:     Head: Normocephalic and atraumatic.  Eyes:     Conjunctiva/sclera: Conjunctivae normal.  Cardiovascular:     Rate and Rhythm: Normal rate and regular rhythm.  Pulmonary:     Effort: Pulmonary effort is normal. No respiratory distress.     Breath sounds: Normal breath sounds. No stridor.  Abdominal:     General: There is no distension.  Musculoskeletal:       Legs:  Skin:    General: Skin is warm and dry.   Neurological:     Mental Status: She is alert and oriented to person, place, and time.     Cranial Nerves: No cranial nerve deficit.  Psychiatric:        Mood and Affect: Mood normal.     ED Results / Procedures / Treatments   Labs (all labs ordered are listed, but only abnormal results are displayed) Labs Reviewed  CBC WITH DIFFERENTIAL/PLATELET - Abnormal; Notable for the following components:      Result Value   WBC 14.6 (*)    RBC 2.92 (*)    Hemoglobin 9.7 (*)    HCT 28.9 (*)    Platelets 490 (*)    Neutro Abs 13.0 (*)    Lymphs Abs 0.6 (*)    All other components within normal limits  BASIC METABOLIC PANEL    EKG None  Radiology DG Knee Complete 4 Views Right  Result Date: 02/12/2022 CLINICAL DATA:  Fall.  Prior knee arthroplasty on 01/26/2022. EXAM: RIGHT KNEE - COMPLETE 4+ VIEW COMPARISON:  None Available. FINDINGS: Status post knee arthroplasty. Anterior subcutaneous edema and minimal subcutaneous gas. No acute fracture or dislocation. IMPRESSION: Total knee arthroplasty, without acute osseous abnormality. Anterior subcutaneous edema and minimal gas is likely the site of soft tissue injury. A portion of this appearance could be postoperative. Electronically Signed   By: Abigail Miyamoto M.D.   On: 02/12/2022 15:59    Procedures Procedures    Medications Ordered in ED Medications  sodium chloride 0.9 % bolus 1,000 mL (1,000 mLs Intravenous New Bag/Given 02/12/22 1548)  fentaNYL (SUBLIMAZE) injection 50 mcg (50 mcg Intravenous Given 02/12/22 1541)  ondansetron (ZOFRAN) injection 4 mg (4 mg Intravenous Given 02/12/22 1536)    ED Course/ Medical Decision Making/ A&P This patient with a Hx of hypertension, recent knee replacement presents to the ED for concern of knee pain following a fall, this involves an extensive number of treatment options, and is a complaint that carries with it a high risk of complications and morbidity.    The differential diagnosis includes  disruption of arthroplasty, laceration, soft tissue defect   Social Determinants of Health:  Age  Additional history obtained:  Additional history and/or information obtained from female companion for HPI, and chart review, notable for surgical notes unremarkable arthroplasty 2 weeks ago   After the initial evaluation, orders, including: X-ray labs IV narcotics fluids antiemetics antibiotics were initiated.   Patient placed on Cardiac and Pulse-Oximetry Monitors. The patient was maintained on a cardiac monitor.  The cardiac monitored showed an rhythm of 75 sinus normal The patient was also maintained on pulse oximetry. The readings were typically 100% room air normal  On repeat evaluation of the patient stayed the same  Lab Tests:  I personally interpreted labs.  The pertinent results include: Leukocytosis, diminished since admission  Imaging Studies ordered:  I independently visualized and interpreted imaging which showed no disruption of the arthroplasty, substantial soft tissue defect I agree with the radiologist interpretation  Consultations Obtained:  I requested consultation with the pedis, Dr. Rolena Infante,  and discussed lab and imaging findings as well as pertinent plan - they recommend: Dr. Wynelle Link is planning for the OR cleanout of the patient's traumatic dehiscence of her recent surgical wound.  Last oral intake was about 5 hours ago, patient is amenable to this process, as is her companion.  Dispostion / Final MDM:  After consideration of the diagnostic results and the patient's response to treatment, adult female with multiple medical issues, most notably recent knee replacement presents after fall with traumatic dehiscence of her surgical wound.  Arthroplasty grossly intact, distally she is neurovascularly unremarkable, no evidence for bacteremia, sepsis though she were received prophylactic antibiotics, IV narcotics, she required admission to our orthopedic colleagues  for definitive care.  Final Clinical Impression(s) / ED Diagnoses Final diagnoses:  Fall, initial encounter  Traumatic wound dehiscence, initial encounter     Carmin Muskrat, MD 02/12/22 1752

## 2022-02-12 NOTE — ED Triage Notes (Addendum)
Pt bib EMS from home for trip and fall.  Pt hit right knee on ground, pt previous reconstruction knee surgery, laceration split open d/t fall.  Pt also c/o lower back pain. Knee wrapped w/ bulky gauze dressing, bleeding controlled but blood soaking through ems gauze wrap.  Pt denies hitting head or blood thinners.

## 2022-02-12 NOTE — Anesthesia Procedure Notes (Signed)
Procedure Name: Intubation Date/Time: 02/12/2022 7:10 PM  Performed by: Cynda Familia, CRNAPre-anesthesia Checklist: Patient identified, Emergency Drugs available, Suction available and Patient being monitored Patient Re-evaluated:Patient Re-evaluated prior to induction Oxygen Delivery Method: Circle System Utilized Preoxygenation: Pre-oxygenation with 100% oxygen Induction Type: IV induction, Rapid sequence and Cricoid Pressure applied Ventilation: Mask ventilation without difficulty Laryngoscope Size: Miller and 2 Grade View: Grade I Tube type: Oral Number of attempts: 1 Airway Equipment and Method: Stylet Placement Confirmation: ETT inserted through vocal cords under direct vision, positive ETCO2 and breath sounds checked- equal and bilateral Secured at: 22 cm Tube secured with: Tape Dental Injury: Teeth and Oropharynx as per pre-operative assessment  Difficulty Due To: Difficulty was anticipated, Difficult Airway- due to anterior larynx, Difficult Airway- due to limited oral opening and Difficult Airway- due to dentition Future Recommendations: Recommend- induction with short-acting agent, and alternative techniques readily available Comments: DIFFICULT INTUBATION-- CONSIDER GLIDESCOPE INTUBATION.Marland Kitchen [name] IV induction-- intubation AM CRNA atraumatic- anterior-- bilat BS [name]--- teeth and mouth as preop

## 2022-02-12 NOTE — Op Note (Signed)
OPERATIVE REPORT  DATE OF SURGERY: 02/12/2022  PATIENT NAME:  April Lang MRN: [id number] DOB: January 05, 1941  PCP: Sharilyn Sites, MD  PRE-OPERATIVE DIAGNOSIS: Right total knee wound dehiscence  POST-OPERATIVE DIAGNOSIS: Same  PROCEDURE:   I&D and wound closure right knee application of Prevena wound VAC  SURGEON:  Melina Schools, MD  PHYSICIAN ASSISTANT: None  ANESTHESIA:   General  EBL: Minimal   Complications: None  BRIEF HISTORY: April Lang is a 81 y.o. female who fell and struck her knee against concrete.  She had a complete dehiscence of her wound.  Approximately 10 inches in length.  As result of the wound dehiscence she was taken to the operating room for formal I&D and wound closure.     PROCEDURE DETAILS: Patient was brought into the operating room and was properly positioned on the operating room table.  After induction with general anesthesia the patient was endotracheally intubated.  A timeout was taken to confirm all important data: including patient, procedure, and the level. Teds, SCD's were applied.   The right knee and lower extremity was prepped and draped in a standard fashion.  The large hematoma was removed and the wound was inspected.  There did not appear to be any violation of the capsule, patellar/quadriceps tendon.  The wound was then irrigated with 6 L of pulse lavage fluid.  There is no active bleeding noted.  We will sutures were removed and the wound was gently debrided.  I then closed the wound with a running zero #1 PDS vertical mattress suture.  The Praveena external wound VAC was then applied and connected to suction and was functioning.  Patient was then extubated transfer the PACU without incident.  Should be admitted for 24-hour IV antibiotics and then discharged home.  Melina Schools, MD 02/12/2022 7:58 PM

## 2022-02-12 NOTE — Anesthesia Preprocedure Evaluation (Addendum)
Anesthesia Evaluation  Patient identified by MRN, date of birth, ID band Patient awake    Reviewed: Allergy & Precautions, H&P , NPO status , Patient's Chart, lab work & pertinent test results  Airway Mallampati: III  TM Distance: >3 FB Neck ROM: Full    Dental no notable dental hx. (+) Teeth Intact, Dental Advisory Given   Pulmonary asthma , COPD,    Pulmonary exam normal breath sounds clear to auscultation       Cardiovascular hypertension, Pt. on medications + CAD and + Peripheral Vascular Disease   Rhythm:Regular Rate:Normal     Neuro/Psych Anxiety negative neurological ROS     GI/Hepatic Neg liver ROS, GERD  ,  Endo/Other  Hypothyroidism   Renal/GU negative Renal ROS  negative genitourinary   Musculoskeletal  (+) Arthritis , Osteoarthritis,    Abdominal   Peds  Hematology negative hematology ROS (+)   Anesthesia Other Findings   Reproductive/Obstetrics negative OB ROS                            Anesthesia Physical Anesthesia Plan  ASA: 3 and emergent  Anesthesia Plan: General   Post-op Pain Management:    Induction: Intravenous, Rapid sequence and Cricoid pressure planned  PONV Risk Score and Plan: 4 or greater and Ondansetron and Treatment may vary due to age or medical condition  Airway Management Planned: Oral ETT  Additional Equipment:   Intra-op Plan:   Post-operative Plan: Extubation in OR  Informed Consent: I have reviewed the patients History and Physical, chart, labs and discussed the procedure including the risks, benefits and alternatives for the proposed anesthesia with the patient or authorized representative who has indicated his/her understanding and acceptance.     Dental advisory given  Plan Discussed with: CRNA  Anesthesia Plan Comments:         Anesthesia Quick Evaluation

## 2022-02-12 NOTE — H&P (Signed)
History:  Patient with history notable for right knee arthroplasty performed 2 weeks ago presents after a fall with pain in the knee, active bleeding from a large defect.  Just prior to EMS transport the patient was knocked to the ground accidentally, split open the wound repair.  Since that time she has had severe pain in the area, no distal loss of sensation or weakness.  She sustained no other injuries.  She is here with a female companion assists with the history.  Past Medical History:  Diagnosis Date   Arthritis    Asthma    COPD (chronic obstructive pulmonary disease) ([place])    Coronary artery disease    Cystocele 01/19/2013   Hypertension    Hypothyroidism    Pre-diabetes    Stress incontinence 01/19/2013   Thyroid disease     Allergies  Allergen Reactions   Benadryl [Diphenhydramine]     Dizziness    Codeine     Almost passed out.    Lisinopril Cough   Other Rash    Arthritis med-not sure of name    No current facility-administered medications on file prior to encounter.   Current Outpatient Medications on File Prior to Encounter  Medication Sig Dispense Refill   aspirin EC 325 MG tablet Take 1 tablet (325 mg total) by mouth 2 (two) times daily for 20 days. Then take one 81 mg aspirin once a day for three weeks. Then discontinue aspirin. 40 tablet 0   hydrochlorothiazide (HYDRODIURIL) 25 MG tablet TAKE ONE TABLET BY MOUTH DAILY. 90 tablet 0   levothyroxine (SYNTHROID, LEVOTHROID) 100 MCG tablet Take 100 mcg by mouth daily before breakfast.     losartan (COZAAR) 50 MG tablet TAKE ONE TABLET BY MOUTH DAILY. 90 tablet 0   traMADol (ULTRAM) 50 MG tablet Take 1-2 tablets (50-100 mg total) by mouth every 6 (six) hours as needed for moderate pain. 40 tablet 0   acetaminophen (TYLENOL) 500 MG tablet Take 500-1,000 mg by mouth every 6 (six) hours as needed for pain or moderate pain.     Ascorbic Acid (VITAMIN C) 500 MG CHEW Chew 500 mg by mouth daily.     Clobetasol  Prop Emollient Base 0.05 % emollient cream APPLY A THIN LAYER TWICE DAILY FOR 2 WEEKS; THEN 2 TO 3 TIMES A WEEK. (Patient taking differently: Apply 1 application. topically daily as needed (rash).) 30 g 0   HYDROcodone-acetaminophen (NORCO/VICODIN) 5-325 MG tablet Take 1-2 tablets by mouth every 6 (six) hours as needed for severe pain. 42 tablet 0   methocarbamol (ROBAXIN) 500 MG tablet Take 1 tablet (500 mg total) by mouth every 6 (six) hours as needed for muscle spasms. 40 tablet 0   Multiple Vitamins-Minerals (MULTI + OMEGA-3 ADULT GUMMIES) CHEW Chew 2 capsules by mouth daily.     ondansetron (ZOFRAN) 4 MG tablet Take 1 tablet (4 mg total) by mouth every 6 (six) hours as needed for nausea. 20 tablet 0   Polyethyl Glycol-Propyl Glycol (SYSTANE ULTRA OP) Place 1 drop into both eyes daily as needed (dry eyes).     pravastatin (PRAVACHOL) 40 MG tablet TAKE 1 TABLET BY MOUTH DAILY (Patient taking differently: Take 40 mg by mouth at bedtime.) 90 tablet 3    Physical Exam: Vitals:   02/12/22 1715 02/12/22 1809  BP: (!) 164/62 (!) 156/60  Pulse: 87 89  Resp: 17 20  Temp:  98.5 F (36.9 C)  SpO2: 97% 97%   Body mass  index is 26.09 kg/m. She is alert and oriented x3. No shortness of breath or chest pain.  Lungs clear to auscultation bilaterally.   Abdomen is soft and nontender Compartments are soft and nontender bilaterally.  2+ dorsalis pedis/posterior tibialis pulses. Right knee wound: Complete dehiscence of her anterior total knee arthroplasty incision.  Positive clot/hematoma formation but there is active venous bleeding.  Wound does probe down to the deep fascia/patella tendon  Patient is able to actively extend the right knee but is limited due to pain.  No purulent drainage is noted.  No erythema.  Image: DG Knee Complete 4 Views Right  Result Date: 02/12/2022 CLINICAL DATA:  Fall.  Prior knee arthroplasty on 01/26/2022. EXAM: RIGHT KNEE - COMPLETE 4+ VIEW COMPARISON:  None Available.  FINDINGS: Status post knee arthroplasty. Anterior subcutaneous edema and minimal subcutaneous gas. No acute fracture or dislocation. IMPRESSION: Total knee arthroplasty, without acute osseous abnormality. Anterior subcutaneous edema and minimal gas is likely the site of soft tissue injury. A portion of this appearance could be postoperative. Electronically Signed   By: Abigail Miyamoto M.D.   On: 02/12/2022 15:59    A/P: Patient is a pleasant 81 year old woman who was knocked down and fell onto concrete patio and had a complete knee wound dehiscence.  Patient is now 2 weeks out from a total knee arthroplasty.  At this point time I recommend a formal I&D and wound closure.  I did discuss this with Dr. Wynelle Link and he is in agreement.  Patient will be admitted for 24 hours of antibiotics be discharged and follow-up with Dr. Wynelle Link.  I discussed the risks, benefits, and alternatives of surgery with the patient and all their questions were addressed.  Plan on formal I&D and wound closure.

## 2022-02-12 NOTE — Transfer of Care (Signed)
Immediate Anesthesia Transfer of Care Note  Patient: April Lang  Procedure(s) Performed: IRRIGATION AND DEBRIDEMENT WOUND, RIGHT KNEE (Right)  Patient Location: PACU  Anesthesia Type:General  Level of Consciousness: awake and alert   Airway & Oxygen Therapy: Patient Spontanous Breathing and Patient connected to face mask oxygen  Post-op Assessment: Report given to RN and Post -op Vital signs reviewed and stable  Post vital signs: Reviewed and stable  Last Vitals:  Vitals Value Taken Time  BP 123/92 02/12/22 2003  Temp 36.8 C 02/12/22 2000  Pulse 71 02/12/22 2006  Resp 16 02/12/22 2006  SpO2 97 % 02/12/22 2006  Vitals shown include unvalidated device data.  Last Pain:  Vitals:   02/12/22 1809  TempSrc: Oral  PainSc: 0-No pain         Complications:  Encounter Notable Events  Notable Event Outcome Phase Comment  Difficult to intubate - expected  Intraprocedure Filed from anesthesia note documentation.

## 2022-02-12 NOTE — Brief Op Note (Signed)
02/12/2022  8:03 PM  PATIENT:  April Lang  81 y.o. female  PRE-OPERATIVE DIAGNOSIS:  right knee open wound  POST-OPERATIVE DIAGNOSIS:  right open knee  PROCEDURE:  Procedure(s): IRRIGATION AND DEBRIDEMENT WOUND, RIGHT KNEE (Right)  SURGEON:  Surgeon(s) and Role:    Melina Schools, MD - Primary  PHYSICIAN ASSISTANT:   ASSISTANTS: none   ANESTHESIA:   general  EBL:  minimal  BLOOD ADMINISTERED:none  DRAINS:  Praveena external wound VAC    LOCAL MEDICATIONS USED:  NONE  SPECIMEN:  No Specimen  DISPOSITION OF SPECIMEN:  N/A  COUNTS:  YES  TOURNIQUET:  * Missing tourniquet times found for documented tourniquets in log: [id number] *  DICTATION: .Dragon Dictation  PLAN OF CARE: Admit for overnight observation  PATIENT DISPOSITION:  PACU - hemodynamically stable.

## 2022-02-13 ENCOUNTER — Encounter (HOSPITAL_COMMUNITY): Payer: Self-pay | Admitting: Orthopedic Surgery

## 2022-02-13 DIAGNOSIS — T8133XA Disruption of traumatic injury wound repair, initial encounter: Secondary | ICD-10-CM | POA: Diagnosis not present

## 2022-02-13 MED ORDER — CALCIUM CARBONATE ANTACID 500 MG PO CHEW: 1.0000 | CHEWABLE_TABLET | Freq: Three times a day (TID) | ORAL | Status: DC | PRN

## 2022-02-13 MED ORDER — ALUM & MAG HYDROXIDE-SIMETH 200-200-20 MG/5ML PO SUSP: 15.0000 mL | ORAL | Status: DC | PRN

## 2022-02-13 NOTE — Progress Notes (Signed)
   Subjective: 1 Day Post-Op Procedure(s) (LRB): IRRIGATION AND DEBRIDEMENT WOUND, RIGHT KNEE (Right) Patient reports pain as mild.   Patient seen in rounds for Dr. Wynelle Link. Patient has some complaints of mild indigestion. Will order some maalox and tums to take as needed. No issues overnight.  Plan is to go Home after hospital stay.  Objective: Vital signs in last 24 hours: Temp:  [97.8 F (36.6 C)-98.5 F (36.9 C)] 97.9 F (36.6 C) (06/09 8466) Pulse Rate:  [62-89] 78 (06/09 0638) Resp:  [10-21] 17 (06/09 0638) BP: (113-174)/(46-92) 174/52 (06/09 0638) SpO2:  [94 %-100 %] 100 % (06/09 5993) Weight:  [68 kg-68.9 kg] 68.9 kg (06/08 1809)  Intake/Output from previous day:  Intake/Output Summary (Last 24 hours) at 02/13/2022 0752 Last data filed at 02/13/2022 0600 Gross per 24 hour  Intake 1802.07 ml  Output 350 ml  Net 1452.07 ml    Intake/Output this shift: No intake/output data recorded.  Labs: Recent Labs    02/12/22 1525  HGB 9.7*   Recent Labs    02/12/22 1525  WBC 14.6*  RBC 2.92*  HCT 28.9*  PLT 490*   Recent Labs    02/12/22 1525  NA 135  K 3.4*  CL 99  CO2 28  BUN 14  CREATININE 0.99  GLUCOSE 122*  CALCIUM 8.8*   No results for input(s): "LABPT", "INR" in the last 72 hours.  Exam: General - Patient is Alert and Oriented Extremity - Neurologically intact Neurovascular intact Sensation intact distally Dorsiflexion/Plantar flexion intact Dressing/Incision - Wound VAC in place Motor Function - intact, moving foot and toes well on exam.   Past Medical History:  Diagnosis Date   Arthritis    Asthma    COPD (chronic obstructive pulmonary disease) ([facility])    Coronary artery disease    Cystocele 01/19/2013   Hypertension    Hypothyroidism    Pre-diabetes    Stress incontinence 01/19/2013   Thyroid disease     Assessment/Plan: 1 Day Post-Op Procedure(s) (LRB): IRRIGATION AND DEBRIDEMENT WOUND, RIGHT KNEE (Right) Principal Problem:    Wound dehiscence  Estimated body mass index is 26.09 kg/m as calculated from the following:   Height as of this encounter: '5\' 4"'$  (1.626 m).   Weight as of this encounter: 68.9 kg.  Weight-bearing as tolerated  Discharge to home with wound VAC. Will follow-up in the office with [place] in one week for removal. Has pain medication still at home from previous surgery if needed.  Theresa Duty, PA-C Orthopedic Surgery [phone] 02/13/2022, 7:52 AM

## 2022-02-13 NOTE — Plan of Care (Signed)
Patient discharged home via private vehicle with husband. Ivan Anchors, RN 02/13/22 1:08 PM

## 2022-02-13 NOTE — Progress Notes (Signed)
Transition of Care [facility]) Screening Note  Patient Details  Name: April Lang Date of Birth: Sep 01, 1941  Transition of Care [facility]) CM/SW Contact:    Sherie Don, LCSW Phone Number: 02/13/2022, 9:54 AM  Transition of Care Department [facility]) has reviewed patient and no TOC needs have been identified at this time. We will continue to monitor patient advancement through interdisciplinary progression rounds. If new patient transition needs arise, please place a TOC consult.

## 2022-02-13 NOTE — Anesthesia Postprocedure Evaluation (Signed)
Anesthesia Post Note  Patient: April Lang  Procedure(s) Performed: IRRIGATION AND DEBRIDEMENT WOUND, RIGHT KNEE (Right)     Patient location during evaluation: Other Anesthesia Type: General Level of consciousness: awake and alert Pain management: pain level controlled Vital Signs Assessment: post-procedure vital signs reviewed and stable Respiratory status: spontaneous breathing, nonlabored ventilation, respiratory function stable and patient connected to nasal cannula oxygen Cardiovascular status: blood pressure returned to baseline and stable Postop Assessment: no apparent nausea or vomiting Anesthetic complications: yes   Encounter Notable Events  Notable Event Outcome Phase Comment  Difficult to intubate - expected  Intraprocedure Filed from anesthesia note documentation.    Last Vitals:  Vitals:   02/13/22 0200 02/13/22 0638  BP: (!) 128/46 (!) 174/52  Pulse: 77 78  Resp: 16 17  Temp: 36.7 C 36.6 C  SpO2: 99% 100%    Last Pain:  Vitals:   02/13/22 0703  TempSrc:   PainSc: 2                  ,W. EDMOND

## 2022-02-13 NOTE — Plan of Care (Signed)

## 2022-02-17 NOTE — Discharge Summary (Signed)
Patient ID: April Lang MRN: [id number] DOB/AGE: 04/17/1941 81 y.o.  Admit date: 02/12/2022 Discharge date: 02/13/2022  Admission Diagnoses:  Principal Problem:   Wound dehiscence   Discharge Diagnoses:  Same  Past Medical History:  Diagnosis Date   Arthritis    Asthma    COPD (chronic obstructive pulmonary disease) ([place])    Coronary artery disease    Cystocele 01/19/2013   Hypertension    Hypothyroidism    Pre-diabetes    Stress incontinence 01/19/2013   Thyroid disease     Surgeries: Procedure(s): IRRIGATION AND DEBRIDEMENT WOUND, RIGHT KNEE on 02/12/2022   Consultants: Treatment Team:  Melina Schools, MD  Discharged Condition: Improved  Hospital Course: April Lang is an 81 y.o. female who was admitted 02/12/2022 for operative treatment ofWound dehiscence. Patient has severe unremitting pain that affects sleep, daily activities, and work/hobbies. After pre-op clearance the patient was taken to the operating room on 02/12/2022 and underwent  Procedure(s): IRRIGATION AND DEBRIDEMENT WOUND, RIGHT KNEE.    Patient was given perioperative antibiotics:  Anti-infectives (From admission, onward)    Start     Dose/Rate Route Frequency Ordered Stop   02/12/22 2300  ceFAZolin (ANCEF) IVPB 1 g/50 mL premix        1 g 100 mL/hr over 30 Minutes Intravenous Every 6 hours 02/12/22 2209 02/13/22 1214   02/12/22 1615  ceFAZolin (ANCEF) IVPB 2g/100 mL premix        2 g 200 mL/hr over 30 Minutes Intravenous  Once 02/12/22 1611 02/12/22 1701        Patient was given sequential compression devices, early ambulation, and chemoprophylaxis to prevent DVT.  Patient benefited maximally from hospital stay and there were no complications.    Recent vital signs: No data found.   Recent laboratory studies: No results for input(s): "WBC", "HGB", "HCT", "PLT", "NA", "K", "CL", "CO2", "BUN", "CREATININE", "GLUCOSE", "INR", "CALCIUM" in the last 72 hours.  Invalid input(s): "PT",  "2"   Discharge Medications:   Allergies as of 02/13/2022       Reactions   Benadryl [diphenhydramine]    Dizziness    Codeine    Almost passed out.    Lisinopril Cough   Other Rash   Arthritis med-not sure of name        Medication List     TAKE these medications    acetaminophen 500 MG tablet Commonly known as: TYLENOL Take 500-1,000 mg by mouth every 6 (six) hours as needed for pain or moderate pain.   Clobetasol Prop Emollient Base 0.05 % emollient cream APPLY A THIN LAYER TWICE DAILY FOR 2 WEEKS; THEN 2 TO 3 TIMES A WEEK. What changed:  how much to take how to take this when to take this reasons to take this additional instructions   hydrochlorothiazide 25 MG tablet Commonly known as: HYDRODIURIL TAKE ONE TABLET BY MOUTH DAILY.   HYDROcodone-acetaminophen 5-325 MG tablet Commonly known as: NORCO/VICODIN Take 1-2 tablets by mouth every 6 (six) hours as needed for severe pain.   levothyroxine 100 MCG tablet Commonly known as: SYNTHROID Take 100 mcg by mouth daily before breakfast.   losartan 50 MG tablet Commonly known as: COZAAR TAKE ONE TABLET BY MOUTH DAILY.   methocarbamol 500 MG tablet Commonly known as: ROBAXIN Take 1 tablet (500 mg total) by mouth every 6 (six) hours as needed for muscle spasms.   Multi + Omega-3 Adult Gummies Chew Chew 2 capsules by mouth daily.   ondansetron 4 MG tablet Commonly  known as: ZOFRAN Take 1 tablet (4 mg total) by mouth every 6 (six) hours as needed for nausea.   pravastatin 40 MG tablet Commonly known as: PRAVACHOL TAKE 1 TABLET BY MOUTH DAILY What changed: when to take this   SYSTANE ULTRA OP Place 1 drop into both eyes daily as needed (dry eyes).   traMADol 50 MG tablet Commonly known as: ULTRAM Take 1-2 tablets (50-100 mg total) by mouth every 6 (six) hours as needed for moderate pain.   Vitamin C 500 MG Chew Chew 500 mg by mouth daily.       ASK your doctor about these medications    aspirin  EC 325 MG tablet Take 1 tablet (325 mg total) by mouth 2 (two) times daily for 20 days. Then take one 81 mg aspirin once a day for three weeks. Then discontinue aspirin. Ask about: Should I take this medication?               Discharge Care Instructions  (From admission, onward)           Start     Ordered   02/13/22 0000  Weight bearing as tolerated        02/13/22 0756   02/13/22 0000  Change dressing       Comments: You may remove the bulky bandage (ACE wrap and gauze) two days after surgery. You will have an adhesive waterproof bandage underneath. Leave this in place until your first follow-up appointment.   02/13/22 0756            Diagnostic Studies: DG Knee Complete 4 Views Right  Result Date: 02/12/2022 CLINICAL DATA:  Fall.  Prior knee arthroplasty on 01/26/2022. EXAM: RIGHT KNEE - COMPLETE 4+ VIEW COMPARISON:  None Available. FINDINGS: Status post knee arthroplasty. Anterior subcutaneous edema and minimal subcutaneous gas. No acute fracture or dislocation. IMPRESSION: Total knee arthroplasty, without acute osseous abnormality. Anterior subcutaneous edema and minimal gas is likely the site of soft tissue injury. A portion of this appearance could be postoperative. Electronically Signed   By: Abigail Miyamoto M.D.   On: 02/12/2022 15:59    Disposition: Discharge disposition: 01-Home or Self Care       Discharge Instructions     Call MD / Call 911   Complete by: As directed    If you experience chest pain or shortness of breath, CALL 911 and be transported to the hospital emergency room.  If you develope a fever above 101 F, pus (white drainage) or increased drainage or redness at the wound, or calf pain, call your surgeon's office.   Change dressing   Complete by: As directed    You may remove the bulky bandage (ACE wrap and gauze) two days after surgery. You will have an adhesive waterproof bandage underneath. Leave this in place until your first follow-up  appointment.   Constipation Prevention   Complete by: As directed    Drink plenty of fluids.  Prune juice may be helpful.  You may use a stool softener, such as Colace (over the counter) 100 mg twice a day.  Use MiraLax (over the counter) for constipation as needed.   Diet - low sodium heart healthy   Complete by: As directed    Do not put a pillow under the knee. Place it under the heel.   Complete by: As directed    Driving restrictions   Complete by: As directed    No driving for two weeks   Post-operative  opioid taper instructions:   Complete by: As directed    POST-OPERATIVE OPIOID TAPER INSTRUCTIONS: It is important to wean off of your opioid medication as soon as possible. If you do not need pain medication after your surgery it is ok to stop day one. Opioids include: Codeine, Hydrocodone(Norco, Vicodin), Oxycodone(Percocet, oxycontin) and hydromorphone amongst others.  Long term and even short term use of opiods can cause: Increased pain response Dependence Constipation Depression Respiratory depression And more.  Withdrawal symptoms can include Flu like symptoms Nausea, vomiting And more Techniques to manage these symptoms Hydrate well Eat regular healthy meals Stay active Use relaxation techniques(deep breathing, meditating, yoga) Do Not substitute Alcohol to help with tapering If you have been on opioids for less than two weeks and do not have pain than it is ok to stop all together.  Plan to wean off of opioids This plan should start within one week post op of your joint replacement. Maintain the same interval or time between taking each dose and first decrease the dose.  Cut the total daily intake of opioids by one tablet each day Next start to increase the time between doses. The last dose that should be eliminated is the evening dose.      TED hose   Complete by: As directed    Use stockings (TED hose) for three weeks on both leg(s).  You may remove them at  night for sleeping.   Weight bearing as tolerated   Complete by: As directed         Follow-up Information     Aluisio, Pilar Plate, MD. Schedule an appointment as soon as possible for a visit in 1 week(s).   Specialty: Orthopedic Surgery Contact information: [address] [place] [address] [phone]                  Signed: Theresa Duty 02/17/2022, 1:14 PM

## 2022-02-23 ENCOUNTER — Other Ambulatory Visit: Payer: Self-pay | Admitting: Student

## 2022-02-23 ENCOUNTER — Ambulatory Visit
Admission: RE | Admit: 2022-02-23 | Discharge: 2022-02-23 | Disposition: A | Discharge status: Home / Self Care | Payer: Medicare Other | Source: Ambulatory Visit | Attending: Student | Admitting: Student

## 2022-02-23 DIAGNOSIS — M79672 Pain in left foot: Secondary | ICD-10-CM

## 2022-02-23 DIAGNOSIS — M7989 Other specified soft tissue disorders: Secondary | ICD-10-CM | POA: Diagnosis not present

## 2022-02-25 DIAGNOSIS — S92325A Nondisplaced fracture of second metatarsal bone, left foot, initial encounter for closed fracture: Secondary | ICD-10-CM | POA: Diagnosis not present

## 2022-02-25 DIAGNOSIS — S92334A Nondisplaced fracture of third metatarsal bone, right foot, initial encounter for closed fracture: Secondary | ICD-10-CM | POA: Diagnosis not present

## 2022-03-05 DIAGNOSIS — R262 Difficulty in walking, not elsewhere classified: Secondary | ICD-10-CM | POA: Diagnosis not present

## 2022-03-05 DIAGNOSIS — Z471 Aftercare following joint replacement surgery: Secondary | ICD-10-CM | POA: Diagnosis not present

## 2022-03-05 DIAGNOSIS — M25572 Pain in left ankle and joints of left foot: Secondary | ICD-10-CM | POA: Diagnosis not present

## 2022-03-05 DIAGNOSIS — Z5189 Encounter for other specified aftercare: Secondary | ICD-10-CM | POA: Diagnosis not present

## 2022-03-05 DIAGNOSIS — M25561 Pain in right knee: Secondary | ICD-10-CM | POA: Diagnosis not present

## 2022-03-05 DIAGNOSIS — Z96651 Presence of right artificial knee joint: Secondary | ICD-10-CM | POA: Diagnosis not present

## 2022-03-06 ENCOUNTER — Other Ambulatory Visit: Payer: Self-pay

## 2022-03-06 DIAGNOSIS — I6522 Occlusion and stenosis of left carotid artery: Secondary | ICD-10-CM

## 2022-03-09 DIAGNOSIS — M25561 Pain in right knee: Secondary | ICD-10-CM | POA: Diagnosis not present

## 2022-03-09 DIAGNOSIS — M25572 Pain in left ankle and joints of left foot: Secondary | ICD-10-CM | POA: Diagnosis not present

## 2022-03-09 DIAGNOSIS — R262 Difficulty in walking, not elsewhere classified: Secondary | ICD-10-CM | POA: Diagnosis not present

## 2022-03-09 DIAGNOSIS — Z5189 Encounter for other specified aftercare: Secondary | ICD-10-CM | POA: Diagnosis not present

## 2022-03-09 DIAGNOSIS — Z96651 Presence of right artificial knee joint: Secondary | ICD-10-CM | POA: Diagnosis not present

## 2022-03-12 DIAGNOSIS — R262 Difficulty in walking, not elsewhere classified: Secondary | ICD-10-CM | POA: Diagnosis not present

## 2022-03-12 DIAGNOSIS — Z96651 Presence of right artificial knee joint: Secondary | ICD-10-CM | POA: Diagnosis not present

## 2022-03-12 DIAGNOSIS — M25572 Pain in left ankle and joints of left foot: Secondary | ICD-10-CM | POA: Diagnosis not present

## 2022-03-12 DIAGNOSIS — M25561 Pain in right knee: Secondary | ICD-10-CM | POA: Diagnosis not present

## 2022-03-12 DIAGNOSIS — Z5189 Encounter for other specified aftercare: Secondary | ICD-10-CM | POA: Diagnosis not present

## 2022-03-16 DIAGNOSIS — Z96651 Presence of right artificial knee joint: Secondary | ICD-10-CM | POA: Diagnosis not present

## 2022-03-16 DIAGNOSIS — M25572 Pain in left ankle and joints of left foot: Secondary | ICD-10-CM | POA: Diagnosis not present

## 2022-03-16 DIAGNOSIS — M25561 Pain in right knee: Secondary | ICD-10-CM | POA: Diagnosis not present

## 2022-03-16 DIAGNOSIS — R262 Difficulty in walking, not elsewhere classified: Secondary | ICD-10-CM | POA: Diagnosis not present

## 2022-03-16 DIAGNOSIS — Z471 Aftercare following joint replacement surgery: Secondary | ICD-10-CM | POA: Diagnosis not present

## 2022-03-16 DIAGNOSIS — Z5189 Encounter for other specified aftercare: Secondary | ICD-10-CM | POA: Diagnosis not present

## 2022-03-19 DIAGNOSIS — Z96651 Presence of right artificial knee joint: Secondary | ICD-10-CM | POA: Diagnosis not present

## 2022-03-19 DIAGNOSIS — Z471 Aftercare following joint replacement surgery: Secondary | ICD-10-CM | POA: Diagnosis not present

## 2022-03-19 DIAGNOSIS — M25572 Pain in left ankle and joints of left foot: Secondary | ICD-10-CM | POA: Diagnosis not present

## 2022-03-19 DIAGNOSIS — R262 Difficulty in walking, not elsewhere classified: Secondary | ICD-10-CM | POA: Diagnosis not present

## 2022-03-19 DIAGNOSIS — M25561 Pain in right knee: Secondary | ICD-10-CM | POA: Diagnosis not present

## 2022-03-19 DIAGNOSIS — Z5189 Encounter for other specified aftercare: Secondary | ICD-10-CM | POA: Diagnosis not present

## 2022-03-20 ENCOUNTER — Encounter (HOSPITAL_COMMUNITY): Payer: Medicare Other

## 2022-03-23 DIAGNOSIS — M25572 Pain in left ankle and joints of left foot: Secondary | ICD-10-CM | POA: Diagnosis not present

## 2022-03-23 DIAGNOSIS — Z96651 Presence of right artificial knee joint: Secondary | ICD-10-CM | POA: Diagnosis not present

## 2022-03-23 DIAGNOSIS — R262 Difficulty in walking, not elsewhere classified: Secondary | ICD-10-CM | POA: Diagnosis not present

## 2022-03-23 DIAGNOSIS — M25561 Pain in right knee: Secondary | ICD-10-CM | POA: Diagnosis not present

## 2022-03-23 DIAGNOSIS — Z5189 Encounter for other specified aftercare: Secondary | ICD-10-CM | POA: Diagnosis not present

## 2022-03-26 DIAGNOSIS — M25572 Pain in left ankle and joints of left foot: Secondary | ICD-10-CM | POA: Diagnosis not present

## 2022-03-26 DIAGNOSIS — Z96651 Presence of right artificial knee joint: Secondary | ICD-10-CM | POA: Diagnosis not present

## 2022-03-26 DIAGNOSIS — M25561 Pain in right knee: Secondary | ICD-10-CM | POA: Diagnosis not present

## 2022-03-26 DIAGNOSIS — Z5189 Encounter for other specified aftercare: Secondary | ICD-10-CM | POA: Diagnosis not present

## 2022-03-26 DIAGNOSIS — R262 Difficulty in walking, not elsewhere classified: Secondary | ICD-10-CM | POA: Diagnosis not present

## 2022-03-27 ENCOUNTER — Ambulatory Visit (HOSPITAL_COMMUNITY)
Admission: RE | Admit: 2022-03-27 | Discharge: 2022-03-27 | Disposition: A | Discharge status: Home / Self Care | Payer: Medicare Other | Source: Ambulatory Visit | Attending: Cardiovascular Disease | Admitting: Cardiovascular Disease

## 2022-03-27 DIAGNOSIS — S92334D Nondisplaced fracture of third metatarsal bone, right foot, subsequent encounter for fracture with routine healing: Secondary | ICD-10-CM | POA: Diagnosis not present

## 2022-03-27 DIAGNOSIS — S92324D Nondisplaced fracture of second metatarsal bone, right foot, subsequent encounter for fracture with routine healing: Secondary | ICD-10-CM | POA: Diagnosis not present

## 2022-03-27 DIAGNOSIS — S92325A Nondisplaced fracture of second metatarsal bone, left foot, initial encounter for closed fracture: Secondary | ICD-10-CM | POA: Diagnosis not present

## 2022-03-27 DIAGNOSIS — I6522 Occlusion and stenosis of left carotid artery: Secondary | ICD-10-CM | POA: Diagnosis not present

## 2022-03-30 DIAGNOSIS — Z5189 Encounter for other specified aftercare: Secondary | ICD-10-CM | POA: Diagnosis not present

## 2022-03-30 DIAGNOSIS — R262 Difficulty in walking, not elsewhere classified: Secondary | ICD-10-CM | POA: Diagnosis not present

## 2022-03-30 DIAGNOSIS — M25572 Pain in left ankle and joints of left foot: Secondary | ICD-10-CM | POA: Diagnosis not present

## 2022-03-30 DIAGNOSIS — M25561 Pain in right knee: Secondary | ICD-10-CM | POA: Diagnosis not present

## 2022-03-30 DIAGNOSIS — Z96651 Presence of right artificial knee joint: Secondary | ICD-10-CM | POA: Diagnosis not present

## 2022-04-02 DIAGNOSIS — Z5189 Encounter for other specified aftercare: Secondary | ICD-10-CM | POA: Diagnosis not present

## 2022-04-02 DIAGNOSIS — R262 Difficulty in walking, not elsewhere classified: Secondary | ICD-10-CM | POA: Diagnosis not present

## 2022-04-02 DIAGNOSIS — M25561 Pain in right knee: Secondary | ICD-10-CM | POA: Diagnosis not present

## 2022-04-02 DIAGNOSIS — Z96651 Presence of right artificial knee joint: Secondary | ICD-10-CM | POA: Diagnosis not present

## 2022-04-02 DIAGNOSIS — Z471 Aftercare following joint replacement surgery: Secondary | ICD-10-CM | POA: Diagnosis not present

## 2022-04-02 DIAGNOSIS — M25572 Pain in left ankle and joints of left foot: Secondary | ICD-10-CM | POA: Diagnosis not present

## 2022-04-06 DIAGNOSIS — R262 Difficulty in walking, not elsewhere classified: Secondary | ICD-10-CM | POA: Diagnosis not present

## 2022-04-06 DIAGNOSIS — Z96651 Presence of right artificial knee joint: Secondary | ICD-10-CM | POA: Diagnosis not present

## 2022-04-06 DIAGNOSIS — M25572 Pain in left ankle and joints of left foot: Secondary | ICD-10-CM | POA: Diagnosis not present

## 2022-04-06 DIAGNOSIS — M25561 Pain in right knee: Secondary | ICD-10-CM | POA: Diagnosis not present

## 2022-04-06 DIAGNOSIS — Z5189 Encounter for other specified aftercare: Secondary | ICD-10-CM | POA: Diagnosis not present

## 2022-04-09 DIAGNOSIS — Z5189 Encounter for other specified aftercare: Secondary | ICD-10-CM | POA: Diagnosis not present

## 2022-04-09 DIAGNOSIS — M25572 Pain in left ankle and joints of left foot: Secondary | ICD-10-CM | POA: Diagnosis not present

## 2022-04-09 DIAGNOSIS — M25561 Pain in right knee: Secondary | ICD-10-CM | POA: Diagnosis not present

## 2022-04-09 DIAGNOSIS — R262 Difficulty in walking, not elsewhere classified: Secondary | ICD-10-CM | POA: Diagnosis not present

## 2022-04-09 DIAGNOSIS — Z96651 Presence of right artificial knee joint: Secondary | ICD-10-CM | POA: Diagnosis not present

## 2022-04-13 DIAGNOSIS — M25572 Pain in left ankle and joints of left foot: Secondary | ICD-10-CM | POA: Diagnosis not present

## 2022-04-13 DIAGNOSIS — R262 Difficulty in walking, not elsewhere classified: Secondary | ICD-10-CM | POA: Diagnosis not present

## 2022-04-13 DIAGNOSIS — Z5189 Encounter for other specified aftercare: Secondary | ICD-10-CM | POA: Diagnosis not present

## 2022-04-13 DIAGNOSIS — Z471 Aftercare following joint replacement surgery: Secondary | ICD-10-CM | POA: Diagnosis not present

## 2022-04-13 DIAGNOSIS — Z96651 Presence of right artificial knee joint: Secondary | ICD-10-CM | POA: Diagnosis not present

## 2022-04-13 DIAGNOSIS — M25561 Pain in right knee: Secondary | ICD-10-CM | POA: Diagnosis not present

## 2022-04-16 DIAGNOSIS — M25561 Pain in right knee: Secondary | ICD-10-CM | POA: Diagnosis not present

## 2022-04-16 DIAGNOSIS — M25572 Pain in left ankle and joints of left foot: Secondary | ICD-10-CM | POA: Diagnosis not present

## 2022-04-16 DIAGNOSIS — Z5189 Encounter for other specified aftercare: Secondary | ICD-10-CM | POA: Diagnosis not present

## 2022-04-16 DIAGNOSIS — Z96651 Presence of right artificial knee joint: Secondary | ICD-10-CM | POA: Diagnosis not present

## 2022-04-16 DIAGNOSIS — R262 Difficulty in walking, not elsewhere classified: Secondary | ICD-10-CM | POA: Diagnosis not present

## 2022-04-17 ENCOUNTER — Other Ambulatory Visit: Payer: Self-pay | Admitting: Family Medicine

## 2022-04-17 DIAGNOSIS — R921 Mammographic calcification found on diagnostic imaging of breast: Secondary | ICD-10-CM

## 2022-04-20 DIAGNOSIS — M25572 Pain in left ankle and joints of left foot: Secondary | ICD-10-CM | POA: Diagnosis not present

## 2022-04-20 DIAGNOSIS — Z96651 Presence of right artificial knee joint: Secondary | ICD-10-CM | POA: Diagnosis not present

## 2022-04-20 DIAGNOSIS — Z5189 Encounter for other specified aftercare: Secondary | ICD-10-CM | POA: Diagnosis not present

## 2022-04-20 DIAGNOSIS — R262 Difficulty in walking, not elsewhere classified: Secondary | ICD-10-CM | POA: Diagnosis not present

## 2022-04-20 DIAGNOSIS — M25561 Pain in right knee: Secondary | ICD-10-CM | POA: Diagnosis not present

## 2022-04-23 DIAGNOSIS — R262 Difficulty in walking, not elsewhere classified: Secondary | ICD-10-CM | POA: Diagnosis not present

## 2022-04-23 DIAGNOSIS — Z96651 Presence of right artificial knee joint: Secondary | ICD-10-CM | POA: Diagnosis not present

## 2022-04-23 DIAGNOSIS — M25572 Pain in left ankle and joints of left foot: Secondary | ICD-10-CM | POA: Diagnosis not present

## 2022-04-23 DIAGNOSIS — M25561 Pain in right knee: Secondary | ICD-10-CM | POA: Diagnosis not present

## 2022-04-23 DIAGNOSIS — Z5189 Encounter for other specified aftercare: Secondary | ICD-10-CM | POA: Diagnosis not present

## 2022-04-24 DIAGNOSIS — S92334D Nondisplaced fracture of third metatarsal bone, right foot, subsequent encounter for fracture with routine healing: Secondary | ICD-10-CM | POA: Diagnosis not present

## 2022-04-24 DIAGNOSIS — S92324D Nondisplaced fracture of second metatarsal bone, right foot, subsequent encounter for fracture with routine healing: Secondary | ICD-10-CM | POA: Diagnosis not present

## 2022-04-27 DIAGNOSIS — M25561 Pain in right knee: Secondary | ICD-10-CM | POA: Diagnosis not present

## 2022-04-27 DIAGNOSIS — M25572 Pain in left ankle and joints of left foot: Secondary | ICD-10-CM | POA: Diagnosis not present

## 2022-04-27 DIAGNOSIS — Z5189 Encounter for other specified aftercare: Secondary | ICD-10-CM | POA: Diagnosis not present

## 2022-04-27 DIAGNOSIS — R262 Difficulty in walking, not elsewhere classified: Secondary | ICD-10-CM | POA: Diagnosis not present

## 2022-04-27 DIAGNOSIS — Z96651 Presence of right artificial knee joint: Secondary | ICD-10-CM | POA: Diagnosis not present

## 2022-04-30 DIAGNOSIS — M25572 Pain in left ankle and joints of left foot: Secondary | ICD-10-CM | POA: Diagnosis not present

## 2022-04-30 DIAGNOSIS — Z96651 Presence of right artificial knee joint: Secondary | ICD-10-CM | POA: Diagnosis not present

## 2022-04-30 DIAGNOSIS — Z5189 Encounter for other specified aftercare: Secondary | ICD-10-CM | POA: Diagnosis not present

## 2022-04-30 DIAGNOSIS — Z471 Aftercare following joint replacement surgery: Secondary | ICD-10-CM | POA: Diagnosis not present

## 2022-04-30 DIAGNOSIS — R262 Difficulty in walking, not elsewhere classified: Secondary | ICD-10-CM | POA: Diagnosis not present

## 2022-04-30 DIAGNOSIS — M25561 Pain in right knee: Secondary | ICD-10-CM | POA: Diagnosis not present

## 2022-05-05 ENCOUNTER — Ambulatory Visit
Admission: RE | Admit: 2022-05-05 | Discharge: 2022-05-05 | Disposition: A | Discharge status: Home / Self Care | Payer: Medicare Other | Source: Ambulatory Visit | Attending: Family Medicine | Admitting: Family Medicine

## 2022-05-05 DIAGNOSIS — R921 Mammographic calcification found on diagnostic imaging of breast: Secondary | ICD-10-CM | POA: Diagnosis not present

## 2022-05-05 DIAGNOSIS — H04123 Dry eye syndrome of bilateral lacrimal glands: Secondary | ICD-10-CM | POA: Diagnosis not present

## 2022-05-05 DIAGNOSIS — Z961 Presence of intraocular lens: Secondary | ICD-10-CM | POA: Diagnosis not present

## 2022-05-05 DIAGNOSIS — H18413 Arcus senilis, bilateral: Secondary | ICD-10-CM | POA: Diagnosis not present

## 2022-05-05 DIAGNOSIS — H02831 Dermatochalasis of right upper eyelid: Secondary | ICD-10-CM | POA: Diagnosis not present

## 2022-05-29 DIAGNOSIS — K219 Gastro-esophageal reflux disease without esophagitis: Secondary | ICD-10-CM | POA: Diagnosis not present

## 2022-05-29 DIAGNOSIS — I10 Essential (primary) hypertension: Secondary | ICD-10-CM | POA: Diagnosis not present

## 2022-05-29 DIAGNOSIS — R1030 Lower abdominal pain, unspecified: Secondary | ICD-10-CM | POA: Diagnosis not present

## 2022-05-29 DIAGNOSIS — M159 Polyosteoarthritis, unspecified: Secondary | ICD-10-CM | POA: Diagnosis not present

## 2022-05-29 DIAGNOSIS — Z6824 Body mass index (BMI) 24.0-24.9, adult: Secondary | ICD-10-CM | POA: Diagnosis not present

## 2022-06-02 ENCOUNTER — Ambulatory Visit: Payer: Medicare Other | Admitting: Dermatology

## 2022-06-03 DIAGNOSIS — S92334D Nondisplaced fracture of third metatarsal bone, right foot, subsequent encounter for fracture with routine healing: Secondary | ICD-10-CM | POA: Diagnosis not present

## 2022-06-03 DIAGNOSIS — S92324D Nondisplaced fracture of second metatarsal bone, right foot, subsequent encounter for fracture with routine healing: Secondary | ICD-10-CM | POA: Diagnosis not present

## 2022-06-05 ENCOUNTER — Other Ambulatory Visit (HOSPITAL_COMMUNITY): Payer: Self-pay | Admitting: Cardiovascular Disease

## 2022-06-05 DIAGNOSIS — Z23 Encounter for immunization: Secondary | ICD-10-CM | POA: Diagnosis not present

## 2022-06-05 DIAGNOSIS — I6522 Occlusion and stenosis of left carotid artery: Secondary | ICD-10-CM

## 2022-07-15 ENCOUNTER — Ambulatory Visit: Payer: Medicare Other | Attending: Cardiovascular Disease | Admitting: Cardiovascular Disease

## 2022-07-15 ENCOUNTER — Encounter: Payer: Self-pay | Admitting: Cardiovascular Disease

## 2022-07-15 VITALS — BP 158/70 | HR 80 | Ht 64.0 in | Wt 144.6 lb

## 2022-07-15 DIAGNOSIS — I6522 Occlusion and stenosis of left carotid artery: Secondary | ICD-10-CM | POA: Insufficient documentation

## 2022-07-15 DIAGNOSIS — I10 Essential (primary) hypertension: Secondary | ICD-10-CM | POA: Insufficient documentation

## 2022-07-15 DIAGNOSIS — E7800 Pure hypercholesterolemia, unspecified: Secondary | ICD-10-CM | POA: Diagnosis not present

## 2022-07-15 NOTE — Assessment & Plan Note (Addendum)
History of essential hypertension a blood pressure measured today at 158/70.  She is on losartan and hydrochlorothiazide.  Follow-up blood pressure at the end of the visit was 148/65.

## 2022-07-15 NOTE — Assessment & Plan Note (Signed)
History of carotid artery disease by duplex ultrasound last checked 03/27/2022 revealing moderate left ICA stenosis.  This will be repeated on an annual basis.

## 2022-07-15 NOTE — Patient Instructions (Addendum)
Medication Instructions:  NO CHANGES  *If you need a refill on your cardiac medications before your next appointment, please call your pharmacy*   Testing:  Carotid Doppler in July 2024  Follow-Up: At [facility], you and your health needs are our priority.  As part of our continuing mission to provide you with exceptional heart care, we have created designated Provider Care Teams.  These Care Teams include your primary Cardiologist (physician) and Advanced Practice Providers (APPs -  Physician Assistants and Nurse Practitioners) who all work together to provide you with the care you need, when you need it.  We recommend signing up for the patient portal called "MyChart".  Sign up information is provided on this After Visit Summary.  MyChart is used to connect with patients for Virtual Visits (Telemedicine).  Patients are able to view lab/test results, encounter notes, upcoming appointments, etc.  Non-urgent messages can be sent to your provider as well.   To learn more about what you can do with MyChart, go to [email or web address].    Your next appointment:   12 month(s)  The format for your next appointment:   In Person  Provider:   Dr. [name] \  \

## 2022-07-15 NOTE — Assessment & Plan Note (Signed)
History of hyperlipidemia on statin therapy with lipid profile performed 09/15/2021 revealing total cholesterol 164, LDL 88 and HDL 53.

## 2022-07-15 NOTE — Progress Notes (Signed)
07/15/2022 [name]   [birth year]  [id number]  Primary Physician Sharilyn Sites, MD Primary Cardiologist: Lorretta Harp MD Garret Reddish, [place], [place]  HPI:  April Lang is a 81 y.o.  Caucasian female who I last saw in the office 10//22.  She had a MET test in the past  that was negative for ischemia, an echo that showed increased filling pressures. I began her on a diuretic to which she has had a variable response. She does have an ACE cough and was started on losartan instead. She denies chest pain or shortness of breath. Her primary care physician follows her lipid profile closely which was most recently performed 11/06/2019 revealing total cholesterol 216, LDL 127 and HDL 54 on pravastatin 10 mg a day.    We have been following her carotid Doppler studies which were performed in July of this year showing moderate left ICA stenosis.  She is neurologically asymptomatic.  Since I saw her a year ago she continues to do well.  She did have arthroscopic knee surgery Dr. 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  She denies chest pain or shortness of breath.  Current Meds  Medication Sig   hydrochlorothiazide (HYDRODIURIL) 25 MG tablet TAKE ONE TABLET BY MOUTH DAILY.   levothyroxine (SYNTHROID, LEVOTHROID) 100 MCG tablet Take 100 mcg by mouth daily before breakfast.   losartan (COZAAR) 50 MG tablet TAKE ONE TABLET BY MOUTH DAILY. (Patient taking differently: Take 50 mg by mouth daily.)   pravastatin (PRAVACHOL) 40 MG tablet TAKE 1 TABLET BY MOUTH DAILY (Patient taking differently: Take 40 mg by mouth at bedtime.)     Allergies  Allergen Reactions   Benadryl [Diphenhydramine]     Dizziness    Codeine     Almost passed out.    Lisinopril Cough   Other Rash    Arthritis med-not sure of name    Social History   Socioeconomic History   Marital status: Married    Spouse name: Not on file   Number of children: Not on  file   Years of education: Not on file   Highest education level: Not on file  Occupational History   Not on file  Tobacco Use   Smoking status: Never   Smokeless tobacco: Never  Vaping Use   Vaping Use: Never used  Substance and Sexual Activity   Alcohol use: No   Drug use: No   Sexual activity: Not Currently    Birth control/protection: Surgical    Comment: hyst  Other Topics Concern   Not on file  Social History Narrative   Not on file   Social Determinants of Health   Financial Resource Strain: Not on file  Food Insecurity: Not on file  Transportation Needs: Not on file  Physical Activity: Not on file  Stress: Not on file  Social Connections: Not on file  Intimate Partner Violence: Not on file     Review of Systems: General: negative for chills, fever, night sweats or weight changes.  Cardiovascular: negative for chest pain, dyspnea on exertion, edema, orthopnea, palpitations, paroxysmal nocturnal dyspnea or shortness of breath Dermatological: negative for rash Respiratory: negative for cough or wheezing Urologic: negative for hematuria Abdominal: negative for nausea, vomiting, diarrhea, bright red blood per rectum, melena, or hematemesis Neurologic: negative for visual changes, syncope, or dizziness All other systems reviewed and are otherwise negative except as noted above.    Blood  pressure (!) 158/70, pulse 80, height _0  (1.626 m), weight 144 lb 9.6 oz (65.6 kg), SpO2 99 %.  General appearance: alert and no distress Neck: no adenopathy, no carotid bruit, no JVD, supple, symmetrical, trachea midline, and thyroid not enlarged, symmetric, no tenderness/mass/nodules Lungs: clear to auscultation bilaterally Heart: regular rate and rhythm, S1, S2 normal, no murmur, click, rub or gallop Extremities: extremities normal, atraumatic, no cyanosis or edema Pulses: 2+ and symmetric Skin: Skin color, texture, turgor normal. No rashes or lesions Neurologic: Grossly  normal  EKG not performed today  ASSESSMENT AND PLAN:   Essential hypertension History of essential hypertension a blood pressure measured today at 158/70.  She is on losartan and hydrochlorothiazide.  Follow-up blood pressure at the end of the visit was 148/65.  Elevated cholesterol History of hyperlipidemia on statin therapy with lipid profile performed 09/15/2021 revealing total cholesterol 164, LDL 88 and HDL 53.  Carotid artery disease ([place]) History of carotid artery disease by duplex ultrasound last checked 03/27/2022 revealing moderate left ICA stenosis.  This will be repeated on an annual basis.     Lorretta Harp MD FACP,FACC,FAHA, [facility] 07/15/2022 4:04 PM

## 2022-07-18 ENCOUNTER — Other Ambulatory Visit: Payer: Self-pay | Admitting: Cardiovascular Disease

## 2022-08-20 DIAGNOSIS — Z96651 Presence of right artificial knee joint: Secondary | ICD-10-CM | POA: Diagnosis not present

## 2022-09-28 DIAGNOSIS — D518 Other vitamin B12 deficiency anemias: Secondary | ICD-10-CM | POA: Diagnosis not present

## 2022-09-28 DIAGNOSIS — M159 Polyosteoarthritis, unspecified: Secondary | ICD-10-CM | POA: Diagnosis not present

## 2022-09-28 DIAGNOSIS — Z6824 Body mass index (BMI) 24.0-24.9, adult: Secondary | ICD-10-CM | POA: Diagnosis not present

## 2022-09-28 DIAGNOSIS — Z0001 Encounter for general adult medical examination with abnormal findings: Secondary | ICD-10-CM | POA: Diagnosis not present

## 2022-09-28 DIAGNOSIS — Z1331 Encounter for screening for depression: Secondary | ICD-10-CM | POA: Diagnosis not present

## 2022-09-28 DIAGNOSIS — E039 Hypothyroidism, unspecified: Secondary | ICD-10-CM | POA: Diagnosis not present

## 2022-09-28 DIAGNOSIS — E559 Vitamin D deficiency, unspecified: Secondary | ICD-10-CM | POA: Diagnosis not present

## 2022-09-28 DIAGNOSIS — K219 Gastro-esophageal reflux disease without esophagitis: Secondary | ICD-10-CM | POA: Diagnosis not present

## 2022-09-28 DIAGNOSIS — I10 Essential (primary) hypertension: Secondary | ICD-10-CM | POA: Diagnosis not present

## 2022-10-13 ENCOUNTER — Other Ambulatory Visit: Payer: Self-pay | Admitting: Cardiovascular Disease

## 2022-10-29 DIAGNOSIS — D225 Melanocytic nevi of trunk: Secondary | ICD-10-CM | POA: Diagnosis not present

## 2022-10-29 DIAGNOSIS — Z1283 Encounter for screening for malignant neoplasm of skin: Secondary | ICD-10-CM | POA: Diagnosis not present

## 2023-01-16 ENCOUNTER — Other Ambulatory Visit: Payer: Self-pay | Admitting: Cardiovascular Disease

## 2023-01-22 DIAGNOSIS — M792 Neuralgia and neuritis, unspecified: Secondary | ICD-10-CM | POA: Diagnosis not present

## 2023-01-22 DIAGNOSIS — S92325D Nondisplaced fracture of second metatarsal bone, left foot, subsequent encounter for fracture with routine healing: Secondary | ICD-10-CM | POA: Diagnosis not present

## 2023-02-25 DIAGNOSIS — M19072 Primary osteoarthritis, left ankle and foot: Secondary | ICD-10-CM | POA: Diagnosis not present

## 2023-03-22 ENCOUNTER — Other Ambulatory Visit: Payer: Self-pay | Admitting: Family Medicine

## 2023-03-22 DIAGNOSIS — Z1231 Encounter for screening mammogram for malignant neoplasm of breast: Secondary | ICD-10-CM

## 2023-03-29 ENCOUNTER — Encounter (HOSPITAL_COMMUNITY): Payer: Self-pay

## 2023-04-01 ENCOUNTER — Ambulatory Visit (HOSPITAL_COMMUNITY)
Admission: RE | Admit: 2023-04-01 | Discharge: 2023-04-01 | Disposition: A | Discharge status: Home / Self Care | Payer: Medicare Other | Source: Ambulatory Visit | Attending: Cardiovascular Disease | Admitting: Cardiovascular Disease

## 2023-04-01 DIAGNOSIS — I6522 Occlusion and stenosis of left carotid artery: Secondary | ICD-10-CM | POA: Insufficient documentation

## 2023-04-05 ENCOUNTER — Other Ambulatory Visit (HOSPITAL_COMMUNITY): Payer: Self-pay

## 2023-04-05 DIAGNOSIS — I6522 Occlusion and stenosis of left carotid artery: Secondary | ICD-10-CM

## 2023-04-15 ENCOUNTER — Other Ambulatory Visit: Payer: Self-pay | Admitting: Cardiovascular Disease

## 2023-04-30 ENCOUNTER — Emergency Department (HOSPITAL_COMMUNITY)
Admission: EM | Admit: 2023-04-30 | Discharge: 2023-04-30 | Disposition: A | Discharge status: Home / Self Care | Payer: Medicare Other | Attending: Emergency Medicine | Admitting: Emergency Medicine

## 2023-04-30 ENCOUNTER — Other Ambulatory Visit: Payer: Self-pay

## 2023-04-30 ENCOUNTER — Encounter (HOSPITAL_COMMUNITY): Payer: Self-pay | Admitting: *Deleted

## 2023-04-30 DIAGNOSIS — T63441A Toxic effect of venom of bees, accidental (unintentional), initial encounter: Secondary | ICD-10-CM | POA: Diagnosis not present

## 2023-04-30 DIAGNOSIS — I10 Essential (primary) hypertension: Secondary | ICD-10-CM | POA: Diagnosis not present

## 2023-04-30 MED ORDER — EPINEPHRINE 0.3 MG/0.3ML IJ SOAJ: 0.3000 mg | INTRAMUSCULAR | 0 refills | Status: AC | PRN

## 2023-04-30 MED ORDER — SODIUM CHLORIDE 0.9 % IV SOLN: INTRAVENOUS | Status: DC

## 2023-04-30 MED ORDER — PREDNISONE 20 MG PO TABS: 40.0000 mg | ORAL_TABLET | Freq: Every day | ORAL | 0 refills | Status: DC

## 2023-04-30 MED ORDER — FAMOTIDINE IN NACL 20-0.9 MG/50ML-% IV SOLN
20.0000 mg | Freq: Once | INTRAVENOUS | Status: AC
  Administered 2023-04-30: 20 mg via INTRAVENOUS
  Filled 2023-04-30: qty 50

## 2023-04-30 MED ORDER — DEXAMETHASONE SODIUM PHOSPHATE 10 MG/ML IJ SOLN
10.0000 mg | Freq: Once | INTRAMUSCULAR | Status: AC
  Administered 2023-04-30: 10 mg via INTRAVENOUS
  Filled 2023-04-30: qty 1

## 2023-04-30 MED ORDER — EPINEPHRINE 0.3 MG/0.3ML IJ SOAJ
0.3000 mg | Freq: Once | INTRAMUSCULAR | Status: AC
  Administered 2023-04-30: 0.3 mg via INTRAMUSCULAR
  Filled 2023-04-30: qty 0.3

## 2023-04-30 NOTE — ED Triage Notes (Signed)
Pt reports being stung by a yellow jacket to her face and is allergic. She is itching all over and feels like she is starting to have trouble breathing. Pt talking in complete sentences, but very anxious.

## 2023-04-30 NOTE — Discharge Instructions (Signed)
You were seen in the emergency department for allergic reaction to a bee sting.  You were given medication and observed for period of time.  You can continue to use Benadryl 1 to 2 tablets every 6 hours as needed for itching.  We are refilling your EpiPen and also prescribing 3 more days of steroids.  You can use a cool compress to your face.  Follow-up with your regular doctor.  Return to the emergency department if any worsening or concerning symptoms

## 2023-04-30 NOTE — ED Provider Notes (Signed)
[place] EMERGENCY DEPARTMENT AT [facility] Provider Note   CSN: [id number] Arrival date & time: 04/30/23  1032     History  Chief Complaint  Patient presents with   Insect Bite    April Lang is a 82 y.o. female.  She is presenting after being stung in the face by a bee.  Complaining of itching all over and feeling short of breath.  She had taken 2 tablets of Benadryl prior to arrival.  The history is provided by the patient.  Allergic Reaction Presenting symptoms: difficulty breathing, itching and rash   Severity:  Moderate Prior allergic episodes:  Insect allergies Context: insect bite/sting   Relieved by:  Nothing Ineffective treatments:  Antihistamines      Home Medications Prior to Admission medications   Medication Sig Start Date End Date Taking? Authorizing Provider  acetaminophen (TYLENOL) 500 MG tablet Take 500-1,000 mg by mouth every 6 (six) hours as needed for pain or moderate pain. Patient not taking: Reported on 07/15/2022    [provider]  Ascorbic Acid (VITAMIN C) 500 MG CHEW Chew 500 mg by mouth daily. Patient not taking: Reported on 07/15/2022    [provider]  Clobetasol Prop Emollient Base 0.05 % emollient cream APPLY A THIN LAYER TWICE DAILY FOR 2 WEEKS; THEN 2 TO 3 TIMES A WEEK. Patient not taking: Reported on 07/15/2022 10/14/20   Cyril Mourning A, NP  hydrochlorothiazide (HYDRODIURIL) 25 MG tablet TAKE 1 TABLET BY MOUTH DAILY. 04/15/23   Runell Gess, MD  HYDROcodone-acetaminophen (NORCO/VICODIN) 5-325 MG tablet Take 1-2 tablets by mouth every 6 (six) hours as needed for severe pain. Patient not taking: Reported on 07/15/2022 01/27/22   Edmisten, Danford Bad L, PA  levothyroxine (SYNTHROID, LEVOTHROID) 100 MCG tablet Take 100 mcg by mouth daily before breakfast.    [provider]  losartan (COZAAR) 50 MG tablet TAKE ONE TABLET BY MOUTH DAILY. 04/15/23   Runell Gess, MD  methocarbamol (ROBAXIN) 500 MG  tablet Take 1 tablet (500 mg total) by mouth every 6 (six) hours as needed for muscle spasms. Patient not taking: Reported on 07/15/2022 01/27/22   Edmisten, Lyn Hollingshead, PA  Multiple Vitamins-Minerals (MULTI + OMEGA-3 ADULT GUMMIES) CHEW Chew 2 capsules by mouth daily. Patient not taking: Reported on 07/15/2022    [provider]  ondansetron (ZOFRAN) 4 MG tablet Take 1 tablet (4 mg total) by mouth every 6 (six) hours as needed for nausea. Patient not taking: Reported on 07/15/2022 01/27/22   Edmisten, Lyn Hollingshead, PA  Polyethyl Glycol-Propyl Glycol (SYSTANE ULTRA OP) Place 1 drop into both eyes daily as needed (dry eyes). Patient not taking: Reported on 07/15/2022    [provider]  pravastatin (PRAVACHOL) 40 MG tablet TAKE 1 TABLET BY MOUTH DAILY Patient taking differently: Take 40 mg by mouth at bedtime. 10/17/21   Runell Gess, MD  traMADol (ULTRAM) 50 MG tablet Take 1-2 tablets (50-100 mg total) by mouth every 6 (six) hours as needed for moderate pain. Patient not taking: Reported on 07/15/2022 01/27/22   Arther Abbott L, PA      Allergies    Benadryl [diphenhydramine], Codeine, Lisinopril, and Other    Review of Systems   Review of Systems  Constitutional:  Negative for fever.  Respiratory:  Positive for shortness of breath.   Cardiovascular:  Negative for chest pain.  Skin:  Positive for itching and rash.    Physical Exam Updated Vital Signs BP 132/68   Pulse  66   Temp 97.6 F (36.4 C) (Oral)   Resp 16   Ht 5\' 4"  (1.626 m)   Wt 65.6 kg   SpO2 97%   BMI 24.82 kg/m  Physical Exam Vitals and nursing note reviewed.  Constitutional:      General: She is not in acute distress.    Appearance: She is well-developed.  HENT:     Head: Normocephalic.     Comments: Left eye swelling Eyes:     Conjunctiva/sclera: Conjunctivae normal.  Cardiovascular:     Rate and Rhythm: Normal rate and regular rhythm.     Heart sounds: No murmur heard. Pulmonary:      Effort: Pulmonary effort is normal. No respiratory distress.     Breath sounds: Normal breath sounds.  Abdominal:     Palpations: Abdomen is soft.     Tenderness: There is no abdominal tenderness. There is no guarding or rebound.  Musculoskeletal:        General: No swelling.     Cervical back: Neck supple.  Skin:    General: Skin is warm and dry.     Capillary Refill: Capillary refill takes less than 2 seconds.     Findings: Erythema and rash present.     Comments: Patient has hives over her trunk  Neurological:     General: No focal deficit present.     Mental Status: She is alert.     ED Results / Procedures / Treatments   Labs (all labs ordered are listed, but only abnormal results are displayed) Labs Reviewed - No data to display  EKG None  Radiology No results found.  Procedures Procedures    Medications Ordered in ED Medications  EPINEPHrine (EPI-PEN) injection 0.3 mg (has no administration in time range)  dexamethasone (DECADRON) injection 10 mg (has no administration in time range)  famotidine (PEPCID) IVPB 20 mg premix (has no administration in time range)  0.9 %  sodium chloride infusion (has no administration in time range)    ED Course/ Medical Decision Making/ A&P Clinical Course as of 04/30/23 1811  Fri Apr 30, 2023  1311 Reassessed patient, hives look better and no respiratory distress. [MB]    Clinical Course User Index [MB] Terrilee Files, MD                                 Medical Decision Making Risk Prescription drug management.   This patient complains of generalized itching and difficulty breathing after bee sting; this involves an extensive number of treatment Options and is a complaint that carries with it a high risk of complications and morbidity. The differential includes anaphylaxis, local reaction  I ordered medication IM epinephrine, IV steroids and Pepcid and reviewed PMP when indicated. Previous records obtained and  reviewed in epic no recent admissions Cardiac monitoring reviewed, sinus rhythm Social determinants considered, no significant barriers Critical Interventions: Initiation of IV and IM medication for patient's anaphylactic reaction  After the interventions stated above, I reevaluated the patient and found patient to be symptomatically improved Admission and further testing considered, after almost 4-hour observation she is feeling improved and she is safe for discharge.  Will refill her epinephrine pen along with prescription for steroids.  Return instructions discussed.         Final Clinical Impression(s) / ED Diagnoses Final diagnoses:  Bee sting reaction, accidental or unintentional, initial encounter    Rx / DC  Orders ED Discharge Orders          Ordered    EPINEPHrine 0.3 mg/0.3 mL IJ SOAJ injection  As needed        04/30/23 1424    predniSONE (DELTASONE) 20 MG tablet  Daily,   Status:  Discontinued        04/30/23 1424    predniSONE (DELTASONE) 20 MG tablet  Daily        04/30/23 1426              Terrilee Files, MD 04/30/23 [phone]

## 2023-05-04 DIAGNOSIS — H04123 Dry eye syndrome of bilateral lacrimal glands: Secondary | ICD-10-CM | POA: Diagnosis not present

## 2023-05-04 DIAGNOSIS — H18413 Arcus senilis, bilateral: Secondary | ICD-10-CM | POA: Diagnosis not present

## 2023-05-04 DIAGNOSIS — Z961 Presence of intraocular lens: Secondary | ICD-10-CM | POA: Diagnosis not present

## 2023-05-04 DIAGNOSIS — H02831 Dermatochalasis of right upper eyelid: Secondary | ICD-10-CM | POA: Diagnosis not present

## 2023-05-11 ENCOUNTER — Ambulatory Visit
Admission: RE | Admit: 2023-05-11 | Discharge: 2023-05-11 | Disposition: A | Discharge status: Home / Self Care | Payer: Medicare Other | Source: Ambulatory Visit | Attending: Family Medicine | Admitting: Family Medicine

## 2023-05-11 DIAGNOSIS — Z1231 Encounter for screening mammogram for malignant neoplasm of breast: Secondary | ICD-10-CM

## 2023-05-13 ENCOUNTER — Other Ambulatory Visit: Payer: Self-pay | Admitting: Family Medicine

## 2023-05-13 DIAGNOSIS — R928 Other abnormal and inconclusive findings on diagnostic imaging of breast: Secondary | ICD-10-CM

## 2023-05-18 ENCOUNTER — Ambulatory Visit
Admission: RE | Admit: 2023-05-18 | Discharge: 2023-05-18 | Disposition: A | Discharge status: Home / Self Care | Payer: Medicare Other | Source: Ambulatory Visit | Attending: Family Medicine | Admitting: Family Medicine

## 2023-05-18 DIAGNOSIS — R928 Other abnormal and inconclusive findings on diagnostic imaging of breast: Secondary | ICD-10-CM

## 2023-05-18 DIAGNOSIS — N6321 Unspecified lump in the left breast, upper outer quadrant: Secondary | ICD-10-CM | POA: Diagnosis not present

## 2023-05-18 DIAGNOSIS — N6323 Unspecified lump in the left breast, lower outer quadrant: Secondary | ICD-10-CM | POA: Diagnosis not present

## 2023-05-18 DIAGNOSIS — N6002 Solitary cyst of left breast: Secondary | ICD-10-CM | POA: Diagnosis not present

## 2023-05-18 DIAGNOSIS — R922 Inconclusive mammogram: Secondary | ICD-10-CM | POA: Diagnosis not present

## 2023-05-18 DIAGNOSIS — N6320 Unspecified lump in the left breast, unspecified quadrant: Secondary | ICD-10-CM | POA: Diagnosis not present

## 2023-05-20 DIAGNOSIS — T63441A Toxic effect of venom of bees, accidental (unintentional), initial encounter: Secondary | ICD-10-CM | POA: Diagnosis not present

## 2023-05-20 DIAGNOSIS — E663 Overweight: Secondary | ICD-10-CM | POA: Diagnosis not present

## 2023-05-20 DIAGNOSIS — Z6825 Body mass index (BMI) 25.0-25.9, adult: Secondary | ICD-10-CM | POA: Diagnosis not present

## 2023-06-07 DIAGNOSIS — E782 Mixed hyperlipidemia: Secondary | ICD-10-CM | POA: Diagnosis not present

## 2023-06-07 DIAGNOSIS — E039 Hypothyroidism, unspecified: Secondary | ICD-10-CM | POA: Diagnosis not present

## 2023-06-07 DIAGNOSIS — I10 Essential (primary) hypertension: Secondary | ICD-10-CM | POA: Diagnosis not present

## 2023-06-07 DIAGNOSIS — U071 COVID-19: Secondary | ICD-10-CM | POA: Diagnosis not present

## 2023-06-07 DIAGNOSIS — J45909 Unspecified asthma, uncomplicated: Secondary | ICD-10-CM | POA: Diagnosis not present

## 2023-06-10 DIAGNOSIS — Z23 Encounter for immunization: Secondary | ICD-10-CM | POA: Diagnosis not present

## 2023-07-09 DIAGNOSIS — M25562 Pain in left knee: Secondary | ICD-10-CM | POA: Diagnosis not present

## 2023-07-19 ENCOUNTER — Ambulatory Visit: Payer: Medicare Other | Attending: Cardiovascular Disease | Admitting: Cardiovascular Disease

## 2023-07-19 ENCOUNTER — Encounter: Payer: Self-pay | Admitting: Cardiovascular Disease

## 2023-07-19 VITALS — BP 132/60 | HR 61 | Ht 64.0 in | Wt 148.0 lb

## 2023-07-19 DIAGNOSIS — E7800 Pure hypercholesterolemia, unspecified: Secondary | ICD-10-CM | POA: Diagnosis not present

## 2023-07-19 DIAGNOSIS — I10 Essential (primary) hypertension: Secondary | ICD-10-CM | POA: Diagnosis not present

## 2023-07-19 NOTE — Progress Notes (Signed)
07/19/2023 Derrek Gu   1941/06/13  [id number]  Primary Physician Assunta Found, MD Primary Cardiologist: Runell Gess MD [facility], [place], [place]  HPI:  April Lang is a 82 y.o. married Caucasian female mother of 1 son, who I last saw in the office 07/15/2022.  She had a MET test in the past  that was negative for ischemia, an echo that showed increased filling pressures. I began her on a diuretic to which she has had a variable response. She does have an ACE cough and was started on losartan instead. She denies chest pain or shortness of breath. Her primary care physician follows her lipid profile closely which was most recently performed 11/06/2019 revealing total cholesterol 216, LDL 127 and HDL 54 on pravastatin 10 mg a day.    We have been following her carotid Doppler studies which were performed in July of this year showing moderate left ICA stenosis.  She is neurologically asymptomatic.  Since I saw her a year ago she continues to do well.  She denies chest pain or shortness of breath.   Current Meds  Medication Sig   acetaminophen (TYLENOL) 500 MG tablet Take 500-1,000 mg by mouth every 6 (six) hours as needed for pain or moderate pain (pain score 4-6).   Ascorbic Acid (VITAMIN C) 500 MG CHEW Chew 500 mg by mouth daily.   Clobetasol Prop Emollient Base 0.05 % emollient cream APPLY A THIN LAYER TWICE DAILY FOR 2 WEEKS; THEN 2 TO 3 TIMES A WEEK.   EPINEPHrine 0.3 mg/0.3 mL IJ SOAJ injection Inject 0.3 mg into the muscle as needed for anaphylaxis.   HYDROcodone-acetaminophen (NORCO/VICODIN) 5-325 MG tablet Take 1-2 tablets by mouth every 6 (six) hours as needed for severe pain.   levothyroxine (SYNTHROID, LEVOTHROID) 100 MCG tablet Take 100 mcg by mouth daily before breakfast.   methocarbamol (ROBAXIN) 500 MG tablet Take 1 tablet (500 mg total) by mouth every 6 (six) hours as needed for muscle spasms.   Multiple Vitamins-Minerals (MULTI + OMEGA-3 ADULT GUMMIES) CHEW Chew  2 capsules by mouth daily.   ondansetron (ZOFRAN) 4 MG tablet Take 1 tablet (4 mg total) by mouth every 6 (six) hours as needed for nausea.   Polyethyl Glycol-Propyl Glycol (SYSTANE ULTRA OP) Place 1 drop into both eyes daily as needed (dry eyes).   predniSONE (DELTASONE) 20 MG tablet Take 2 tablets (40 mg total) by mouth daily.   traMADol (ULTRAM) 50 MG tablet Take 1-2 tablets (50-100 mg total) by mouth every 6 (six) hours as needed for moderate pain.     Allergies  Allergen Reactions   Benadryl [Diphenhydramine]     Dizziness    Codeine     Almost passed out.    Lisinopril Cough   Other Rash    Arthritis med-not sure of name    Social History   Socioeconomic History   Marital status: Married    Spouse name: Not on file   Number of children: Not on file   Years of education: Not on file   Highest education level: Not on file  Occupational History   Not on file  Tobacco Use   Smoking status: Never   Smokeless tobacco: Never  Vaping Use   Vaping status: Never Used  Substance and Sexual Activity   Alcohol use: No   Drug use: No   Sexual activity: Not Currently    Birth control/protection: Surgical    Comment: hyst  Other Topics Concern  Not on file  Social History Narrative   Not on file   Social Determinants of Health   Financial Resource Strain: Not on file  Food Insecurity: Not on file  Transportation Needs: Not on file  Physical Activity: Not on file  Stress: Not on file  Social Connections: Not on file  Intimate Partner Violence: Not on file     Review of Systems: General: negative for chills, fever, night sweats or weight changes.  Cardiovascular: negative for chest pain, dyspnea on exertion, edema, orthopnea, palpitations, paroxysmal nocturnal dyspnea or shortness of breath Dermatological: negative for rash Respiratory: negative for cough or wheezing Urologic: negative for hematuria Abdominal: negative for nausea, vomiting, diarrhea, bright red blood  per rectum, melena, or hematemesis Neurologic: negative for visual changes, syncope, or dizziness All other systems reviewed and are otherwise negative except as noted above.    Blood pressure 132/60, pulse 61, height 5\' 4"  (1.626 m), weight 148 lb (67.1 kg), SpO2 98%.  General appearance: alert and no distress Neck: no adenopathy, no carotid bruit, no JVD, supple, symmetrical, trachea midline, and thyroid not enlarged, symmetric, no tenderness/mass/nodules Lungs: clear to auscultation bilaterally Heart: regular rate and rhythm, S1, S2 normal, no murmur, click, rub or gallop Extremities: extremities normal, atraumatic, no cyanosis or edema Pulses: 2+ and symmetric Skin: Skin color, texture, turgor normal. No rashes or lesions Neurologic: Grossly normal  EKG not performed today      ASSESSMENT AND PLAN:   Essential hypertension History of essential hypertension with blood pressure measured today at 132/60.  She is on HydroDIURIL and losartan.  Elevated cholesterol History of hyperlipidemia not on statin therapy with lipid profile performed 09/28/2022 revealing total cholesterol of 201, LDL 117 and HDL 59.     Runell Gess MD [facility], [facility] 07/19/2023 10:50 AM

## 2023-07-19 NOTE — Assessment & Plan Note (Signed)
History of hyperlipidemia not on statin therapy with lipid profile performed 09/28/2022 revealing total cholesterol of 201, LDL 117 and HDL 59.

## 2023-07-19 NOTE — Assessment & Plan Note (Signed)
History of essential hypertension with blood pressure measured today at 132/60.  She is on HydroDIURIL and losartan.

## 2023-07-19 NOTE — Patient Instructions (Signed)
Medication Instructions:  Your physician recommends that you continue on your current medications as directed. Please refer to the Current Medication list given to you today.  *If you need a refill on your cardiac medications before your next appointment, please call your pharmacy*   Follow-Up: At [facility], you and your health needs are our priority.  As part of our continuing mission to provide you with exceptional heart care, we have created designated Provider Care Teams.  These Care Teams include your primary Cardiologist (physician) and Advanced Practice Providers (APPs -  Physician Assistants and Nurse Practitioners) who all work together to provide you with the care you need, when you need it.  We recommend signing up for the patient portal called "MyChart".  Sign up information is provided on this After Visit Summary.  MyChart is used to connect with patients for Virtual Visits (Telemedicine).  Patients are able to view lab/test results, encounter notes, upcoming appointments, etc.  Non-urgent messages can be sent to your provider as well.   To learn more about what you can do with MyChart, go to [email or web address].    Your next appointment:   12 month(s)  Provider:   Nanetta Batty, MD   Other Instructions Dr. Alfredo Martinez with [facility]

## 2023-08-12 DIAGNOSIS — M1712 Unilateral primary osteoarthritis, left knee: Secondary | ICD-10-CM | POA: Diagnosis not present

## 2023-09-10 DIAGNOSIS — R351 Nocturia: Secondary | ICD-10-CM | POA: Diagnosis not present

## 2023-09-10 DIAGNOSIS — N3944 Nocturnal enuresis: Secondary | ICD-10-CM | POA: Diagnosis not present

## 2023-09-10 DIAGNOSIS — R350 Frequency of micturition: Secondary | ICD-10-CM | POA: Diagnosis not present

## 2023-09-10 DIAGNOSIS — N3946 Mixed incontinence: Secondary | ICD-10-CM | POA: Diagnosis not present

## 2023-09-30 DIAGNOSIS — R7309 Other abnormal glucose: Secondary | ICD-10-CM | POA: Diagnosis not present

## 2023-09-30 DIAGNOSIS — Z1331 Encounter for screening for depression: Secondary | ICD-10-CM | POA: Diagnosis not present

## 2023-09-30 DIAGNOSIS — F419 Anxiety disorder, unspecified: Secondary | ICD-10-CM | POA: Diagnosis not present

## 2023-09-30 DIAGNOSIS — S91359A Open bite, unspecified foot, initial encounter: Secondary | ICD-10-CM | POA: Diagnosis not present

## 2023-09-30 DIAGNOSIS — D518 Other vitamin B12 deficiency anemias: Secondary | ICD-10-CM | POA: Diagnosis not present

## 2023-09-30 DIAGNOSIS — E7849 Other hyperlipidemia: Secondary | ICD-10-CM | POA: Diagnosis not present

## 2023-09-30 DIAGNOSIS — Z0001 Encounter for general adult medical examination with abnormal findings: Secondary | ICD-10-CM | POA: Diagnosis not present

## 2023-09-30 DIAGNOSIS — E039 Hypothyroidism, unspecified: Secondary | ICD-10-CM | POA: Diagnosis not present

## 2023-09-30 DIAGNOSIS — E559 Vitamin D deficiency, unspecified: Secondary | ICD-10-CM | POA: Diagnosis not present

## 2023-09-30 DIAGNOSIS — Z6825 Body mass index (BMI) 25.0-25.9, adult: Secondary | ICD-10-CM | POA: Diagnosis not present

## 2023-09-30 DIAGNOSIS — M1991 Primary osteoarthritis, unspecified site: Secondary | ICD-10-CM | POA: Diagnosis not present

## 2023-09-30 DIAGNOSIS — E782 Mixed hyperlipidemia: Secondary | ICD-10-CM | POA: Diagnosis not present

## 2023-09-30 DIAGNOSIS — E663 Overweight: Secondary | ICD-10-CM | POA: Diagnosis not present

## 2023-10-15 DIAGNOSIS — N3946 Mixed incontinence: Secondary | ICD-10-CM | POA: Diagnosis not present

## 2023-10-15 DIAGNOSIS — R350 Frequency of micturition: Secondary | ICD-10-CM | POA: Diagnosis not present

## 2023-11-05 DIAGNOSIS — I10 Essential (primary) hypertension: Secondary | ICD-10-CM | POA: Diagnosis not present

## 2023-11-05 DIAGNOSIS — F419 Anxiety disorder, unspecified: Secondary | ICD-10-CM | POA: Diagnosis not present

## 2023-11-05 DIAGNOSIS — E039 Hypothyroidism, unspecified: Secondary | ICD-10-CM | POA: Diagnosis not present

## 2023-11-11 DIAGNOSIS — M1712 Unilateral primary osteoarthritis, left knee: Secondary | ICD-10-CM | POA: Diagnosis not present

## 2023-11-24 DIAGNOSIS — R350 Frequency of micturition: Secondary | ICD-10-CM | POA: Diagnosis not present

## 2023-11-24 DIAGNOSIS — N3946 Mixed incontinence: Secondary | ICD-10-CM | POA: Diagnosis not present

## 2023-12-06 DIAGNOSIS — E039 Hypothyroidism, unspecified: Secondary | ICD-10-CM | POA: Diagnosis not present

## 2023-12-06 DIAGNOSIS — I10 Essential (primary) hypertension: Secondary | ICD-10-CM | POA: Diagnosis not present

## 2023-12-13 DIAGNOSIS — M159 Polyosteoarthritis, unspecified: Secondary | ICD-10-CM | POA: Diagnosis not present

## 2023-12-13 DIAGNOSIS — I10 Essential (primary) hypertension: Secondary | ICD-10-CM | POA: Diagnosis not present

## 2023-12-13 DIAGNOSIS — M1991 Primary osteoarthritis, unspecified site: Secondary | ICD-10-CM | POA: Diagnosis not present

## 2023-12-13 DIAGNOSIS — Z6826 Body mass index (BMI) 26.0-26.9, adult: Secondary | ICD-10-CM | POA: Diagnosis not present

## 2023-12-13 DIAGNOSIS — N3281 Overactive bladder: Secondary | ICD-10-CM | POA: Diagnosis not present

## 2023-12-13 DIAGNOSIS — E663 Overweight: Secondary | ICD-10-CM | POA: Diagnosis not present

## 2023-12-13 DIAGNOSIS — K219 Gastro-esophageal reflux disease without esophagitis: Secondary | ICD-10-CM | POA: Diagnosis not present

## 2024-01-12 DIAGNOSIS — M1712 Unilateral primary osteoarthritis, left knee: Secondary | ICD-10-CM | POA: Diagnosis not present

## 2024-02-03 DIAGNOSIS — H02834 Dermatochalasis of left upper eyelid: Secondary | ICD-10-CM | POA: Diagnosis not present

## 2024-02-03 DIAGNOSIS — Z961 Presence of intraocular lens: Secondary | ICD-10-CM | POA: Diagnosis not present

## 2024-02-03 DIAGNOSIS — H04123 Dry eye syndrome of bilateral lacrimal glands: Secondary | ICD-10-CM | POA: Diagnosis not present

## 2024-02-03 DIAGNOSIS — H18413 Arcus senilis, bilateral: Secondary | ICD-10-CM | POA: Diagnosis not present

## 2024-02-24 DIAGNOSIS — M1712 Unilateral primary osteoarthritis, left knee: Secondary | ICD-10-CM | POA: Diagnosis not present

## 2024-03-28 ENCOUNTER — Other Ambulatory Visit: Payer: Self-pay | Admitting: Physician Assistant

## 2024-03-28 DIAGNOSIS — Z1231 Encounter for screening mammogram for malignant neoplasm of breast: Secondary | ICD-10-CM

## 2024-03-31 ENCOUNTER — Ambulatory Visit (HOSPITAL_COMMUNITY)
Admission: RE | Admit: 2024-03-31 | Discharge: 2024-03-31 | Disposition: A | Discharge status: Home / Self Care | Source: Ambulatory Visit | Attending: Cardiovascular Disease | Admitting: Cardiovascular Disease

## 2024-03-31 ENCOUNTER — Ambulatory Visit: Payer: Self-pay | Admitting: Cardiovascular Disease

## 2024-03-31 DIAGNOSIS — I6522 Occlusion and stenosis of left carotid artery: Secondary | ICD-10-CM

## 2024-04-13 DIAGNOSIS — Z1283 Encounter for screening for malignant neoplasm of skin: Secondary | ICD-10-CM | POA: Diagnosis not present

## 2024-04-13 DIAGNOSIS — D225 Melanocytic nevi of trunk: Secondary | ICD-10-CM | POA: Diagnosis not present

## 2024-04-14 DIAGNOSIS — M1712 Unilateral primary osteoarthritis, left knee: Secondary | ICD-10-CM | POA: Diagnosis not present

## 2024-04-18 ENCOUNTER — Ambulatory Visit: Admitting: Physician Assistant

## 2024-04-18 ENCOUNTER — Encounter: Payer: Self-pay | Admitting: Physician Assistant

## 2024-04-18 VITALS — BP 160/78 | HR 66 | Temp 98.4°F | Ht 64.0 in | Wt 149.8 lb

## 2024-04-18 DIAGNOSIS — I10 Essential (primary) hypertension: Secondary | ICD-10-CM | POA: Diagnosis not present

## 2024-04-18 DIAGNOSIS — Z7689 Persons encountering health services in other specified circumstances: Secondary | ICD-10-CM

## 2024-04-18 DIAGNOSIS — E039 Hypothyroidism, unspecified: Secondary | ICD-10-CM

## 2024-04-18 DIAGNOSIS — E782 Mixed hyperlipidemia: Secondary | ICD-10-CM

## 2024-04-18 MED ORDER — LOSARTAN POTASSIUM 50 MG PO TABS
50.0000 mg | ORAL_TABLET | Freq: Every day | ORAL | 1 refills | Status: AC
Start: 2024-04-18 — End: ?

## 2024-04-18 MED ORDER — HYDROCHLOROTHIAZIDE 25 MG PO TABS: 25.0000 mg | ORAL_TABLET | Freq: Every day | ORAL | 1 refills | Status: DC

## 2024-04-18 MED ORDER — LEVOTHYROXINE SODIUM 100 MCG PO TABS
100.0000 ug | ORAL_TABLET | Freq: Every day | ORAL | 1 refills | Status: AC
Start: 2024-04-18 — End: ?

## 2024-04-18 NOTE — Assessment & Plan Note (Signed)
 Stable. Lipid panel today. Patient recently stopped statin due to leg soreness, but also desire to decrease medication load.  Discussed healthy diet and lifestyle.

## 2024-04-18 NOTE — Assessment & Plan Note (Signed)
 160/78, unable to obtain second reading.  Uncontrolled, however denies symptoms.  Continue current medications. No change in management. Discussed DASH diet and dietary sodium restrictions.  Continue dietary efforts and physical activity.

## 2024-04-18 NOTE — Assessment & Plan Note (Signed)
 Patient presents today with requests for thyroid  testing today. Denies symptoms. TSH and T4 today. Continue current treatment plan.

## 2024-04-18 NOTE — Progress Notes (Signed)
 New Patient Office Visit  Subjective    Patient ID: April Lang, female    DOB: [birth year]  Age: 83 y.o. MRN: [id number]  CC: No chief complaint on file.   HPI April Lang presents to establish care  Patient presents today with past medical history significant for hypertension, hyperlipidemia, hypothyroidism, carotid artery disease followed by cardiology, asthma, osteoarthritis followed by orthopedics, anxiety, and mixed incontinence. She requests thyroid  lab wokr today as she had issues with medication dosing earlier in the year. States she has recently stopped statin as it caused leg pain. She states I am 37, my time is going to come, I don't like taking a lot of medication. She has no other concerns aside from establishing care. Overdue to pneumonia, tetanus, and shingles vaccine.   Outpatient Encounter Medications as of 04/18/2024  Medication Sig   [DISCONTINUED] hydrochlorothiazide  (HYDRODIURIL ) 25 MG tablet TAKE 1 TABLET BY MOUTH DAILY.   acetaminophen  (TYLENOL ) 500 MG tablet Take 500-1,000 mg by mouth every 6 (six) hours as needed for pain or moderate pain (pain score 4-6).   Clobetasol  Prop Emollient Base 0.05 % emollient cream APPLY A THIN LAYER TWICE DAILY FOR 2 WEEKS; THEN 2 TO 3 TIMES A WEEK.   EPINEPHrine  0.3 mg/0.3 mL IJ SOAJ injection Inject 0.3 mg into the muscle as needed for anaphylaxis.   hydrochlorothiazide  (HYDRODIURIL ) 25 MG tablet Take 1 tablet (25 mg total) by mouth daily.   levothyroxine  (SYNTHROID ) 100 MCG tablet Take 1 tablet (100 mcg total) by mouth daily before breakfast.   losartan  (COZAAR ) 50 MG tablet Take 1 tablet (50 mg total) by mouth daily.   Polyethyl Glycol-Propyl Glycol (SYSTANE ULTRA OP) Place 1 drop into both eyes daily as needed (dry eyes).   [DISCONTINUED] Ascorbic Acid (VITAMIN C) 500 MG CHEW Chew 500 mg by mouth daily.   [DISCONTINUED] HYDROcodone -acetaminophen  (NORCO/VICODIN) 5-325 MG tablet Take 1-2 tablets by mouth every 6 (six) hours  as needed for severe pain.   [DISCONTINUED] levothyroxine  (SYNTHROID , LEVOTHROID) 100 MCG tablet Take 100 mcg by mouth daily before breakfast.   [DISCONTINUED] losartan  (COZAAR ) 50 MG tablet TAKE ONE TABLET BY MOUTH DAILY. (Patient not taking: Reported on 07/19/2023)   [DISCONTINUED] methocarbamol  (ROBAXIN ) 500 MG tablet Take 1 tablet (500 mg total) by mouth every 6 (six) hours as needed for muscle spasms.   [DISCONTINUED] Multiple Vitamins-Minerals (MULTI + OMEGA-3 ADULT GUMMIES) CHEW Chew 2 capsules by mouth daily.   [DISCONTINUED] ondansetron  (ZOFRAN ) 4 MG tablet Take 1 tablet (4 mg total) by mouth every 6 (six) hours as needed for nausea.   [DISCONTINUED] pravastatin  (PRAVACHOL ) 40 MG tablet TAKE 1 TABLET BY MOUTH DAILY (Patient not taking: Reported on 07/19/2023)   [DISCONTINUED] predniSONE  (DELTASONE ) 20 MG tablet Take 2 tablets (40 mg total) by mouth daily.   [DISCONTINUED] traMADol  (ULTRAM ) 50 MG tablet Take 1-2 tablets (50-100 mg total) by mouth every 6 (six) hours as needed for moderate pain.   No facility-administered encounter medications on file as of 04/18/2024.    Past Medical History:  Diagnosis Date   Arthritis    Asthma    COPD (chronic obstructive pulmonary disease) (HCC)    Coronary artery disease    Cystocele 01/19/2013   Hypertension    Hypothyroidism    Pre-diabetes    Stress incontinence 01/19/2013   Thyroid  disease     Past Surgical History:  Procedure Laterality Date   ABDOMINAL HYSTERECTOMY     ARTHROSCOPY KNEE W/ DRILLING Right    breast  biopsy Left 04/05/2020   BREAST BIOPSY Left 04/05/2020   CATARACT EXTRACTION     CHOLECYSTECTOMY     DILATION AND CURETTAGE OF UTERUS     HAND SURGERY Right    screws put in fingers   INCISION AND DRAINAGE OF WOUND Right 02/12/2022   Procedure: IRRIGATION AND DEBRIDEMENT WOUND, RIGHT KNEE;  Surgeon: Burnetta Aures, MD;  Location: [facility];  Service: Orthopedics;  Laterality: Right;   TOTAL KNEE ARTHROPLASTY Right  01/26/2022   Procedure: TOTAL KNEE ARTHROPLASTY;  Surgeon: Melodi Lerner, MD;  Location: [facility];  Service: Orthopedics;  Laterality: Right;   TUBAL LIGATION     WRIST SURGERY Left     Family History  Problem Relation Age of Onset   Cancer Mother        breast   Dementia Mother    Breast cancer Mother    Coronary artery disease Father    Cancer Maternal Aunt        breast   Breast cancer Maternal Aunt    Cancer Maternal Grandmother        breast   Breast cancer Maternal Grandmother    Kidney disease Paternal Grandmother     Social History   Socioeconomic History   Marital status: Married    Spouse name: Not on file   Number of children: Not on file   Years of education: Not on file   Highest education level: Some college, no degree  Occupational History   Not on file  Tobacco Use   Smoking status: Never   Smokeless tobacco: Never  Vaping Use   Vaping status: Never Used  Substance and Sexual Activity   Alcohol use: No   Drug use: No   Sexual activity: Not Currently    Birth control/protection: Surgical    Comment: hyst  Other Topics Concern   Not on file  Social History Narrative   Not on file   Social Drivers of Health   Financial Resource Strain: Low Risk  (04/16/2024)   Overall Financial Resource Strain (CARDIA)    Difficulty of Paying Living Expenses: Not hard at all  Food Insecurity: No Food Insecurity (04/16/2024)   Hunger Vital Sign    Worried About Running Out of Food in the Last Year: Never true    Ran Out of Food in the Last Year: Never true  Transportation Needs: No Transportation Needs (04/16/2024)   PRAPARE - Administrator, Civil Service (Medical): No    Lack of Transportation (Non-Medical): No  Physical Activity: Insufficiently Active (04/16/2024)   Exercise Vital Sign    Days of Exercise per Week: 3 days    Minutes of Exercise per Session: 30 min  Stress: Stress Concern Present (04/16/2024)   [employer] of Occupational  Health - Occupational Stress Questionnaire    Feeling of Stress: Rather much  Social Connections: Moderately Integrated (04/16/2024)   Social Connection and Isolation Panel    Frequency of Communication with Friends and Family: More than three times a week    Frequency of Social Gatherings with Friends and Family: Once a week    Attends Religious Services: More than 4 times per year    Active Member of [employer] or Organizations: No    Attends Engineer, structural: Not on file    Marital Status: Married  Catering manager Violence: Not on file    Review of Systems  Constitutional:  Negative for chills, fever and malaise/fatigue.  Eyes:  Negative for blurred vision  and double vision.  Respiratory:  Negative for cough and shortness of breath.   Cardiovascular:  Negative for chest pain and palpitations.  Musculoskeletal:  Positive for joint pain. Negative for myalgias.  Neurological:  Negative for dizziness and headaches.  Psychiatric/Behavioral:  Negative for depression. The patient is nervous/anxious.         Objective    BP (!) 160/78   Pulse 66   Temp 98.4 F (36.9 C)   Ht 5' 4 (1.626 m)   Wt 149 lb 12.8 oz (67.9 kg)   SpO2 99%   BMI 25.71 kg/m   Physical Exam Constitutional:      Appearance: Normal appearance.  HENT:     Head: Normocephalic.     Mouth/Throat:     Mouth: Mucous membranes are moist.     Pharynx: Oropharynx is clear.  Eyes:     Extraocular Movements: Extraocular movements intact.     Conjunctiva/sclera: Conjunctivae normal.  Cardiovascular:     Rate and Rhythm: Normal rate and regular rhythm.     Heart sounds: Normal heart sounds. No murmur heard. Pulmonary:     Effort: Pulmonary effort is normal.     Breath sounds: Normal breath sounds. No wheezing or rales.  Musculoskeletal:     Right lower leg: No edema.     Left lower leg: No edema.  Skin:    General: Skin is warm and dry.  Neurological:     General: No focal deficit present.      Mental Status: She is alert and oriented to person, place, and time.  Psychiatric:        Mood and Affect: Mood normal.        Behavior: Behavior normal.        Assessment & Plan:  Encounter to establish care  Essential hypertension Assessment & Plan: 160/78, unable to obtain second reading.  Uncontrolled, however denies symptoms.  Continue current medications. No change in management. Discussed DASH diet and dietary sodium restrictions.  Continue dietary efforts and physical activity.   Orders: -     CBC with Differential/Platelet -     Comprehensive metabolic panel with GFR -     hydroCHLOROthiazide ; Take 1 tablet (25 mg total) by mouth daily.  Dispense: 90 tablet; Refill: 1 -     Losartan  Potassium; Take 1 tablet (50 mg total) by mouth daily.  Dispense: 90 tablet; Refill: 1  Acquired hypothyroidism Assessment & Plan: Patient presents today with requests for thyroid  testing today. Denies symptoms. TSH and T4 today. Continue current treatment plan.   Orders: -     TSH + free T4 -     Levothyroxine  Sodium; Take 1 tablet (100 mcg total) by mouth daily before breakfast.  Dispense: 90 tablet; Refill: 1  Mixed hyperlipidemia Assessment & Plan: Stable. Lipid panel today. Patient recently stopped statin due to leg soreness, but also desire to decrease medication load.  Discussed healthy diet and lifestyle.   Orders: -     Lipid panel    Return in about 6 months (around 10/19/2024), or sooner as needed.   Charmaine Zadrian Mccauley, PA-C

## 2024-04-19 DIAGNOSIS — E039 Hypothyroidism, unspecified: Secondary | ICD-10-CM | POA: Diagnosis not present

## 2024-04-19 DIAGNOSIS — I10 Essential (primary) hypertension: Secondary | ICD-10-CM | POA: Diagnosis not present

## 2024-04-19 DIAGNOSIS — E782 Mixed hyperlipidemia: Secondary | ICD-10-CM | POA: Diagnosis not present

## 2024-04-20 ENCOUNTER — Ambulatory Visit: Payer: Self-pay | Admitting: Physician Assistant

## 2024-04-20 LAB — TSH+FREE T4
Free T4: 1.77 ng/dL (ref 0.82–1.77)
TSH: 2 u[IU]/mL (ref 0.450–4.500)

## 2024-04-20 LAB — LIPID PANEL
Chol/HDL Ratio: 3.9 ratio (ref 0.0–4.4)
Cholesterol, Total: 193 mg/dL (ref 100–199)
HDL: 49 mg/dL (ref >39)
LDL Chol Calc (NIH): 113 mg/dL — ABNORMAL HIGH (ref 0–99)
Triglycerides: 180 mg/dL — ABNORMAL HIGH (ref 0–149)
VLDL Cholesterol Cal: 31 mg/dL (ref 5–40)

## 2024-04-20 LAB — CBC WITH DIFFERENTIAL/PLATELET
Basophils Absolute: 0.1 x10E3/uL (ref 0.0–0.2)
Basos: 1 %
EOS (ABSOLUTE): 0.2 x10E3/uL (ref 0.0–0.4)
Eos: 2 %
Hematocrit: 43 % (ref 34.0–46.6)
Hemoglobin: 13.8 g/dL (ref 11.1–15.9)
Immature Grans (Abs): 0 x10E3/uL (ref 0.0–0.1)
Immature Granulocytes: 0 %
Lymphocytes Absolute: 2.6 x10E3/uL (ref 0.7–3.1)
Lymphs: 27 %
MCH: 32.6 pg (ref 26.6–33.0)
MCHC: 32.1 g/dL (ref 31.5–35.7)
MCV: 102 fL — ABNORMAL HIGH (ref 79–97)
Monocytes Absolute: 0.8 x10E3/uL (ref 0.1–0.9)
Monocytes: 8 %
Neutrophils Absolute: 6 x10E3/uL (ref 1.4–7.0)
Neutrophils: 62 %
Platelets: 407 x10E3/uL (ref 150–450)
RBC: 4.23 x10E6/uL (ref 3.77–5.28)
RDW: 12 % (ref 11.7–15.4)
WBC: 9.7 x10E3/uL (ref 3.4–10.8)

## 2024-05-04 DIAGNOSIS — H02834 Dermatochalasis of left upper eyelid: Secondary | ICD-10-CM | POA: Diagnosis not present

## 2024-05-04 DIAGNOSIS — H04123 Dry eye syndrome of bilateral lacrimal glands: Secondary | ICD-10-CM | POA: Diagnosis not present

## 2024-05-04 DIAGNOSIS — Z961 Presence of intraocular lens: Secondary | ICD-10-CM | POA: Diagnosis not present

## 2024-05-04 DIAGNOSIS — H18413 Arcus senilis, bilateral: Secondary | ICD-10-CM | POA: Diagnosis not present

## 2024-05-11 ENCOUNTER — Ambulatory Visit
Admission: RE | Admit: 2024-05-11 | Discharge: 2024-05-11 | Disposition: A | Discharge status: Home / Self Care | Source: Ambulatory Visit | Attending: Physician Assistant

## 2024-05-11 DIAGNOSIS — Z1231 Encounter for screening mammogram for malignant neoplasm of breast: Secondary | ICD-10-CM | POA: Diagnosis not present

## 2024-05-15 DIAGNOSIS — M1712 Unilateral primary osteoarthritis, left knee: Secondary | ICD-10-CM | POA: Diagnosis not present

## 2024-05-16 ENCOUNTER — Ambulatory Visit: Payer: Self-pay | Admitting: Physician Assistant

## 2024-05-22 DIAGNOSIS — M1712 Unilateral primary osteoarthritis, left knee: Secondary | ICD-10-CM | POA: Diagnosis not present

## 2024-05-29 DIAGNOSIS — M1712 Unilateral primary osteoarthritis, left knee: Secondary | ICD-10-CM | POA: Diagnosis not present

## 2024-06-15 DIAGNOSIS — Z23 Encounter for immunization: Secondary | ICD-10-CM | POA: Diagnosis not present

## 2024-07-20 DIAGNOSIS — M1712 Unilateral primary osteoarthritis, left knee: Secondary | ICD-10-CM | POA: Diagnosis not present

## 2024-08-22 ENCOUNTER — Encounter: Payer: Self-pay | Admitting: Physician Assistant

## 2024-08-22 ENCOUNTER — Ambulatory Visit: Admitting: Physician Assistant

## 2024-08-22 VITALS — BP 148/72 | HR 57 | Temp 97.5°F | Ht 64.0 in | Wt 148.0 lb

## 2024-08-22 DIAGNOSIS — I10 Essential (primary) hypertension: Secondary | ICD-10-CM

## 2024-08-22 DIAGNOSIS — Z01818 Encounter for other preprocedural examination: Secondary | ICD-10-CM | POA: Diagnosis not present

## 2024-08-22 NOTE — Assessment & Plan Note (Signed)
 Scheduled for left knee arthroplasty in February. Prefers general anesthesia due to past negative experiences with spinal anesthesia. Blood pressure slightly elevated but acceptable for surgery. Discussed risks of surgery including infection, anesthesia complications, and pain. - Proceed with preoperative evaluation on January 12th. - Ensure blood pressure is monitored and managed appropriately for surgery. - Proceed with left knee arthroplasty on February 2nd.

## 2024-08-22 NOTE — Assessment & Plan Note (Signed)
 Blood pressure is 148/72, slightly elevated. Discontinued hydrochlorothiazide  due to increased urination and incontinence. Currently on losartan . Decision made to avoid adding another antihypertensive medication to prevent potential falls and dizziness. - Continue losartan  50 mg oral daily.

## 2024-08-22 NOTE — Progress Notes (Signed)
 Established Patient Office Visit  Subjective   Patient ID: April Lang, female    DOB: 10-12-1940  Age: 83 y.o. MRN: [id number]  Chief Complaint  Patient presents with   Surgical Clearance    Patient is here for surgical clearance    Discussed the use of AI scribe software for clinical note transcription with the patient, who gave verbal consent to proceed.  History of Present Illness April Lang is an 83 year old female who presents for preoperative evaluation for left knee surgery.  She is scheduled for left knee surgery in February 2026. She has had four prior surgeries on this knee and reports 1 occasion of nausea and vomiting with anesthesia. She has had three gel injections with some pain relief and uses a copper knee brace.  She reports she stopped hydrochlorothiazide  two weeks ago due to bothersome nocturia and is now taking losartan  alone for blood pressure.  She denies recent headaches, chest pain, or shortness of breath. She manages her household activities. She has supportive neighbors who help with meals, and her husband's dementia creates challenges for daily and postoperative care planning. She denies new exertional symptoms.     Review of Systems  Constitutional:  Negative for activity change, appetite change, fatigue and fever.  Eyes:  Negative for visual disturbance.  Respiratory:  Negative for cough and shortness of breath.   Cardiovascular:  Negative for chest pain.  Gastrointestinal:  Negative for constipation, diarrhea and vomiting.  Musculoskeletal:  Positive for arthralgias.  Neurological:  Negative for light-headedness and headaches.  Psychiatric/Behavioral:  Negative for agitation and decreased concentration. The patient is not nervous/anxious.        Objective:     BP (!) 148/72   Pulse (!) 57   Temp (!) 97.5 F (36.4 C)   Ht 5' 4 (1.626 m)   Wt 148 lb (67.1 kg)   SpO2 100%   BMI 25.40 kg/m    Physical Exam Constitutional:       General: She is not in acute distress.    Appearance: Normal appearance. She is normal weight. She is not ill-appearing.  HENT:     Head: Normocephalic and atraumatic.     Mouth/Throat:     Mouth: Mucous membranes are moist.     Pharynx: Oropharynx is clear.  Eyes:     Extraocular Movements: Extraocular movements intact.     Conjunctiva/sclera: Conjunctivae normal.  Cardiovascular:     Rate and Rhythm: Normal rate and regular rhythm.     Heart sounds: Normal heart sounds. No murmur heard. Pulmonary:     Effort: Pulmonary effort is normal.     Breath sounds: Normal breath sounds. No wheezing, rhonchi or rales.  Skin:    General: Skin is warm and dry.  Neurological:     General: No focal deficit present.     Mental Status: She is alert and oriented to person, place, and time.  Psychiatric:        Mood and Affect: Mood normal.        Behavior: Behavior normal.     No results found for any visits on 08/22/24.  The ASCVD Risk score (Arnett DK, et al., 2019) failed to calculate for the following reasons:   The 2019 ASCVD risk score is only valid for ages 68 to 9   * - Cholesterol units were assumed    Assessment & Plan:   Return F/u as scheduled in Feb.   Pre-op evaluation Assessment &  Plan: Scheduled for left knee arthroplasty in February. Prefers general anesthesia due to past negative experiences with spinal anesthesia. Blood pressure slightly elevated but acceptable for surgery. Discussed risks of surgery including infection, anesthesia complications, and pain. - Proceed with preoperative evaluation on January 12th. - Ensure blood pressure is monitored and managed appropriately for surgery. - Proceed with left knee arthroplasty on February 2nd.   Essential hypertension Assessment & Plan: Blood pressure is 148/72, slightly elevated. Discontinued hydrochlorothiazide  due to increased urination and incontinence. Currently on losartan . Decision made to avoid adding another  antihypertensive medication to prevent potential falls and dizziness. - Continue losartan  50 mg oral daily.    April Linde Wilensky, PA-C

## 2024-09-18 NOTE — H&P (Signed)
 TOTAL KNEE ADMISSION H&P  Patient is being admitted for left total knee arthroplasty.  Subjective:  Chief Complaint: Left knee pain.  HPI: April Lang, 84 y.o. female has a history of pain and functional disability in the left knee due to arthritis and has failed non-surgical conservative treatments for greater than 12 weeks to include NSAID's and/or analgesics, corticosteriod injections, viscosupplementation injections, flexibility and strengthening excercises, use of assistive devices, and activity modification. Onset of symptoms was gradual, starting several years ago with gradually worsening course since that time. The patient noted prior procedures on the knee to include  arthroscopy on the left knee.  Patient currently rates pain in the left knee at 8 out of 10 with activity. Patient has night pain, worsening of pain with activity and weight bearing, pain that interferes with activities of daily living, crepitus, and joint swelling. Patient has evidence of Radiographs of the left knee demonstrate bone-on-bone changes in the patellofemoral compartment and significant medial joint space narrowing, close to bone-on-bone. by imaging studies. There is no active infection.  Patient Active Problem List   Diagnosis Date Noted   Pre-op evaluation 08/22/2024   OA (osteoarthritis) of knee 01/26/2022   Lichen sclerosus et atrophicus 01/30/2020   Vitiligo 01/16/2020   Esophageal dysphagia 12/26/2019   Carotid artery disease 02/16/2017   Anxiety 03/03/2016   HLD (hyperlipidemia) 02/27/2015   Dilated cbd, acquired 11/15/2013   GERD (gastroesophageal reflux disease) 11/15/2013   Mixed incontinence urge and stress (female)(female) 02/28/2013   Cystocele 01/19/2013   Hypothyroid 01/19/2013   Essential hypertension 01/18/2013    Past Medical History:  Diagnosis Date   Arthritis    Asthma    COPD (chronic obstructive pulmonary disease) (HCC)    Coronary artery disease    Cystocele 01/19/2013    Hypertension    Hypothyroidism    Pre-diabetes    Stress incontinence 01/19/2013   Thyroid  disease     Past Surgical History:  Procedure Laterality Date   ABDOMINAL HYSTERECTOMY     ARTHROSCOPY KNEE W/ DRILLING Right    breast biopsy Left 04/05/2020   BREAST BIOPSY Left 04/05/2020   CATARACT EXTRACTION     CHOLECYSTECTOMY     DILATION AND CURETTAGE OF UTERUS     HAND SURGERY Right    screws put in fingers   INCISION AND DRAINAGE OF WOUND Right 02/12/2022   Procedure: IRRIGATION AND DEBRIDEMENT WOUND, RIGHT KNEE;  Surgeon: Burnetta Aures, MD;  Location: [facility];  Service: Orthopedics;  Laterality: Right;   TOTAL KNEE ARTHROPLASTY Right 01/26/2022   Procedure: TOTAL KNEE ARTHROPLASTY;  Surgeon: Melodi Lerner, MD;  Location: [facility];  Service: Orthopedics;  Laterality: Right;   TUBAL LIGATION     WRIST SURGERY Left     Prior to Admission medications  Medication Sig Start Date End Date Taking? Authorizing Provider  acetaminophen  (TYLENOL ) 500 MG tablet Take 500-1,000 mg by mouth every 6 (six) hours as needed for pain or moderate pain (pain score 4-6).    [provider]  Clobetasol  Prop Emollient Base 0.05 % emollient cream APPLY A THIN LAYER TWICE DAILY FOR 2 WEEKS; THEN 2 TO 3 TIMES A WEEK. 10/14/20   Signa Nest A, NP  EPINEPHrine  0.3 mg/0.3 mL IJ SOAJ injection Inject 0.3 mg into the muscle as needed for anaphylaxis. 04/30/23   Towana Ozell BROCKS, MD  levothyroxine  (SYNTHROID ) 100 MCG tablet Take 1 tablet (100 mcg total) by mouth daily before breakfast. 04/18/24   Grooms, Hermosa, PA-C  losartan  (COZAAR )  50 MG tablet Take 1 tablet (50 mg total) by mouth daily. 04/18/24   Grooms, Courtney, PA-C  Polyethyl Glycol-Propyl Glycol (SYSTANE ULTRA OP) Place 1 drop into both eyes daily as needed (dry eyes).    [provider]  triamcinolone  cream (KENALOG ) 0.1 % Apply 1 Application topically 2 (two) times daily.    [provider]    Allergies[1]  Social History    Socioeconomic History   Marital status: Married    Spouse name: Not on file   Number of children: Not on file   Years of education: Not on file   Highest education level: Some college, no degree  Occupational History   Not on file  Tobacco Use   Smoking status: Never   Smokeless tobacco: Never  Vaping Use   Vaping status: Never Used  Substance and Sexual Activity   Alcohol use: No   Drug use: No   Sexual activity: Not Currently    Birth control/protection: Surgical    Comment: hyst  Other Topics Concern   Not on file  Social History Narrative   Not on file   Social Drivers of Health   Tobacco Use: Low Risk (08/22/2024)   Patient History    Smoking Tobacco Use: Never    Smokeless Tobacco Use: Never    Passive Exposure: Not on file  Financial Resource Strain: Low Risk (04/16/2024)   Overall Financial Resource Strain (CARDIA)    Difficulty of Paying Living Expenses: Not hard at all  Food Insecurity: No Food Insecurity (04/16/2024)   Epic    Worried About Radiation Protection Practitioner of Food in the Last Year: Never true    Ran Out of Food in the Last Year: Never true  Transportation Needs: No Transportation Needs (04/16/2024)   Epic    Lack of Transportation (Medical): No    Lack of Transportation (Non-Medical): No  Physical Activity: Insufficiently Active (04/16/2024)   Exercise Vital Sign    Days of Exercise per Week: 3 days    Minutes of Exercise per Session: 30 min  Stress: Stress Concern Present (04/16/2024)   [employer] of Occupational Health - Occupational Stress Questionnaire    Feeling of Stress: Rather much  Social Connections: Moderately Integrated (04/16/2024)   Social Connection and Isolation Panel    Frequency of Communication with Friends and Family: More than three times a week    Frequency of Social Gatherings with Friends and Family: Once a week    Attends Religious Services: More than 4 times per year    Active Member of [employer] or Organizations: No    Attends  Engineer, Structural: Not on file    Marital Status: Married  Catering Manager Violence: Not on file  Depression (PHQ2-9): Low Risk (08/22/2024)   Depression (PHQ2-9)    PHQ-2 Score: 3  Alcohol Screen: Not on file  Housing: Unknown (04/16/2024)   Epic    Unable to Pay for Housing in the Last Year: No    Number of Times Moved in the Last Year: Not on file    Homeless in the Last Year: No  Utilities: Not on file  Health Literacy: Not on file    Tobacco Use: Low Risk (08/22/2024)   Patient History    Smoking Tobacco Use: Never    Smokeless Tobacco Use: Never    Passive Exposure: Not on file   Social History   Substance and Sexual Activity  Alcohol Use No    Family History  Problem Relation Age  of Onset   Cancer Mother        breast   Dementia Mother    Breast cancer Mother    Coronary artery disease Father    Breast cancer Sister    Cancer Maternal Aunt        breast   Breast cancer Maternal Aunt    Cancer Maternal Grandmother        breast   Breast cancer Maternal Grandmother    Kidney disease Paternal Grandmother     Review of Systems  Constitutional:  Negative for chills and fever.  Respiratory:  Negative for cough.   Cardiovascular:  Negative for chest pain.  Gastrointestinal:  Negative for abdominal pain.  Genitourinary:  Negative for dysuria.  Musculoskeletal:  Positive for joint pain.    Objective:  Physical Exam: - Constitutional: Well-developed female, alert, oriented, no apparent distress. - Musculoskeletal: Moderate effusion in the left knee. Range of motion approximately 0-125 degrees. Very slight tenderness medially. No lateral tenderness or instability noted.  Imaging Review Radiographs of the left knee demonstrate bone-on-bone changes in the patellofemoral compartment and significant medial joint space narrowing, close to bone-on-bone.  Assessment/Plan:  End stage arthritis, left knee   The patient history, physical examination,  clinical judgment of the provider and imaging studies are consistent with end stage degenerative joint disease of the left knee and total knee arthroplasty is deemed medically necessary. The treatment options including medical management, injection therapy arthroscopy and arthroplasty were discussed at length. The risks and benefits of total knee arthroplasty were presented and reviewed. The risks due to aseptic loosening, infection, stiffness, patella tracking problems, thromboembolic complications and other imponderables were discussed. The patient acknowledged the explanation, agreed to proceed with the plan and consent was signed. Patient is being admitted for inpatient treatment for surgery, pain control, PT, OT, prophylactic antibiotics, VTE prophylaxis, progressive ambulation and ADLs and discharge planning. The patient is planning to be discharged home.   Patient's anticipated LOS is less than 2 midnights, meeting these requirements: - Lives within 1 hour of care - Has a competent adult at home to recover with post-op recover - NO history of  - Chronic pain requiring opioids  - Diabetes  - Heart failure  - Heart attack  - Stroke  - DVT/VTE  - Cardiac arrhythmia  - Renal failure  - Anemia  - Advanced Liver disease  Therapy Plans: [facility]  Disposition: Home with Husband with dementia and son  Planned DVT Prophylaxis: 81mg  Aspirin  BID DME Needed: none PCP: Charmaine Jansky Grooms, PA-C (clearance received)  TXA: IV Allergies: codeine (dizziness), Diphenhydramine (Dizziness, done okay with it recently), lisinopril (cough), Penicillins (unsure rxn), Gemtesa (N/V)  Metal Allergies: none  Anesthesia Concerns: Concerned with Spinal  BMI: 25.5  Last HgbA1c: Not diabetic  Pharmacy: [facility] to bring on DC Pain regimen: Oxycodone , Tramadol , methocarbamol  (had oxycodone  with right knee surgery and did well)   - Patient was instructed on what medications to stop prior to  surgery. - Follow-up visit in 2 weeks with Dr. Melodi - Begin physical therapy following surgery - Pre-operative lab work as pre-surgical testing - Prescriptions will be provided in hospital at time of discharge  Waddell Sor, PA-C Orthopedic Surgery [facility] [place]      [1]  Allergies Allergen Reactions   Benadryl [Diphenhydramine]     Dizziness    Codeine     Almost passed out.    Lisinopril Cough   Penicillins Other (See Comments)  Vibegron Nausea And Vomiting and Other (See Comments)   Other Rash    Arthritis med-not sure of name

## 2024-09-22 ENCOUNTER — Ambulatory Visit

## 2024-10-02 ENCOUNTER — Encounter (HOSPITAL_COMMUNITY): Admission: RE | Admit: 2024-10-02 | Source: Ambulatory Visit

## 2024-10-03 NOTE — Patient Instructions (Signed)
 SURGICAL WAITING ROOM VISITATION  Patients having surgery or a procedure may have no more than 2 support people in the waiting area - these visitors may rotate.    Children ages 37 and under will not be able to visit patients in [facility] under most circumstances.   Visitors with respiratory illnesses are discouraged from visiting and should remain at home.  If the patient needs to stay at the hospital during part of their recovery, the visitor guidelines for inpatient rooms apply. Pre-op nurse will coordinate an appropriate time for 1 support person to accompany patient in pre-op.  This support person may not rotate.    Please refer to the [facility] website for the visitor guidelines for Inpatients (after your surgery is over and you are in a regular room).       Your procedure is scheduled on:  10/09/2024    Report to [facility] Main Entrance    Report to admitting at  1000 AM   Call this number if you have problems the morning of surgery [phone]   Do not eat food :After Midnight.   After Midnight you may have the following liquids until _ 0930_____ AM DAY OF SURGERY  Water Non-Citrus Juices (without pulp, NO RED-Apple, White grape, White cranberry) Black Coffee (NO MILK/CREAM OR CREAMERS, sugar ok)  Clear Tea (NO MILK/CREAM OR CREAMERS, sugar ok) regular and decaf                             Plain Jell-O (NO RED)                                           Fruit ices (not with fruit pulp, NO RED)                                     Popsicles (NO RED)                                                               Sports drinks like Gatorade (NO RED)                    The day of surgery:  Drink ONE (1) Pre-Surgery Clear Ensure or G2 at   0930AM the morning of surgery. Drink in one sitting. Do not sip.  This drink was given to you during your hospital  pre-op appointment visit. Nothing else to drink after completing the  Pre-Surgery Clear Ensure or  G2.          If you have questions, please contact your surgeons office.      Oral Hygiene is also important to reduce your risk of infection.                                    Remember - BRUSH YOUR TEETH THE MORNING OF SURGERY WITH YOUR REGULAR TOOTHPASTE  DENTURES WILL BE REMOVED PRIOR TO SURGERY PLEASE DO NOT APPLY Poly  grip OR ADHESIVES!!!   Do NOT smoke after Midnight   Stop all vitamins and herbal supplements 7 days before surgery.   Take these medicines the morning of surgery with A SIP OF WATER:  synthroid    DO NOT TAKE ANY ORAL DIABETIC MEDICATIONS DAY OF YOUR SURGERY  Bring CPAP mask and tubing day of surgery.                              You may not have any metal on your body including hair pins, jewelry, and body piercing             Do not wear make-up, lotions, powders, perfumes/cologne, or deodorant  Do not wear nail polish including gel and S&S, artificial/acrylic nails, or any other type of covering on natural nails including finger and toenails. If you have artificial nails, gel coating, etc. that needs to be removed by a nail salon please have this removed prior to surgery or surgery may need to be canceled/ delayed if the surgeon/ anesthesia feels like they are unable to be safely monitored.   Do not shave  48 hours prior to surgery.               Men may shave face and neck.   Do not bring valuables to the hospital. [facility] IS NOT             RESPONSIBLE   FOR VALUABLES.   Contacts, glasses, dentures or bridgework may not be worn into surgery.   Bring small overnight bag day of surgery.   DO NOT BRING YOUR HOME MEDICATIONS TO THE HOSPITAL. PHARMACY WILL DISPENSE MEDICATIONS LISTED ON YOUR MEDICATION LIST TO YOU DURING YOUR ADMISSION IN THE HOSPITAL!    Patients discharged on the day of surgery will not be allowed to drive home.  Someone NEEDS to stay with you for the first 24 hours after anesthesia.   Special Instructions: Bring a copy of your  healthcare power of attorney and living will documents the day of surgery if you haven't scanned them before.              Please read over the following fact sheets you were given: IF YOU HAVE QUESTIONS ABOUT YOUR PRE-OP INSTRUCTIONS PLEASE CALL [phone].   If you received a COVID test during your pre-op visit  it is requested that you wear a mask when out in public, stay away from anyone that may not be feeling well and notify your surgeon if you develop symptoms. If you test positive for Covid or have been in contact with anyone that has tested positive in the last 10 days please notify you surgeon.      Pre-operative 4 CHG Bath Instructions   You can play a key role in reducing the risk of infection after surgery. Your skin needs to be as free of germs as possible. You can reduce the number of germs on your skin by washing with CHG (chlorhexidine  gluconate) soap before surgery. CHG is an antiseptic soap that kills germs and continues to kill germs even after washing.   DO NOT use if you have an allergy to chlorhexidine /CHG or antibacterial soaps. If your skin becomes reddened or irritated, stop using the CHG and notify one of our RNs at [phone].   Please shower with the CHG soap starting 4 days before surgery using the following schedule:     Please keep in mind  the following:  DO NOT shave, including legs and underarms, starting the day of your first shower.   You may shave your face at any point before/day of surgery.  Place clean sheets on your bed the day you start using CHG soap. Use a clean washcloth (not used since being washed) for each shower. DO NOT sleep with pets once you start using the CHG.   CHG Shower Instructions:  If you choose to wash your hair and private area, wash first with your normal shampoo/soap.  After you use shampoo/soap, rinse your hair and body thoroughly to remove shampoo/soap residue.  Turn the water OFF and apply about 3 tablespoons (45 ml) of  CHG soap to a CLEAN washcloth.  Apply CHG soap ONLY FROM YOUR NECK DOWN TO YOUR TOES (washing for 3-5 minutes)  DO NOT use CHG soap on face, private areas, open wounds, or sores.  Pay special attention to the area where your surgery is being performed.  If you are having back surgery, having someone wash your back for you may be helpful. Wait 2 minutes after CHG soap is applied, then you may rinse off the CHG soap.  Pat dry with a clean towel  Put on clean clothes/pajamas   If you choose to wear lotion, please use ONLY the CHG-compatible lotions on the back of this paper.     Additional instructions for the day of surgery: DO NOT APPLY any lotions, deodorants, cologne, or perfumes.   Put on clean/comfortable clothes.  Brush your teeth.  Ask your nurse before applying any prescription medications to the skin.      CHG Compatible Lotions   Aveeno Moisturizing lotion  Cetaphil Moisturizing Cream  Cetaphil Moisturizing Lotion  Clairol Herbal Essence Moisturizing Lotion, Dry Skin  Clairol Herbal Essence Moisturizing Lotion, Extra Dry Skin  Clairol Herbal Essence Moisturizing Lotion, Normal Skin  Curel Age Defying Therapeutic Moisturizing Lotion with Alpha Hydroxy  Curel Extreme Care Body Lotion  Curel Soothing Hands Moisturizing Hand Lotion  Curel Therapeutic Moisturizing Cream, Fragrance-Free  Curel Therapeutic Moisturizing Lotion, Fragrance-Free  Curel Therapeutic Moisturizing Lotion, Original Formula  Eucerin Daily Replenishing Lotion  Eucerin Dry Skin Therapy Plus Alpha Hydroxy Crme  Eucerin Dry Skin Therapy Plus Alpha Hydroxy Lotion  Eucerin Original Crme  Eucerin Original Lotion  Eucerin Plus Crme Eucerin Plus Lotion  Eucerin TriLipid Replenishing Lotion  Keri Anti-Bacterial Hand Lotion  Keri Deep Conditioning Original Lotion Dry Skin Formula Softly Scented  Keri Deep Conditioning Original Lotion, Fragrance Free Sensitive Skin Formula  Keri Lotion Fast Absorbing  Fragrance Free Sensitive Skin Formula  Keri Lotion Fast Absorbing Softly Scented Dry Skin Formula  Keri Original Lotion  Keri Skin Renewal Lotion Keri Silky Smooth Lotion  Keri Silky Smooth Sensitive Skin Lotion  Nivea Body Creamy Conditioning Oil  Nivea Body Extra Enriched Teacher, Adult Education Moisturizing Lotion Nivea Crme  Nivea Skin Firming Lotion  NutraDerm 30 Skin Lotion  NutraDerm Skin Lotion  NutraDerm Therapeutic Skin Cream  NutraDerm Therapeutic Skin Lotion  ProShield Protective Hand Cream  Provon moisturizing lotion

## 2024-10-03 NOTE — Progress Notes (Signed)
 Anesthesia Review:  PCP: [facility] preop eval on 08/22/24  Cardiologist :  PPM/ ICD: Device Orders: Rep Notified:  Chest x-ray : EKG : Echo : 2013  Stress test: Cardiac Cath :   Activity level:  Sleep Study/ CPAP : Fasting Blood Sugar :      / Checks Blood Sugar -- times a day:     Prediabetes -   Blood Thinner/ Instructions /Last Dose: ASA / Instructions/ Last Dose :

## 2024-10-04 ENCOUNTER — Encounter (HOSPITAL_COMMUNITY): Payer: Self-pay

## 2024-10-04 ENCOUNTER — Other Ambulatory Visit: Payer: Self-pay

## 2024-10-04 ENCOUNTER — Encounter (HOSPITAL_COMMUNITY)
Admission: RE | Admit: 2024-10-04 | Discharge: 2024-10-04 | Disposition: A | Discharge status: Home / Self Care | Source: Ambulatory Visit | Attending: Orthopedic Surgery | Admitting: Orthopedic Surgery

## 2024-10-04 VITALS — BP 167/57 | HR 57 | Temp 99.2°F | Resp 16 | Ht 64.0 in | Wt 140.0 lb

## 2024-10-04 DIAGNOSIS — I10 Essential (primary) hypertension: Secondary | ICD-10-CM | POA: Diagnosis not present

## 2024-10-04 DIAGNOSIS — I6523 Occlusion and stenosis of bilateral carotid arteries: Secondary | ICD-10-CM | POA: Insufficient documentation

## 2024-10-04 DIAGNOSIS — Z96651 Presence of right artificial knee joint: Secondary | ICD-10-CM | POA: Insufficient documentation

## 2024-10-04 DIAGNOSIS — J45909 Unspecified asthma, uncomplicated: Secondary | ICD-10-CM | POA: Diagnosis not present

## 2024-10-04 DIAGNOSIS — Z01812 Encounter for preprocedural laboratory examination: Secondary | ICD-10-CM | POA: Diagnosis present

## 2024-10-04 DIAGNOSIS — Z01818 Encounter for other preprocedural examination: Secondary | ICD-10-CM | POA: Diagnosis not present

## 2024-10-04 DIAGNOSIS — E785 Hyperlipidemia, unspecified: Secondary | ICD-10-CM | POA: Diagnosis not present

## 2024-10-04 DIAGNOSIS — Z01810 Encounter for preprocedural cardiovascular examination: Secondary | ICD-10-CM | POA: Diagnosis present

## 2024-10-04 DIAGNOSIS — M1712 Unilateral primary osteoarthritis, left knee: Secondary | ICD-10-CM | POA: Diagnosis not present

## 2024-10-04 DIAGNOSIS — K219 Gastro-esophageal reflux disease without esophagitis: Secondary | ICD-10-CM | POA: Insufficient documentation

## 2024-10-04 DIAGNOSIS — E039 Hypothyroidism, unspecified: Secondary | ICD-10-CM | POA: Insufficient documentation

## 2024-10-04 HISTORY — DX: Other complications of anesthesia, initial encounter: T88.59XA

## 2024-10-04 HISTORY — DX: Failed or difficult intubation, initial encounter: T88.4XXA

## 2024-10-04 HISTORY — DX: Nausea with vomiting, unspecified: R11.2

## 2024-10-04 HISTORY — DX: Gastro-esophageal reflux disease without esophagitis: K21.9

## 2024-10-04 LAB — CBC
HCT: 41.3 % (ref 36.0–46.0)
Hemoglobin: 13.2 g/dL (ref 12.0–15.0)
MCH: 32.3 pg (ref 26.0–34.0)
MCHC: 32 g/dL (ref 30.0–36.0)
MCV: 101 fL — ABNORMAL HIGH (ref 80.0–100.0)
Platelets: 311 10*3/uL (ref 150–400)
RBC: 4.09 MIL/uL (ref 3.87–5.11)
RDW: 12.3 % (ref 11.5–15.5)
WBC: 8.4 10*3/uL (ref 4.0–10.5)
nRBC: 0 % (ref 0.0–0.2)

## 2024-10-04 LAB — BASIC METABOLIC PANEL WITH GFR
Anion gap: 10 (ref 5–15)
BUN: 17 mg/dL (ref 8–23)
CO2: 27 mmol/L (ref 22–32)
Calcium: 9.8 mg/dL (ref 8.9–10.3)
Chloride: 102 mmol/L (ref 98–111)
Creatinine, Ser: 0.89 mg/dL (ref 0.44–1.00)
GFR, Estimated: >60 mL/min
Glucose, Bld: 95 mg/dL (ref 70–99)
Potassium: 4.8 mmol/L (ref 3.5–5.1)
Sodium: 138 mmol/L (ref 135–145)

## 2024-10-04 LAB — SURGICAL PCR SCREEN
MRSA, PCR: NEGATIVE
Staphylococcus aureus: NEGATIVE

## 2024-10-05 ENCOUNTER — Encounter (HOSPITAL_COMMUNITY): Payer: Self-pay

## 2024-10-05 NOTE — Progress Notes (Signed)
 " Case: [id number] Date/Time: 10/09/24 1225   Procedure: ARTHROPLASTY, KNEE, TOTAL (Left: Knee)   Anesthesia type: Choice   Pre-op diagnosis: Left knee osteoarthritis   [facility]   Surgeons: April Lerner, MD       DISCUSSION: April Lang is an 84 yo female with PMH of HTN, carotid artery stenosis, asthma, GERD, hypothyroid, arthritis.  Prior anesthesia complications include PONV. Patient also reports she had difficulty with spinal anesthesia with her last TKA in 2023 and was stuck 5 times. Patient also with hx of difficult intubation noted in 02/2022 knee surgery:   Difficulty Due To: Difficulty was anticipated, Difficult Airway- due to anterior larynx, Difficult Airway- due to limited oral opening and Difficult Airway- due to dentition Future Recommendations: Recommend- induction with short-acting agent, and alternative techniques readily available Comments: DIFFICULT INTUBATION-- CONSIDER GLIDESCOPE INTUBATION.April Lang IV induction-- intubation AM CRNA atraumatic- anterior-- bilat BS April Lang--- teeth and mouth as preop   Patient seen by Cardiology in the past for for HTN, HLD, and DOE. No recent cardiac testing but had a MET test that was negative for ischemia, an echo that showed increased filling pressures in 2013. Last seen on 07/19/2023. All issues controlled.   Seen by PCP on 08/22/2024 for pre op clearance.  Cleared for surgery:  Pre-op evaluation Assessment & Plan: Scheduled for left knee arthroplasty in February. Prefers general anesthesia due to past negative experiences with spinal anesthesia. Blood pressure slightly elevated but acceptable for surgery. Discussed risks of surgery including infection, anesthesia complications, and pain. - Proceed with preoperative evaluation on January 12th. - Ensure blood pressure is monitored and managed appropriately for surgery. - Proceed with left knee arthroplasty on February 2nd.  VS: BP (!) 167/57    Pulse (!) 57   Temp 37.3 C (Oral)   Resp 16   Ht 5' 4 (1.626 m)   Wt 63.5 kg   SpO2 99%   BMI 24.03 kg/m   PROVIDERS: April Lang, April Point, PA-C   LABS: Labs reviewed: Acceptable for surgery. (all labs ordered are listed, but only abnormal results are displayed)  Labs Reviewed  CBC - Abnormal; Notable for the following components:      Result Value   MCV 101.0 (*)    All other components within normal limits  SURGICAL PCR SCREEN  BASIC METABOLIC PANEL WITH GFR     EKG 10/04/24:  Normal sinus rhythm Left axis deviation Non-- specific intraventricular conduction block Minimal voltage criteria for LVH, may be normal variant Possible lateral infarct, age undetermined Looks similar to prior EKGs other than artifact   Carotid Duplex 03/31/24:  Summary: Right Carotid: There is no evidence of stenosis in the right ICA. The                extracranial vessels were near-normal with only minimal wall                thickening or plaque.  Left Carotid: Velocities in the left ICA are consistent with a 40-59% stenosis.               Non-hemodynamically significant plaque <50% noted in the CCA. LICA               stenosis based on peak systolic velocities and plaque formation.  Vertebrals:  Bilateral vertebral arteries demonstrate antegrade flow. Subclavians: Right subclavian artery flow was disturbed. Normal flow              hemodynamics were seen  in the left subclavian artery. Past Medical History:  Diagnosis Date   Arthritis    Asthma    Complication of anesthesia    surgery in 2023 - pt got stuck 5 times for spinal   Coronary artery disease    Cystocele 01/19/2013   GERD (gastroesophageal reflux disease)    Hypertension    Hypothyroidism    PONV (postoperative nausea and vomiting)    Stress incontinence 01/19/2013   Thyroid  disease     Past Surgical History:  Procedure Laterality Date   ABDOMINAL HYSTERECTOMY     ARTHROSCOPY KNEE W/ DRILLING Right    breast  biopsy Left 04/05/2020   BREAST BIOPSY Left 04/05/2020   CATARACT EXTRACTION     CHOLECYSTECTOMY     DILATION AND CURETTAGE OF UTERUS     HAND SURGERY Right    screws put in fingers   INCISION AND DRAINAGE OF WOUND Right 02/12/2022   Procedure: IRRIGATION AND DEBRIDEMENT WOUND, RIGHT KNEE;  Surgeon: Burnetta Aures, MD;  Location: [facility];  Service: Orthopedics;  Laterality: Right;   torn ligaments in left arm      TOTAL KNEE ARTHROPLASTY Right 01/26/2022   Procedure: TOTAL KNEE ARTHROPLASTY;  Surgeon: April Lerner, MD;  Location: [facility];  Service: Orthopedics;  Laterality: Right;   TUBAL LIGATION     WRIST SURGERY Left     MEDICATIONS:  acetaminophen  (TYLENOL ) 500 MG tablet   EPINEPHrine  0.3 mg/0.3 mL IJ SOAJ injection   levothyroxine  (SYNTHROID ) 100 MCG tablet   losartan  (COZAAR ) 50 MG tablet   No current facility-administered medications for this encounter.   Burnard CHRISTELLA Odis DEVONNA [facility] Short Stay/Anesthesiology [facility] Phone [phone] 10/05/2024 9:33 AM         "

## 2024-10-05 NOTE — Anesthesia Preprocedure Evaluation (Addendum)
"                                    Anesthesia Evaluation  Patient identified by MRN, date of birth, ID band Patient awake    Reviewed: Allergy & Precautions, NPO status , Patient's Chart, lab work & pertinent test results, reviewed documented beta blocker date and time   History of Anesthesia Complications (+) PONV, DIFFICULT AIRWAY and history of anesthetic complications  Airway Mallampati: III  TM Distance: >3 FB     Dental no notable dental hx.    Pulmonary neg shortness of breath, asthma    breath sounds clear to auscultation       Cardiovascular hypertension, (-) angina + CAD  (-) Past MI, (-) Cardiac Stents and (-) CABG  Rhythm:Regular Rate:Normal     Neuro/Psych neg Seizures  Anxiety        GI/Hepatic ,GERD  ,,  Endo/Other  Hypothyroidism    Renal/GU      Musculoskeletal  (+) Arthritis , Osteoarthritis,    Abdominal   Peds  Hematology   Anesthesia Other Findings   Reproductive/Obstetrics                              Anesthesia Physical Anesthesia Plan  ASA: 2  Anesthesia Plan: MAC and Spinal   Post-op Pain Management: Regional block*   Induction: Intravenous  PONV Risk Score and Plan: 3 and Ondansetron  and Propofol  infusion  Airway Management Planned: Natural Airway and Simple Face Mask  Additional Equipment:   Intra-op Plan:   Post-operative Plan:   Informed Consent: I have reviewed the patients History and Physical, chart, labs and discussed the procedure including the risks, benefits and alternatives for the proposed anesthesia with the patient or authorized representative who has indicated his/her understanding and acceptance.     Dental advisory given  Plan Discussed with: CRNA  Anesthesia Plan Comments: (See PAT note from 1/28)         Anesthesia Quick Evaluation  "

## 2024-10-09 ENCOUNTER — Ambulatory Visit (HOSPITAL_COMMUNITY): Admitting: Anesthesiology

## 2024-10-09 ENCOUNTER — Encounter (HOSPITAL_COMMUNITY): Payer: Self-pay | Admitting: Orthopedic Surgery

## 2024-10-09 ENCOUNTER — Other Ambulatory Visit: Payer: Self-pay

## 2024-10-09 ENCOUNTER — Encounter (HOSPITAL_COMMUNITY): Admission: RE | Disposition: A | Payer: Self-pay | Source: Home / Self Care | Attending: Orthopedic Surgery

## 2024-10-09 ENCOUNTER — Ambulatory Visit (HOSPITAL_COMMUNITY): Payer: Self-pay | Admitting: Medical

## 2024-10-09 ENCOUNTER — Observation Stay (HOSPITAL_COMMUNITY)
Admission: RE | Admit: 2024-10-09 | Discharge: 2024-10-10 | Disposition: A | Attending: Orthopedic Surgery | Admitting: Orthopedic Surgery

## 2024-10-09 DIAGNOSIS — J449 Chronic obstructive pulmonary disease, unspecified: Secondary | ICD-10-CM | POA: Insufficient documentation

## 2024-10-09 DIAGNOSIS — M1712 Unilateral primary osteoarthritis, left knee: Secondary | ICD-10-CM

## 2024-10-09 DIAGNOSIS — M179 Osteoarthritis of knee, unspecified: Principal | ICD-10-CM | POA: Diagnosis present

## 2024-10-09 DIAGNOSIS — Z96651 Presence of right artificial knee joint: Secondary | ICD-10-CM | POA: Insufficient documentation

## 2024-10-09 DIAGNOSIS — Z79899 Other long term (current) drug therapy: Secondary | ICD-10-CM | POA: Insufficient documentation

## 2024-10-09 DIAGNOSIS — E039 Hypothyroidism, unspecified: Secondary | ICD-10-CM | POA: Insufficient documentation

## 2024-10-09 DIAGNOSIS — I2510 Atherosclerotic heart disease of native coronary artery without angina pectoris: Secondary | ICD-10-CM | POA: Insufficient documentation

## 2024-10-09 DIAGNOSIS — Z01818 Encounter for other preprocedural examination: Secondary | ICD-10-CM

## 2024-10-09 DIAGNOSIS — I10 Essential (primary) hypertension: Secondary | ICD-10-CM | POA: Insufficient documentation

## 2024-10-09 MED ORDER — ACETAMINOPHEN 10 MG/ML IV SOLN
1000.0000 mg | Freq: Four times a day (QID) | INTRAVENOUS | Status: DC
  Administered 2024-10-09: 1000 mg via INTRAVENOUS
  Filled 2024-10-09: qty 100

## 2024-10-09 MED ORDER — METHOCARBAMOL 1000 MG/10ML IJ SOLN: 500.0000 mg | Freq: Four times a day (QID) | INTRAMUSCULAR | Status: DC | PRN

## 2024-10-09 MED ORDER — ACETAMINOPHEN 10 MG/ML IV SOLN
INTRAVENOUS | Status: AC
  Filled 2024-10-09: qty 100

## 2024-10-09 MED ORDER — FENTANYL CITRATE (PF) 50 MCG/ML IJ SOSY
50.0000 ug | PREFILLED_SYRINGE | Freq: Once | INTRAMUSCULAR | Status: AC
  Administered 2024-10-09: 50 ug via INTRAVENOUS
  Filled 2024-10-09: qty 2

## 2024-10-09 MED ORDER — ACETAMINOPHEN 500 MG PO TABS
1000.0000 mg | ORAL_TABLET | Freq: Four times a day (QID) | ORAL | Status: DC
  Administered 2024-10-09 – 2024-10-10 (×3): 1000 mg via ORAL
  Filled 2024-10-09 (×4): qty 2

## 2024-10-09 MED ORDER — BUPIVACAINE LIPOSOME 1.3 % IJ SUSP
INTRAMUSCULAR | Status: AC
  Filled 2024-10-09: qty 20

## 2024-10-09 MED ORDER — HYDRALAZINE HCL 20 MG/ML IJ SOLN: 10.0000 mg | Freq: Four times a day (QID) | INTRAMUSCULAR | Status: DC | PRN

## 2024-10-09 MED ORDER — LEVOTHYROXINE SODIUM 100 MCG PO TABS
100.0000 ug | ORAL_TABLET | Freq: Every day | ORAL | Status: DC
  Administered 2024-10-10: 100 ug via ORAL
  Filled 2024-10-09: qty 1

## 2024-10-09 MED ORDER — SODIUM CHLORIDE 0.9 % IR SOLN
Status: DC | PRN
  Administered 2024-10-09: 1000 mL

## 2024-10-09 MED ORDER — OXYCODONE HCL 5 MG PO TABS
ORAL_TABLET | ORAL | Status: AC
  Filled 2024-10-09: qty 1

## 2024-10-09 MED ORDER — DEXAMETHASONE SOD PHOSPHATE PF 10 MG/ML IJ SOLN
INTRAMUSCULAR | Status: DC | PRN
  Administered 2024-10-09: 10 mg via PERINEURAL

## 2024-10-09 MED ORDER — PHENOL 1.4 % MT LIQD: 1.0000 | OROMUCOSAL | Status: DC | PRN

## 2024-10-09 MED ORDER — DEXAMETHASONE SOD PHOSPHATE PF 10 MG/ML IJ SOLN
10.0000 mg | Freq: Once | INTRAMUSCULAR | Status: AC
  Administered 2024-10-10: 10 mg via INTRAVENOUS
  Filled 2024-10-09: qty 1

## 2024-10-09 MED ORDER — BISACODYL 10 MG RE SUPP: 10.0000 mg | Freq: Every day | RECTAL | Status: DC | PRN

## 2024-10-09 MED ORDER — ONDANSETRON HCL 4 MG/2ML IJ SOLN
INTRAMUSCULAR | Status: AC
  Filled 2024-10-09: qty 2

## 2024-10-09 MED ORDER — METOCLOPRAMIDE HCL 5 MG PO TABS: 5.0000 mg | ORAL_TABLET | Freq: Three times a day (TID) | ORAL | Status: DC | PRN

## 2024-10-09 MED ORDER — OXYCODONE HCL 5 MG/5ML PO SOLN: 5.0000 mg | Freq: Once | ORAL | Status: AC | PRN

## 2024-10-09 MED ORDER — ONDANSETRON HCL 4 MG/2ML IJ SOLN
INTRAMUSCULAR | Status: DC | PRN
  Administered 2024-10-09: 4 mg via INTRAVENOUS

## 2024-10-09 MED ORDER — MENTHOL 3 MG MT LOZG: 1.0000 | LOZENGE | OROMUCOSAL | Status: DC | PRN

## 2024-10-09 MED ORDER — SODIUM CHLORIDE 0.9 % IV SOLN: INTRAVENOUS | Status: DC

## 2024-10-09 MED ORDER — METOCLOPRAMIDE HCL 5 MG/ML IJ SOLN
5.0000 mg | Freq: Three times a day (TID) | INTRAMUSCULAR | Status: DC | PRN
  Administered 2024-10-09: 10 mg via INTRAVENOUS
  Filled 2024-10-09: qty 2

## 2024-10-09 MED ORDER — SENNA 8.6 MG PO TABS
1.0000 | ORAL_TABLET | Freq: Every day | ORAL | Status: DC
  Administered 2024-10-09 – 2024-10-10 (×2): 8.6 mg via ORAL
  Filled 2024-10-09 (×2): qty 1

## 2024-10-09 MED ORDER — CHLORHEXIDINE GLUCONATE 0.12 % MT SOLN
15.0000 mL | Freq: Once | OROMUCOSAL | Status: AC
  Administered 2024-10-09: 15 mL via OROMUCOSAL

## 2024-10-09 MED ORDER — FENTANYL CITRATE (PF) 100 MCG/2ML IJ SOLN
INTRAMUSCULAR | Status: DC | PRN
  Administered 2024-10-09 (×2): 50 ug via INTRAVENOUS

## 2024-10-09 MED ORDER — FENTANYL CITRATE (PF) 50 MCG/ML IJ SOSY
PREFILLED_SYRINGE | INTRAMUSCULAR | Status: AC
  Filled 2024-10-09: qty 1

## 2024-10-09 MED ORDER — ONDANSETRON HCL 4 MG/2ML IJ SOLN
4.0000 mg | Freq: Once | INTRAMUSCULAR | Status: AC | PRN
  Administered 2024-10-09: 4 mg via INTRAVENOUS

## 2024-10-09 MED ORDER — ASPIRIN 81 MG PO CHEW
81.0000 mg | CHEWABLE_TABLET | Freq: Two times a day (BID) | ORAL | Status: DC
  Administered 2024-10-10: 81 mg via ORAL
  Filled 2024-10-09: qty 1

## 2024-10-09 MED ORDER — HYDROMORPHONE HCL 1 MG/ML IJ SOLN
0.5000 mg | INTRAMUSCULAR | Status: DC | PRN
  Administered 2024-10-09: 1 mg via INTRAVENOUS
  Filled 2024-10-09: qty 1

## 2024-10-09 MED ORDER — TRAMADOL HCL 50 MG PO TABS
50.0000 mg | ORAL_TABLET | Freq: Four times a day (QID) | ORAL | Status: DC | PRN
  Administered 2024-10-09 – 2024-10-10 (×3): 100 mg via ORAL
  Filled 2024-10-09 (×3): qty 2

## 2024-10-09 MED ORDER — OXYCODONE HCL 5 MG PO TABS
5.0000 mg | ORAL_TABLET | Freq: Once | ORAL | Status: AC | PRN
  Administered 2024-10-09: 5 mg via ORAL

## 2024-10-09 MED ORDER — FENTANYL CITRATE (PF) 100 MCG/2ML IJ SOLN
INTRAMUSCULAR | Status: AC
  Filled 2024-10-09: qty 2

## 2024-10-09 MED ORDER — SODIUM CHLORIDE (PF) 0.9 % IJ SOLN
INTRAMUSCULAR | Status: DC | PRN
  Administered 2024-10-09: 80 mL

## 2024-10-09 MED ORDER — BUPIVACAINE LIPOSOME 1.3 % IJ SUSP: 20.0000 mL | Freq: Once | INTRAMUSCULAR | Status: DC

## 2024-10-09 MED ORDER — PHENYLEPHRINE HCL-NACL 20-0.9 MG/250ML-% IV SOLN
INTRAVENOUS | Status: DC | PRN
  Administered 2024-10-09: 25 ug/min via INTRAVENOUS

## 2024-10-09 MED ORDER — PROPOFOL 10 MG/ML IV BOLUS
INTRAVENOUS | Status: DC | PRN
  Administered 2024-10-09: 30 mg via INTRAVENOUS
  Administered 2024-10-09: 85 ug/kg/min via INTRAVENOUS

## 2024-10-09 MED ORDER — DEXAMETHASONE SOD PHOSPHATE PF 10 MG/ML IJ SOLN
8.0000 mg | Freq: Once | INTRAMUSCULAR | Status: AC
  Administered 2024-10-09: 8 mg via INTRAVENOUS

## 2024-10-09 MED ORDER — ACETAMINOPHEN 325 MG PO TABS: 325.0000 mg | ORAL_TABLET | Freq: Four times a day (QID) | ORAL | Status: DC | PRN

## 2024-10-09 MED ORDER — ACETAMINOPHEN 10 MG/ML IV SOLN: 1000.0000 mg | Freq: Once | INTRAVENOUS | Status: DC | PRN

## 2024-10-09 MED ORDER — CEFAZOLIN SODIUM-DEXTROSE 2-4 GM/100ML-% IV SOLN
2.0000 g | INTRAVENOUS | Status: AC
  Administered 2024-10-09: 2 g via INTRAVENOUS
  Filled 2024-10-09: qty 100

## 2024-10-09 MED ORDER — ONDANSETRON HCL 4 MG PO TABS
4.0000 mg | ORAL_TABLET | Freq: Four times a day (QID) | ORAL | Status: DC | PRN
  Filled 2024-10-09: qty 1

## 2024-10-09 MED ORDER — STERILE WATER FOR IRRIGATION IR SOLN
Status: DC | PRN
  Administered 2024-10-09: 2000 mL

## 2024-10-09 MED ORDER — FENTANYL CITRATE (PF) 50 MCG/ML IJ SOSY
25.0000 ug | PREFILLED_SYRINGE | INTRAMUSCULAR | Status: DC | PRN
  Administered 2024-10-09: 50 ug via INTRAVENOUS

## 2024-10-09 MED ORDER — OXYCODONE HCL 5 MG PO TABS
5.0000 mg | ORAL_TABLET | ORAL | Status: DC | PRN
  Filled 2024-10-09: qty 2

## 2024-10-09 MED ORDER — SODIUM CHLORIDE (PF) 0.9 % IJ SOLN
INTRAMUSCULAR | Status: AC
  Filled 2024-10-09: qty 10

## 2024-10-09 MED ORDER — MEPIVACAINE HCL (PF) 2 % IJ SOLN
INTRAMUSCULAR | Status: AC
  Filled 2024-10-09: qty 60

## 2024-10-09 MED ORDER — EPHEDRINE SULFATE-NACL 50-0.9 MG/10ML-% IV SOSY
PREFILLED_SYRINGE | INTRAVENOUS | Status: DC | PRN
  Administered 2024-10-09: 10 mg via INTRAVENOUS
  Administered 2024-10-09: 5 mg via INTRAVENOUS

## 2024-10-09 MED ORDER — MEPIVACAINE HCL (PF) 2 % IJ SOLN
INTRAMUSCULAR | Status: DC | PRN
  Administered 2024-10-09: 3 mL via INTRATHECAL

## 2024-10-09 MED ORDER — SODIUM CHLORIDE (PF) 0.9 % IJ SOLN
INTRAMUSCULAR | Status: AC
  Filled 2024-10-09: qty 50

## 2024-10-09 MED ORDER — FLEET ENEMA RE ENEM: 1.0000 | ENEMA | Freq: Once | RECTAL | Status: DC | PRN

## 2024-10-09 MED ORDER — 0.9 % SODIUM CHLORIDE (POUR BTL) OPTIME
TOPICAL | Status: DC | PRN
  Administered 2024-10-09: 1000 mL

## 2024-10-09 MED ORDER — LOSARTAN POTASSIUM 50 MG PO TABS
50.0000 mg | ORAL_TABLET | Freq: Every day | ORAL | Status: DC
  Administered 2024-10-10: 50 mg via ORAL
  Filled 2024-10-09: qty 1

## 2024-10-09 MED ORDER — ONDANSETRON HCL 4 MG/2ML IJ SOLN
4.0000 mg | Freq: Four times a day (QID) | INTRAMUSCULAR | Status: DC | PRN
  Administered 2024-10-09 – 2024-10-10 (×2): 4 mg via INTRAVENOUS
  Filled 2024-10-09 (×2): qty 2

## 2024-10-09 MED ORDER — ROPIVACAINE HCL 5 MG/ML IJ SOLN
INTRAMUSCULAR | Status: DC | PRN
  Administered 2024-10-09: 15 mL via PERINEURAL

## 2024-10-09 MED ORDER — ORAL CARE MOUTH RINSE: 15.0000 mL | Freq: Once | OROMUCOSAL | Status: AC

## 2024-10-09 MED ORDER — POLYETHYLENE GLYCOL 3350 17 G PO PACK: 17.0000 g | PACK | Freq: Every day | ORAL | Status: DC | PRN

## 2024-10-09 MED ORDER — POVIDONE-IODINE 10 % EX SWAB: 2.0000 | Freq: Once | CUTANEOUS | Status: DC

## 2024-10-09 MED ORDER — METHOCARBAMOL 500 MG PO TABS: 500.0000 mg | ORAL_TABLET | Freq: Four times a day (QID) | ORAL | Status: DC | PRN

## 2024-10-09 MED ORDER — DIPHENHYDRAMINE HCL 12.5 MG/5ML PO ELIX: 12.5000 mg | ORAL_SOLUTION | ORAL | Status: DC | PRN

## 2024-10-09 MED ORDER — CEFAZOLIN SODIUM-DEXTROSE 2-4 GM/100ML-% IV SOLN
2.0000 g | Freq: Four times a day (QID) | INTRAVENOUS | Status: AC
  Administered 2024-10-09 (×2): 2 g via INTRAVENOUS
  Filled 2024-10-09 (×2): qty 100

## 2024-10-09 MED ORDER — PHENYLEPHRINE 80 MCG/ML (10ML) SYRINGE FOR IV PUSH (FOR BLOOD PRESSURE SUPPORT)
PREFILLED_SYRINGE | INTRAVENOUS | Status: DC | PRN
  Administered 2024-10-09: 80 ug via INTRAVENOUS

## 2024-10-09 MED ORDER — LACTATED RINGERS IV SOLN: INTRAVENOUS | Status: DC

## 2024-10-09 MED ORDER — TRANEXAMIC ACID-NACL 1000-0.7 MG/100ML-% IV SOLN
1000.0000 mg | INTRAVENOUS | Status: AC
  Administered 2024-10-09: 1000 mg via INTRAVENOUS
  Filled 2024-10-09: qty 100

## 2024-10-09 MED ORDER — MIDAZOLAM HCL (PF) 2 MG/2ML IJ SOLN
1.0000 mg | Freq: Once | INTRAMUSCULAR | Status: AC
  Administered 2024-10-09: 1 mg via INTRAVENOUS
  Filled 2024-10-09: qty 2

## 2024-10-09 NOTE — Anesthesia Procedure Notes (Signed)
 Date/Time: 10/09/2024 11:23 AM  Performed by: Para Jerelene CROME, CRNAOxygen Delivery Method: Simple face mask

## 2024-10-09 NOTE — Discharge Instructions (Signed)
Gaynelle Arabian, MD [facility] Specialist [facility] [place] [address], [address] [place], [place] [address] [phone]  TOTAL KNEE REPLACEMENT POSTOPERATIVE DIRECTIONS    Knee Rehabilitation, Guidelines Following Surgery  Results after knee surgery are often greatly improved when you follow the exercise, range of motion and muscle strengthening exercises prescribed by your doctor. Safety measures are also important to protect the knee from further injury. If any of these exercises cause you to have increased pain or swelling in your knee joint, decrease the amount until you are comfortable again and slowly increase them. If you have problems or questions, call your caregiver or physical therapist for advice.   BLOOD CLOT PREVENTION Take an 81 mg Aspirin two times a day for three weeks following surgery. Then take an 81 mg Aspirin once a day for three weeks. Then discontinue Aspirin. You may resume your vitamins/supplements upon discharge from the hospital. Do not take any NSAIDs (Advil, Aleve, Ibuprofen, Meloxicam, etc.) until you are 3 weeks out from surgery.  HOME CARE INSTRUCTIONS  Remove items at home which could result in a fall. This includes throw rugs or furniture in walking pathways.  ICE to the affected knee as much as tolerated. Icing helps control swelling. If the swelling is well controlled you will be more comfortable and rehab easier. Continue to use ice on the knee for pain and swelling from surgery. You may notice swelling that will progress down to the foot and ankle. This is normal after surgery. Elevate the leg when you are not up walking on it.    Continue to use the breathing machine which will help keep your temperature down. It is common for your temperature to cycle up and down following surgery, especially at night when you are not up moving around and exerting yourself. The breathing machine keeps your lungs expanded and your temperature down. Do  not place pillow under the operative knee, focus on keeping the knee straight while resting  DIET You may resume your previous home diet once you are discharged from the hospital.  DRESSING / WOUND CARE / SHOWERING Keep your bulky bandage on for 2 days. On the third post-operative day you may remove the Ace bandage and gauze. There is a waterproof adhesive bandage on your skin which will stay in place until your first follow-up appointment. Once you remove this you will not need to place another bandage You may begin showering 3 days following surgery, but do not submerge the incision under water.  ACTIVITY For the first 5 days, the key is rest and control of pain and swelling Do your home exercises twice a day starting on post-operative day 3. On the days you go to physical therapy, just do the home exercises once that day. You should rest, ice and elevate the leg for 50 minutes out of every hour. Get up and walk/stretch for 10 minutes per hour. After 5 days you can increase your activity slowly as tolerated. Walk with your walker as instructed. Use the walker until you are comfortable transitioning to a cane. Walk with the cane in the opposite hand of the operative leg. You may discontinue the cane once you are comfortable and walking steadily. Avoid periods of inactivity such as sitting longer than an hour when not asleep. This helps prevent blood clots.  You may discontinue the knee immobilizer once you are able to perform a straight leg raise while lying down. You may resume a sexual relationship in one month  or when given the OK by your doctor.  You may return to work once you are cleared by your doctor.  Do not drive a car for 6 weeks or until released by your surgeon.  Do not drive while taking narcotics.  TED HOSE STOCKINGS Wear the elastic stockings on both legs for three weeks following surgery during the day. You may remove them at night for sleeping.  WEIGHT BEARING Weight  bearing as tolerated with assist device (walker, cane, etc) as directed, use it as long as suggested by your surgeon or therapist, typically at least 4-6 weeks.  POSTOPERATIVE CONSTIPATION PROTOCOL Constipation - defined medically as fewer than three stools per week and severe constipation as less than one stool per week.  One of the most common issues patients have following surgery is constipation.  Even if you have a regular bowel pattern at home, your normal regimen is likely to be disrupted due to multiple reasons following surgery.  Combination of anesthesia, postoperative narcotics, change in appetite and fluid intake all can affect your bowels.  In order to avoid complications following surgery, here are some recommendations in order to help you during your recovery period.  Colace (docusate) - Pick up an over-the-counter form of Colace or another stool softener and take twice a day as long as you are requiring postoperative pain medications.  Take with a full glass of water daily.  If you experience loose stools or diarrhea, hold the colace until you stool forms back up. If your symptoms do not get better within 1 week or if they get worse, check with your doctor. Dulcolax (bisacodyl) - Pick up over-the-counter and take as directed by the product packaging as needed to assist with the movement of your bowels.  Take with a full glass of water.  Use this product as needed if not relieved by Colace only.  MiraLax (polyethylene glycol) - Pick up over-the-counter to have on hand. MiraLax is a solution that will increase the amount of water in your bowels to assist with bowel movements.  Take as directed and can mix with a glass of water, juice, soda, coffee, or tea. Take if you go more than two days without a movement. Do not use MiraLax more than once per day. Call your doctor if you are still constipated or irregular after using this medication for 7 days in a row.  If you continue to have problems  with postoperative constipation, please contact the office for further assistance and recommendations.  If you experience "the worst abdominal pain ever" or develop nausea or vomiting, please contact the office immediatly for further recommendations for treatment.  ITCHING If you experience itching with your medications, try taking only a single pain pill, or even half a pain pill at a time.  You can also use Benadryl over the counter for itching or also to help with sleep.   MEDICATIONS See your medication summary on the "After Visit Summary" that the nursing staff will review with you prior to discharge.  You may have some home medications which will be placed on hold until you complete the course of blood thinner medication.  It is important for you to complete the blood thinner medication as prescribed by your surgeon.  Continue your approved medications as instructed at time of discharge.  PRECAUTIONS If you experience chest pain or shortness of breath - call 911 immediately for transfer to the hospital emergency department.  If you develop a fever greater that 101 F,  purulent drainage from wound, increased redness or drainage from wound, foul odor from the wound/dressing, or calf pain - CONTACT YOUR SURGEON.                                                   FOLLOW-UP APPOINTMENTS Make sure you keep all of your appointments after your operation with your surgeon and caregivers. You should call the office at the above phone number and make an appointment for approximately two weeks after the date of your surgery or on the date instructed by your surgeon outlined in the "After Visit Summary".  RANGE OF MOTION AND STRENGTHENING EXERCISES  Rehabilitation of the knee is important following a knee injury or an operation. After just a few days of immobilization, the muscles of the thigh which control the knee become weakened and shrink (atrophy). Knee exercises are designed to build up the tone and  strength of the thigh muscles and to improve knee motion. Often times heat used for twenty to thirty minutes before working out will loosen up your tissues and help with improving the range of motion but do not use heat for the first two weeks following surgery. These exercises can be done on a training (exercise) mat, on the floor, on a table or on a bed. Use what ever works the best and is most comfortable for you Knee exercises include:  Leg Lifts - While your knee is still immobilized in a splint or cast, you can do straight leg raises. Lift the leg to 60 degrees, hold for 3 sec, and slowly lower the leg. Repeat 10-20 times 2-3 times daily. Perform this exercise against resistance later as your knee gets better.  Quad and Hamstring Sets - Tighten up the muscle on the front of the thigh (Quad) and hold for 5-10 sec. Repeat this 10-20 times hourly. Hamstring sets are done by pushing the foot backward against an object and holding for 5-10 sec. Repeat as with quad sets.  Leg Slides: Lying on your back, slowly slide your foot toward your buttocks, bending your knee up off the floor (only go as far as is comfortable). Then slowly slide your foot back down until your leg is flat on the floor again. Angel Wings: Lying on your back spread your legs to the side as far apart as you can without causing discomfort.  A rehabilitation program following serious knee injuries can speed recovery and prevent re-injury in the future due to weakened muscles. Contact your doctor or a physical therapist for more information on knee rehabilitation.   POST-OPERATIVE OPIOID TAPER INSTRUCTIONS: It is important to wean off of your opioid medication as soon as possible. If you do not need pain medication after your surgery it is ok to stop day one. Opioids include: Codeine, Hydrocodone(Norco, Vicodin), Oxycodone(Percocet, oxycontin) and hydromorphone amongst others.  Long term and even short term use of opiods can  cause: Increased pain response Dependence Constipation Depression Respiratory depression And more.  Withdrawal symptoms can include Flu like symptoms Nausea, vomiting And more Techniques to manage these symptoms Hydrate well Eat regular healthy meals Stay active Use relaxation techniques(deep breathing, meditating, yoga) Do Not substitute Alcohol to help with tapering If you have been on opioids for less than two weeks and do not have pain than it is ok to stop all together.  Plan  to wean off of opioids This plan should start within one week post op of your joint replacement. Maintain the same interval or time between taking each dose and first decrease the dose.  Cut the total daily intake of opioids by one tablet each day Next start to increase the time between doses. The last dose that should be eliminated is the evening dose.   IF YOU ARE TRANSFERRED TO A SKILLED REHAB FACILITY If the patient is transferred to a skilled rehab facility following release from the hospital, a list of the current medications will be sent to the facility for the patient to continue.  When discharged from the skilled rehab facility, please have the facility set up the patient's Home Health Physical Therapy prior to being released. Also, the skilled facility will be responsible for providing the patient with their medications at time of release from the facility to include their pain medication, the muscle relaxants, and their blood thinner medication. If the patient is still at the rehab facility at time of the two week follow up appointment, the skilled rehab facility will also need to assist the patient in arranging follow up appointment in our office and any transportation needs.  MAKE SURE YOU:  Understand these instructions.  Get help right away if you are not doing well or get worse.   DENTAL ANTIBIOTICS:  In most cases prophylactic antibiotics for Dental procdeures after total joint surgery are  not necessary.  Exceptions are as follows:  1. History of prior total joint infection  2. Severely immunocompromised (Organ Transplant, cancer chemotherapy, Rheumatoid biologic meds such as Humera)  3. Poorly controlled diabetes (A1C &gt; 8.0, blood glucose over 200)  If you have one of these conditions, contact your surgeon for an antibiotic prescription, prior to your dental procedure.    Pick up stool softner and laxative for home use following surgery while on pain medications. Do not submerge incision under water. Please use good hand washing techniques while changing dressing each day. May shower starting three days after surgery. Please use a clean towel to pat the incision dry following showers. Continue to use ice for pain and swelling after surgery. Do not use any lotions or creams on the incision until instructed by your surgeon.  

## 2024-10-09 NOTE — Plan of Care (Signed)
" °  Problem: Education: Goal: Knowledge of General Education information will improve Description: Including pain rating scale, medication(s)/side effects and non-pharmacologic comfort measures Outcome: Progressing   Problem: Health Behavior/Discharge Planning: Goal: Ability to manage health-related needs will improve Outcome: Progressing   Problem: Clinical Measurements: Goal: Ability to maintain clinical measurements within normal limits will improve Outcome: Progressing Goal: Will remain free from infection Outcome: Progressing Goal: Diagnostic test results will improve Outcome: Progressing Goal: Respiratory complications will improve Outcome: Progressing Goal: Cardiovascular complication will be avoided Outcome: Progressing   Problem: Activity: Goal: Risk for activity intolerance will decrease Outcome: Progressing   Problem: Nutrition: Goal: Adequate nutrition will be maintained Outcome: Progressing   Problem: Coping: Goal: Level of anxiety will decrease Outcome: Progressing   Problem: Elimination: Goal: Will not experience complications related to bowel motility Outcome: Progressing Goal: Will not experience complications related to urinary retention Outcome: Progressing   Problem: Pain Managment: Goal: General experience of comfort will improve and/or be controlled Outcome: Progressing   Problem: Safety: Goal: Ability to remain free from injury will improve Outcome: Progressing   Problem: Skin Integrity: Goal: Risk for impaired skin integrity will decrease Outcome: Progressing   Problem: Education: Goal: Knowledge of the prescribed therapeutic regimen will improve Outcome: Progressing   Problem: Bowel/Gastric: Goal: Gastrointestinal status for postoperative course will improve Outcome: Progressing   Problem: Cardiac: Goal: Ability to maintain an adequate cardiac output Outcome: Progressing Goal: Will show no evidence of cardiac arrhythmias Outcome:  Progressing   Problem: Nutritional: Goal: Will attain and maintain optimal nutritional status Outcome: Progressing   Problem: Neurological: Goal: Will regain or maintain usual level of consciousness Outcome: Progressing   Problem: Clinical Measurements: Goal: Ability to maintain clinical measurements within normal limits Outcome: Progressing Goal: Postoperative complications will be avoided or minimized Outcome: Progressing   Problem: Respiratory: Goal: Will regain and/or maintain adequate ventilation Outcome: Progressing Goal: Respiratory status will improve Outcome: Progressing   Problem: Skin Integrity: Goal: Demonstrates signs of wound healing without infection Outcome: Progressing   Problem: Urinary Elimination: Goal: Will remain free from infection Outcome: Progressing Goal: Ability to achieve and maintain adequate urine output Outcome: Progressing   Problem: Education: Goal: Knowledge of the prescribed therapeutic regimen will improve Outcome: Progressing Goal: Individualized Educational Video(s) Outcome: Progressing   Problem: Activity: Goal: Ability to avoid complications of mobility impairment will improve Outcome: Progressing Goal: Range of joint motion will improve Outcome: Progressing   Problem: Clinical Measurements: Goal: Postoperative complications will be avoided or minimized Outcome: Progressing   Problem: Pain Management: Goal: Pain level will decrease with appropriate interventions Outcome: Progressing   Problem: Skin Integrity: Goal: Will show signs of wound healing Outcome: Progressing   "

## 2024-10-09 NOTE — Anesthesia Procedure Notes (Signed)
 Anesthesia Regional Block: Adductor canal block   Pre-Anesthetic Checklist: , timeout performed,  Correct Patient, Correct Site, Correct Laterality,  Correct Procedure, Correct Position, site marked,  Risks and benefits discussed,  Surgical consent,  Pre-op evaluation,  At surgeon's request and post-op pain management  Laterality: Left  Prep: Maximum Sterile Barrier Precautions used, chloraprep       Needles:  Injection technique: Single-shot  Needle Type: Echogenic Needle      Needle Gauge: 20     Additional Needles:   Procedures:,,,, ultrasound used (permanent image in chart),,    Narrative:  Start time: 10/09/2024 11:00 AM End time: 10/09/2024 11:03 AM Injection made incrementally with aspirations every 5 mL.  Performed by: Personally  Anesthesiologist: Keneth Lynwood POUR, MD

## 2024-10-09 NOTE — Transfer of Care (Signed)
 Immediate Anesthesia Transfer of Care Note  Patient: April Lang  Procedure(s) Performed: ARTHROPLASTY, KNEE, TOTAL (Left: Knee)  Patient Location: PACU  Anesthesia Type:Spinal  Level of Consciousness: awake and patient cooperative  Airway & Oxygen Therapy: Patient Spontanous Breathing and Patient connected to nasal cannula oxygen  Post-op Assessment: Report given to RN and Post -op Vital signs reviewed and stable  Post vital signs: Reviewed and stable  Last Vitals:  Vitals Value Taken Time  BP 193/65 10/09/24 13:20  Temp 36.4 C 10/09/24 13:16  Pulse 66 10/09/24 13:25  Resp 13 10/09/24 13:25  SpO2 100 % 10/09/24 13:25  Vitals shown include unfiled device data.  Last Pain:  Vitals:   10/09/24 1316  TempSrc:   PainSc: 4          Complications: No notable events documented.

## 2024-10-09 NOTE — Plan of Care (Signed)
  Problem: Education: Goal: Knowledge of General Education information will improve Description: Including pain rating scale, medication(s)/side effects and non-pharmacologic comfort measures Outcome: Progressing   Problem: Pain Managment: Goal: General experience of comfort will improve and/or be controlled Outcome: Progressing   Problem: Safety: Goal: Ability to remain free from injury will improve Outcome: Progressing   Problem: Pain Management: Goal: Pain level will decrease with appropriate interventions Outcome: Progressing

## 2024-10-10 ENCOUNTER — Other Ambulatory Visit (HOSPITAL_COMMUNITY): Payer: Self-pay

## 2024-10-10 ENCOUNTER — Encounter (HOSPITAL_COMMUNITY): Payer: Self-pay | Admitting: Orthopedic Surgery

## 2024-10-10 LAB — BASIC METABOLIC PANEL WITH GFR
Anion gap: 13 (ref 5–15)
BUN: 16 mg/dL (ref 8–23)
CO2: 23 mmol/L (ref 22–32)
Calcium: 9 mg/dL (ref 8.9–10.3)
Chloride: 99 mmol/L (ref 98–111)
Creatinine, Ser: 0.98 mg/dL (ref 0.44–1.00)
GFR, Estimated: 57 mL/min — ABNORMAL LOW
Glucose, Bld: 148 mg/dL — ABNORMAL HIGH (ref 70–99)
Potassium: 4.7 mmol/L (ref 3.5–5.1)
Sodium: 135 mmol/L (ref 135–145)

## 2024-10-10 LAB — CBC
HCT: 34.5 % — ABNORMAL LOW (ref 36.0–46.0)
Hemoglobin: 11.2 g/dL — ABNORMAL LOW (ref 12.0–15.0)
MCH: 32.3 pg (ref 26.0–34.0)
MCHC: 32.5 g/dL (ref 30.0–36.0)
MCV: 99.4 fL (ref 80.0–100.0)
Platelets: 244 10*3/uL (ref 150–400)
RBC: 3.47 MIL/uL — ABNORMAL LOW (ref 3.87–5.11)
RDW: 12.3 % (ref 11.5–15.5)
WBC: 14.4 10*3/uL — ABNORMAL HIGH (ref 4.0–10.5)
nRBC: 0 % (ref 0.0–0.2)

## 2024-10-10 MED ORDER — TRAMADOL HCL 50 MG PO TABS
50.0000 mg | ORAL_TABLET | Freq: Four times a day (QID) | ORAL | 0 refills | Status: AC | PRN
  Filled 2024-10-10: qty 40, 5d supply, fill #0

## 2024-10-10 MED ORDER — METHOCARBAMOL 500 MG PO TABS
500.0000 mg | ORAL_TABLET | Freq: Four times a day (QID) | ORAL | 0 refills | Status: AC | PRN
  Filled 2024-10-10: qty 40, 10d supply, fill #0

## 2024-10-10 MED ORDER — ASPIRIN 81 MG PO CHEW
81.0000 mg | CHEWABLE_TABLET | Freq: Two times a day (BID) | ORAL | 0 refills | Status: AC
  Filled 2024-10-10: qty 63, 42d supply, fill #0

## 2024-10-10 MED ORDER — ONDANSETRON HCL 4 MG PO TABS
4.0000 mg | ORAL_TABLET | Freq: Four times a day (QID) | ORAL | 0 refills | Status: AC | PRN
  Filled 2024-10-10: qty 20, 5d supply, fill #0

## 2024-10-10 MED ORDER — OXYCODONE HCL 5 MG PO TABS
5.0000 mg | ORAL_TABLET | ORAL | 0 refills | Status: AC | PRN
  Filled 2024-10-10: qty 42, 7d supply, fill #0

## 2024-10-10 NOTE — Care Management Obs Status (Signed)
 MEDICARE OBSERVATION STATUS NOTIFICATION   Patient Details  Name: AHMYAH GIDLEY MRN: [id number] Date of Birth: 06/06/1941   Medicare Observation Status Notification Given:  Yes    NORMAN ASPEN, LCSW 10/10/2024, 11:28 AM

## 2024-10-10 NOTE — Progress Notes (Signed)
 "  Subjective: 1 Day Post-Op Procedures (LRB): ARTHROPLASTY, KNEE, TOTAL (Left) Patient reports pain as mild.   Patient seen in rounds by Dr. Melodi. Patient is well, and has had no acute complaints or problems. Had issues with nausea yesterday, improved this morning. Denies chest pain, SOB, or calf pain. Foley catheter removed this AM.  We will continue therapy today  Objective: Vital signs in last 24 hours: Temp:  [97.5 F (36.4 C)-98.3 F (36.8 C)] 98.1 F (36.7 C) (02/03 0456) Pulse Rate:  [62-73] 72 (02/03 0456) Resp:  [11-19] 16 (02/03 0456) BP: (153-188)/(54-71) 153/66 (02/03 0456) SpO2:  [94 %-100 %] 96 % (02/03 0456) Weight:  [63.5 kg] 63.5 kg (02/02 0947)  Intake/Output from previous day:  Intake/Output Summary (Last 24 hours) at 10/10/2024 0853 Last data filed at 10/10/2024 0615 Gross per 24 hour  Intake 3748.64 ml  Output 1225 ml  Net 2523.64 ml     Intake/Output this shift: No intake/output data recorded.  Labs: Recent Labs    10/10/24 0323  HGB 11.2*   Recent Labs    10/10/24 0323  WBC 14.4*  RBC 3.47*  HCT 34.5*  PLT 244   Recent Labs    10/10/24 0323  NA 135  K 4.7  CL 99  CO2 23  BUN 16  CREATININE 0.98  GLUCOSE 148*  CALCIUM  9.0   No results for input(s): LABPT, INR in the last 72 hours.  Exam: General - Patient is Alert and Oriented Extremity - Neurologically intact Neurovascular intact Sensation intact distally Dorsiflexion/Plantar flexion intact Dressing - dressing C/D/I Motor Function - intact, moving foot and toes well on exam.   Past Medical History:  Diagnosis Date   Arthritis    Asthma    Complication of anesthesia    surgery in 2023 - pt got stuck 5 times for spinal   Coronary artery disease    Cystocele 01/19/2013   Difficult intubation    during 02/2022 knee surgery   GERD (gastroesophageal reflux disease)    Hypertension    Hypothyroidism    PONV (postoperative nausea and vomiting)    Stress  incontinence 01/19/2013   Thyroid  disease     Assessment/Plan: 1 Day Post-Op Procedures (LRB): ARTHROPLASTY, KNEE, TOTAL (Left) Principal Problem:   OA (osteoarthritis) of knee Active Problems:   Primary osteoarthritis of left knee  Estimated body mass index is 24.03 kg/m as calculated from the following:   Height as of this encounter: 5' 4 (1.626 m).   Weight as of this encounter: 63.5 kg. Advance diet Up with therapy D/C IV fluids   Patient's anticipated LOS is less than 2 midnights, meeting these requirements: - Younger than 42 - Lives within 1 hour of care - Has a competent adult at home to recover with post-op recover - NO history of  - Chronic pain requiring opiods  - Diabetes  - Coronary Artery Disease  - Heart failure  - Heart attack  - Stroke  - DVT/VTE  - Cardiac arrhythmia  - Respiratory Failure/COPD  - Renal failure  - Anemia  - Advanced Liver disease   DVT Prophylaxis - Aspirin  Weight bearing as tolerated. Continue therapy.  Plan is to go Home after hospital stay. Plan for discharge later today if progresses with therapy and meeting goals. Scheduled for OPPT at [facility]. Follow-up in the office in 2 weeks.  The PDMP database was reviewed today prior to any opioid medications being prescribed to this patient.  Roxie Mess, PA-C Orthopedic  Surgery [phone] 10/10/2024, 8:53 AM  "

## 2024-10-10 NOTE — TOC Transition Note (Signed)
 Transition of Care [facility]) - Discharge Note   Patient Details  Name: April Lang MRN: [id number] Date of Birth: December 23, 1940  Transition [facility] CM/SW Contact:  NORMAN ASPEN, LCSW Phone Number: 10/10/2024, 11:20 AM   Clinical Narrative:     Met with pt who confirms she has needed DME in the home.  OPPT already arranged with [facility].  No further IP CM needs.  Final next level of care: OP Rehab Barriers to Discharge: No Barriers Identified   Patient Goals and CMS Choice Patient states their goals for this hospitalization and ongoing recovery are:: return home          Discharge Placement                       Discharge Plan and Services Additional resources added to the After Visit Summary for                  DME Arranged: N/A DME Agency: NA                  Social Drivers of Health (SDOH) Interventions SDOH Screenings   Food Insecurity: No Food Insecurity (10/09/2024)  Housing: Low Risk (10/09/2024)  Transportation Needs: No Transportation Needs (10/09/2024)  Utilities: Not At Risk (10/09/2024)  Depression (PHQ2-9): Low Risk (08/22/2024)  Financial Resource Strain: Low Risk (04/16/2024)  Physical Activity: Insufficiently Active (04/16/2024)  Social Connections: Moderately Integrated (10/09/2024)  Stress: Stress Concern Present (04/16/2024)  Tobacco Use: Low Risk (10/09/2024)     Readmission Risk Interventions     No data to display

## 2024-10-10 NOTE — Progress Notes (Signed)
 Reviewed discharge instructions with pt over the phone. All questions answered and pt comfortable with medication and wound care regimen. Reviewed upcoming appointments and symptoms of concern. Pt verbalized understanding and encouraged to contact office for any further needs.  Pt awaiting discharge meds and transport. Primary RN aware.

## 2024-10-10 NOTE — Progress Notes (Signed)
 Discharge meds in a secure bag delivered to patient by this RN

## 2024-10-11 NOTE — Discharge Summary (Signed)
 Patient ID: April Lang MRN: [id number] DOB/AGE: 10-15-1940 84 y.o.  Admit date: 10/09/2024 Discharge date: 10/10/2024  Admission Diagnoses:  Principal Problem:   OA (osteoarthritis) of knee Active Problems:   Primary osteoarthritis of left knee   Discharge Diagnoses:  Same  Past Medical History:  Diagnosis Date   Arthritis    Asthma    Complication of anesthesia    surgery in 2023 - pt got stuck 5 times for spinal   Coronary artery disease    Cystocele 01/19/2013   Difficult intubation    during 02/2022 knee surgery   GERD (gastroesophageal reflux disease)    Hypertension    Hypothyroidism    PONV (postoperative nausea and vomiting)    Stress incontinence 01/19/2013   Thyroid  disease     Surgeries: Procedures: ARTHROPLASTY, KNEE, TOTAL on 10/09/2024   Consultants:   Discharged Condition: Improved  Hospital Course: April Lang is an 84 y.o. female who was admitted 10/09/2024 for operative treatment ofOA (osteoarthritis) of knee. Patient has severe unremitting pain that affects sleep, daily activities, and work/hobbies. After pre-op clearance the patient was taken to the operating room on 10/09/2024 and underwent  Procedures: ARTHROPLASTY, KNEE, TOTAL.    Patient was given perioperative antibiotics:  Anti-infectives (From admission, onward)    Start     Dose/Rate Route Frequency Ordered Stop   10/09/24 1800  ceFAZolin  (ANCEF ) IVPB 2g/100 mL premix        2 g 200 mL/hr over 30 Minutes Intravenous Every 6 hours 10/09/24 1422 10/10/24 0007   10/09/24 1000  ceFAZolin  (ANCEF ) IVPB 2g/100 mL premix        2 g 200 mL/hr over 30 Minutes Intravenous On call to O.R. 10/09/24 0949 10/09/24 1124        Patient was given sequential compression devices, early ambulation, and chemoprophylaxis to prevent DVT.  Patient benefited maximally from hospital stay and there were no complications.    Recent vital signs: Patient Vitals for the past 24 hrs:  BP Temp Pulse Resp SpO2   10/10/24 1017 (!) 154/58 98 F (36.7 C) 72 16 100 %     Recent laboratory studies:  Recent Labs    10/10/24 0323  WBC 14.4*  HGB 11.2*  HCT 34.5*  PLT 244  NA 135  K 4.7  CL 99  CO2 23  BUN 16  CREATININE 0.98  GLUCOSE 148*  CALCIUM  9.0     Discharge Medications:   Allergies as of 10/10/2024       Reactions   Benadryl  [diphenhydramine ]    Dizziness    Codeine    Almost passed out.    Gemtesa [vibegron] Nausea And Vomiting, Other (See Comments)   Felt like she would die   Lisinopril Cough   Penicillins Other (See Comments)   Tolerated Cephalosporin Date: 10/10/24.   Other Rash   Arthritis med-not sure of name        Medication List     TAKE these medications    acetaminophen  500 MG tablet Commonly known as: TYLENOL  Take 500-1,000 mg by mouth every 6 (six) hours as needed for pain or moderate pain (pain score 4-6).   Aspirin  Low Dose 81 MG chewable tablet Generic drug: aspirin  Chew 1 tablet (81 mg total) by mouth 2 (two) times daily for 21 days. Then take one 81 mg aspirin  once a day for three weeks. Then discontinue aspirin .   EPINEPHrine  0.3 mg/0.3 mL Soaj injection Commonly known as: EPI-PEN Inject 0.3 mg into  the muscle as needed for anaphylaxis.   levothyroxine  100 MCG tablet Commonly known as: SYNTHROID  Take 1 tablet (100 mcg total) by mouth daily before breakfast.   losartan  50 MG tablet Commonly known as: COZAAR  Take 1 tablet (50 mg total) by mouth daily. What changed: when to take this   methocarbamol  500 MG tablet Commonly known as: ROBAXIN  Take 1 tablet (500 mg total) by mouth every 6 (six) hours as needed for muscle spasms.   ondansetron  4 MG tablet Commonly known as: ZOFRAN  Take 1 tablet (4 mg total) by mouth every 6 (six) hours as needed for nausea.   oxyCODONE  5 MG immediate release tablet Commonly known as: Oxy IR/ROXICODONE  Take 1 tablet (5 mg total) by mouth every 4 (four) hours as needed for severe pain (pain score  7-10).   traMADol  50 MG tablet Commonly known as: ULTRAM  Take 1-2 tablets (50-100 mg total) by mouth every 6 (six) hours as needed for moderate pain (pain score 4-6).               Discharge Care Instructions  (From admission, onward)           Start     Ordered   10/10/24 0000  Weight bearing as tolerated        10/10/24 0857   10/10/24 0000  Change dressing       Comments: You may remove the bulky bandage (ACE wrap and gauze) two days after surgery. You will have an adhesive waterproof bandage underneath. Leave this in place until your first follow-up appointment.   10/10/24 0857            Diagnostic Studies: No results found.  Disposition: Discharge disposition: 01-Home or Self Care       Discharge Instructions     Call MD / Call 911   Complete by: As directed    If you experience chest pain or shortness of breath, CALL 911 and be transported to the hospital emergency room.  If you develope a fever above 101 F, pus (white drainage) or increased drainage or redness at the wound, or calf pain, call your surgeon's office.   Change dressing   Complete by: As directed    You may remove the bulky bandage (ACE wrap and gauze) two days after surgery. You will have an adhesive waterproof bandage underneath. Leave this in place until your first follow-up appointment.   Constipation Prevention   Complete by: As directed    Drink plenty of fluids.  Prune juice may be helpful.  You may use a stool softener, such as Colace (over the counter) 100 mg twice a day.  Use MiraLax  (over the counter) for constipation as needed.   Diet - low sodium heart healthy   Complete by: As directed    Do not put a pillow under the knee. Place it under the heel.   Complete by: As directed    Driving restrictions   Complete by: As directed    No driving for two weeks   Post-operative opioid taper instructions:   Complete by: As directed    POST-OPERATIVE OPIOID TAPER INSTRUCTIONS: It  is important to wean off of your opioid medication as soon as possible. If you do not need pain medication after your surgery it is ok to stop day one. Opioids include: Codeine, Hydrocodone (Norco, Vicodin), Oxycodone (Percocet, oxycontin ) and hydromorphone  amongst others.  Long term and even short term use of opiods can cause: Increased pain response Dependence Constipation Depression Respiratory  depression And more.  Withdrawal symptoms can include Flu like symptoms Nausea, vomiting And more Techniques to manage these symptoms Hydrate well Eat regular healthy meals Stay active Use relaxation techniques(deep breathing, meditating, yoga) Do Not substitute Alcohol to help with tapering If you have been on opioids for less than two weeks and do not have pain than it is ok to stop all together.  Plan to wean off of opioids This plan should start within one week post op of your joint replacement. Maintain the same interval or time between taking each dose and first decrease the dose.  Cut the total daily intake of opioids by one tablet each day Next start to increase the time between doses. The last dose that should be eliminated is the evening dose.      TED hose   Complete by: As directed    Use stockings (TED hose) for three weeks on both leg(s).  You may remove them at night for sleeping.   Weight bearing as tolerated   Complete by: As directed         Follow-up Information     Melodi Lerner, MD. Schedule an appointment as soon as possible for a visit on 10/24/2024.   Specialty: Orthopedic Surgery Why: You are scheduled for a post op appointment on Tuesday 10/24/24 at 1:50pm Contact information: [address] [place] [address] [phone]                  Signed: Roxie Mess 10/11/2024, 10:10 AM

## 2024-10-20 ENCOUNTER — Ambulatory Visit: Admitting: Physician Assistant

## 2024-11-09 ENCOUNTER — Ambulatory Visit
